# Patient Record
Sex: Male | Born: 1940 | Race: White | Hispanic: No | Marital: Married | Smoking: Never smoker
Health system: Southern US, Community
[De-identification: ages and names within clinical notes are randomized; demographics above are authoritative.]

## PROBLEM LIST (undated history)

## (undated) DIAGNOSIS — G8929 Other chronic pain: Secondary | ICD-10-CM

## (undated) DIAGNOSIS — C801 Malignant (primary) neoplasm, unspecified: Secondary | ICD-10-CM

## (undated) DIAGNOSIS — Z9989 Dependence on other enabling machines and devices: Secondary | ICD-10-CM

## (undated) DIAGNOSIS — K219 Gastro-esophageal reflux disease without esophagitis: Secondary | ICD-10-CM

## (undated) DIAGNOSIS — M549 Dorsalgia, unspecified: Secondary | ICD-10-CM

## (undated) DIAGNOSIS — F329 Major depressive disorder, single episode, unspecified: Secondary | ICD-10-CM

## (undated) DIAGNOSIS — F32A Depression, unspecified: Secondary | ICD-10-CM

## (undated) DIAGNOSIS — I10 Essential (primary) hypertension: Secondary | ICD-10-CM

## (undated) DIAGNOSIS — E119 Type 2 diabetes mellitus without complications: Secondary | ICD-10-CM

## (undated) DIAGNOSIS — Z8669 Personal history of other diseases of the nervous system and sense organs: Secondary | ICD-10-CM

## (undated) DIAGNOSIS — E785 Hyperlipidemia, unspecified: Secondary | ICD-10-CM

## (undated) DIAGNOSIS — R011 Cardiac murmur, unspecified: Secondary | ICD-10-CM

## (undated) DIAGNOSIS — R600 Localized edema: Secondary | ICD-10-CM

## (undated) DIAGNOSIS — N400 Enlarged prostate without lower urinary tract symptoms: Secondary | ICD-10-CM

## (undated) DIAGNOSIS — D649 Anemia, unspecified: Secondary | ICD-10-CM

## (undated) DIAGNOSIS — G4733 Obstructive sleep apnea (adult) (pediatric): Secondary | ICD-10-CM

## (undated) HISTORY — PX: CARPAL TUNNEL RELEASE: SHX101

## (undated) HISTORY — PX: TONSILLECTOMY: SUR1361

## (undated) HISTORY — PX: ROTATOR CUFF REPAIR: SHX139

## (undated) HISTORY — PX: BACK SURGERY: SHX140

## (undated) HISTORY — PX: POLYPECTOMY: SHX149

---

## 2007-07-08 ENCOUNTER — Encounter: Admission: RE | Admit: 2007-07-08 | Discharge: 2007-07-08 | Payer: Self-pay | Admitting: Gastroenterology

## 2008-11-10 ENCOUNTER — Encounter: Admission: RE | Admit: 2008-11-10 | Discharge: 2008-11-10 | Payer: Self-pay | Admitting: Internal Medicine

## 2008-12-28 ENCOUNTER — Emergency Department (HOSPITAL_COMMUNITY): Admission: EM | Admit: 2008-12-28 | Discharge: 2008-12-28 | Payer: Self-pay | Admitting: Emergency Medicine

## 2009-04-21 ENCOUNTER — Inpatient Hospital Stay (HOSPITAL_COMMUNITY): Admission: RE | Admit: 2009-04-21 | Discharge: 2009-04-22 | Payer: Self-pay | Admitting: Neurosurgery

## 2009-11-24 ENCOUNTER — Ambulatory Visit: Payer: Self-pay | Admitting: Vascular Surgery

## 2009-12-07 ENCOUNTER — Ambulatory Visit: Payer: Self-pay | Admitting: Vascular Surgery

## 2010-02-01 ENCOUNTER — Encounter
Admission: RE | Admit: 2010-02-01 | Discharge: 2010-02-01 | Payer: Self-pay | Source: Home / Self Care | Attending: Neurosurgery | Admitting: Neurosurgery

## 2010-04-18 LAB — CBC
HCT: 40.4 % (ref 39.0–52.0)
Hemoglobin: 13.8 g/dL (ref 13.0–17.0)
MCHC: 34 g/dL (ref 30.0–36.0)
MCV: 91.6 fL (ref 78.0–100.0)
Platelets: 275 10*3/uL (ref 150–400)
RBC: 4.42 MIL/uL (ref 4.22–5.81)
RDW: 13 % (ref 11.5–15.5)
WBC: 8.9 10*3/uL (ref 4.0–10.5)

## 2010-04-18 LAB — BASIC METABOLIC PANEL
BUN: 19 mg/dL (ref 6–23)
CO2: 26 mEq/L (ref 19–32)
Calcium: 8.8 mg/dL (ref 8.4–10.5)
Chloride: 104 mEq/L (ref 96–112)
Creatinine, Ser: 1.1 mg/dL (ref 0.4–1.5)
GFR calc Af Amer: >60 mL/min (ref >60)
GFR calc non Af Amer: >60 mL/min (ref >60)
Glucose, Bld: 113 mg/dL — ABNORMAL HIGH (ref 70–99)
Potassium: 4.1 mEq/L (ref 3.5–5.1)
Sodium: 139 mEq/L (ref 135–145)

## 2010-04-18 LAB — SURGICAL PCR SCREEN
MRSA, PCR: POSITIVE — AB
Staphylococcus aureus: POSITIVE — AB

## 2010-06-07 NOTE — Procedures (Signed)
LOWER EXTREMITY VENOUS REFLUX EXAM   INDICATION:  Varicose veins.   EXAM:  Using color-flow imaging and pulse Doppler spectral analysis, the  right common femoral, superficial femoral, popliteal, posterior tibial,  greater and lesser saphenous veins are evaluated.  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   The right saphenofemoral junction is competent.  The right GSV is  competent with the caliber as described below.   The right proximal short saphenous vein demonstrates competency.   GSV Diameter (used if found to be incompetent only)                                            Right    Left  Proximal Greater Saphenous Vein           0.39 cm  cm  Proximal-to-mid-thigh                     cm       cm  Mid thigh                                 0.19 cm  cm  Mid-distal thigh                          cm       cm  Distal thigh                              16 cm    cm  Knee                                      cm       cm   IMPRESSION:  1. Right greater saphenous vein is competent.  2. The right greater saphenous vein is not tortuous.  3. The deep venous system is not competent with reflux of      >552milliseconds within the superficial femoral vein proximally.  4. The right short saphenous vein is competent.   ___________________________________________  Larina Earthly, M.D.   OD/MEDQ  D:  11/24/2009  T:  11/24/2009  Job:  [id number]

## 2010-06-07 NOTE — Consult Note (Signed)
NEW PATIENT CONSULTATION   Antonio [name], Antonio [name]  DOB:  04-Oct-1940                                       12/07/2009  [id number]#:[id number]   The patient presents today for evaluation of right leg swelling.  He is  an otherwise healthy 70 year old gentleman who reports a several-month  history of progressive swelling in his right leg.  This is from his knee  down into his foot and reports this was much worse in the summertime and  actually had difficulty in tying his shoe due to the swelling.  He has  had some improvement since this time.  He is not on any diuretic  therapy.  He does not have any history of DVT.  He has some aching  sensation related to this but no specific pain.   PAST MEDICAL HISTORY:  Negative for cardiac disease.  He does have  elevated cholesterol.  He does have a history of lower extremity disk  disease and has had injections in his back for some relief of this.  He  does report that when he walks he describes a foot drop with walking.   SOCIAL HISTORY:  He is retired.  He is married.  He does not smoke or  drink alcohol.   REVIEW OF SYSTEMS:  No weight loss or gain, weighs 165 pounds.  He is 5  feet 1-1/2 inch tall.  He does have history of depression and otherwise  negative except for HPI.   PHYSICAL EXAM:  Well-nourished white male appearing stated age in no  acute distress.  Blood pressure is 161/84, heart rate 78, respirations  18.  HEENT:  Normal.  Chest:  Clear bilaterally.  His dorsalis pedis  pulses and posterior tibial pulses are 2+ bilaterally.  He does have  some mild pitting edema in the right leg, mild in the left leg.  He has  some small reticular varicosities in his right posterior calf.  Otherwise no varices.   He underwent noninvasive vascular __________  in our office on 11/02 and  I reviewed this with Mr. Breeding.  This shows no evidence of  superficial incompetence.  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  I discussed this at length with Mr. 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  I explained that with the deep system involvement that  there is no specific treatment.  I did explain the importance of  elevation and potential use for compression.  He reports that the  swelling is not significant enough that he wishes to proceed with this  at this time.  He understands that this is an option and we would  recommend knee-high, 20-30 mm graduated compression stockings should he  seek treatment for this.  Otherwise, he will see [place] on an as-needed  basis.     Larina Earthly, M.D.  Electronically Signed   TFE/MEDQ  D:  12/07/2009  T:  12/08/2009  Job:  [id number]   cc:   Theressa Millard, M.D.

## 2011-08-17 ENCOUNTER — Other Ambulatory Visit: Payer: Self-pay | Admitting: Neurosurgery

## 2011-08-17 DIAGNOSIS — M545 Low back pain, unspecified: Secondary | ICD-10-CM

## 2011-08-22 ENCOUNTER — Ambulatory Visit
Admission: RE | Admit: 2011-08-22 | Discharge: 2011-08-22 | Disposition: A | Discharge status: Home / Self Care | Payer: Medicare Other | Source: Ambulatory Visit | Attending: Neurosurgery | Admitting: Neurosurgery

## 2011-08-22 DIAGNOSIS — M545 Low back pain, unspecified: Secondary | ICD-10-CM

## 2011-08-22 MED ORDER — GADOBENATE DIMEGLUMINE 529 MG/ML IV SOLN
15.0000 mL | Freq: Once | INTRAVENOUS | Status: AC | PRN
  Administered 2011-08-22: 15 mL via INTRAVENOUS

## 2011-09-05 ENCOUNTER — Encounter (HOSPITAL_COMMUNITY): Payer: Self-pay

## 2011-09-05 ENCOUNTER — Encounter (HOSPITAL_COMMUNITY): Payer: Self-pay | Admitting: Respiratory Therapy

## 2011-09-05 ENCOUNTER — Other Ambulatory Visit: Payer: Self-pay | Admitting: Neurosurgery

## 2011-09-05 NOTE — Progress Notes (Signed)
Contacted Dr. Earl Gala office, left message for Darl Pikes preop orders needed.

## 2011-09-06 ENCOUNTER — Inpatient Hospital Stay (HOSPITAL_COMMUNITY): Payer: Medicare Other

## 2011-09-06 ENCOUNTER — Encounter (HOSPITAL_COMMUNITY): Payer: Self-pay | Admitting: Anesthesiology

## 2011-09-06 ENCOUNTER — Inpatient Hospital Stay (HOSPITAL_COMMUNITY)
Admission: RE | Admit: 2011-09-06 | Discharge: 2011-09-08 | DRG: 491 | Disposition: A | Discharge status: Home / Self Care | Payer: Medicare Other | Source: Ambulatory Visit | Attending: Neurosurgery | Admitting: Neurosurgery

## 2011-09-06 ENCOUNTER — Inpatient Hospital Stay (HOSPITAL_COMMUNITY): Payer: Medicare Other | Admitting: Anesthesiology

## 2011-09-06 ENCOUNTER — Encounter (HOSPITAL_COMMUNITY)
Admission: RE | Disposition: A | Discharge status: Home / Self Care | Payer: Self-pay | Source: Ambulatory Visit | Attending: Neurosurgery

## 2011-09-06 DIAGNOSIS — Z85828 Personal history of other malignant neoplasm of skin: Secondary | ICD-10-CM

## 2011-09-06 DIAGNOSIS — F3289 Other specified depressive episodes: Secondary | ICD-10-CM | POA: Diagnosis present

## 2011-09-06 DIAGNOSIS — M5137 Other intervertebral disc degeneration, lumbosacral region: Secondary | ICD-10-CM | POA: Diagnosis present

## 2011-09-06 DIAGNOSIS — M51379 Other intervertebral disc degeneration, lumbosacral region without mention of lumbar back pain or lower extremity pain: Secondary | ICD-10-CM | POA: Diagnosis present

## 2011-09-06 DIAGNOSIS — K219 Gastro-esophageal reflux disease without esophagitis: Secondary | ICD-10-CM | POA: Diagnosis present

## 2011-09-06 DIAGNOSIS — M47817 Spondylosis without myelopathy or radiculopathy, lumbosacral region: Secondary | ICD-10-CM | POA: Diagnosis present

## 2011-09-06 DIAGNOSIS — Z85820 Personal history of malignant melanoma of skin: Secondary | ICD-10-CM

## 2011-09-06 DIAGNOSIS — M5126 Other intervertebral disc displacement, lumbar region: Principal | ICD-10-CM | POA: Diagnosis present

## 2011-09-06 DIAGNOSIS — F329 Major depressive disorder, single episode, unspecified: Secondary | ICD-10-CM | POA: Diagnosis present

## 2011-09-06 HISTORY — DX: Major depressive disorder, single episode, unspecified: F32.9

## 2011-09-06 HISTORY — DX: Malignant (primary) neoplasm, unspecified: C80.1

## 2011-09-06 HISTORY — DX: Other chronic pain: G89.29

## 2011-09-06 HISTORY — DX: Depression, unspecified: F32.A

## 2011-09-06 HISTORY — DX: Dorsalgia, unspecified: M54.9

## 2011-09-06 HISTORY — PX: LUMBAR LAMINECTOMY/DECOMPRESSION MICRODISCECTOMY: SHX5026

## 2011-09-06 HISTORY — DX: Gastro-esophageal reflux disease without esophagitis: K21.9

## 2011-09-06 HISTORY — DX: Hyperlipidemia, unspecified: E78.5

## 2011-09-06 LAB — CBC
HCT: 24.1 % — ABNORMAL LOW (ref 39.0–52.0)
Hemoglobin: 7.4 g/dL — ABNORMAL LOW (ref 13.0–17.0)
MCH: 24.3 pg — ABNORMAL LOW (ref 26.0–34.0)
MCHC: 30.7 g/dL (ref 30.0–36.0)
MCV: 79.3 fL (ref 78.0–100.0)
Platelets: 286 10*3/uL (ref 150–400)
RBC: 3.04 MIL/uL — ABNORMAL LOW (ref 4.22–5.81)
RDW: 15.7 % — ABNORMAL HIGH (ref 11.5–15.5)
WBC: 8.5 10*3/uL (ref 4.0–10.5)

## 2011-09-06 LAB — SURGICAL PCR SCREEN
MRSA, PCR: NEGATIVE
Staphylococcus aureus: POSITIVE — AB

## 2011-09-06 SURGERY — LUMBAR LAMINECTOMY/DECOMPRESSION MICRODISCECTOMY 1 LEVEL
Anesthesia: General | Site: Back | Laterality: Left | Wound class: Clean

## 2011-09-06 MED ORDER — GLYCOPYRROLATE 0.2 MG/ML IJ SOLN
INTRAMUSCULAR | Status: DC | PRN
  Administered 2011-09-06: 0.2 mg via INTRAVENOUS
  Administered 2011-09-06: .3 mg via INTRAVENOUS

## 2011-09-06 MED ORDER — EPHEDRINE SULFATE 50 MG/ML IJ SOLN: INTRAMUSCULAR | Status: DC | PRN

## 2011-09-06 MED ORDER — ALUM & MAG HYDROXIDE-SIMETH 200-200-20 MG/5ML PO SUSP: 30.0000 mL | Freq: Four times a day (QID) | ORAL | Status: DC | PRN

## 2011-09-06 MED ORDER — PHENOL 1.4 % MT LIQD: 1.0000 | OROMUCOSAL | Status: DC | PRN

## 2011-09-06 MED ORDER — 0.9 % SODIUM CHLORIDE (POUR BTL) OPTIME
TOPICAL | Status: DC | PRN
  Administered 2011-09-06: 1000 mL

## 2011-09-06 MED ORDER — HYDROXYZINE HCL 50 MG/ML IM SOLN
50.0000 mg | INTRAMUSCULAR | Status: DC | PRN
  Administered 2011-09-06: 50 mg via INTRAMUSCULAR
  Filled 2011-09-06: qty 1

## 2011-09-06 MED ORDER — PROPOFOL 10 MG/ML IV EMUL
INTRAVENOUS | Status: DC | PRN
  Administered 2011-09-06: 200 mg via INTRAVENOUS

## 2011-09-06 MED ORDER — MORPHINE SULFATE 4 MG/ML IJ SOLN: 4.0000 mg | INTRAMUSCULAR | Status: DC | PRN

## 2011-09-06 MED ORDER — BUPIVACAINE HCL (PF) 0.5 % IJ SOLN
INTRAMUSCULAR | Status: DC | PRN
  Administered 2011-09-06: 10 mL

## 2011-09-06 MED ORDER — ONDANSETRON HCL 4 MG/2ML IJ SOLN: 4.0000 mg | Freq: Once | INTRAMUSCULAR | Status: DC | PRN

## 2011-09-06 MED ORDER — CYCLOBENZAPRINE HCL 10 MG PO TABS: 10.0000 mg | ORAL_TABLET | Freq: Three times a day (TID) | ORAL | Status: DC | PRN

## 2011-09-06 MED ORDER — ROCURONIUM BROMIDE 100 MG/10ML IV SOLN
INTRAVENOUS | Status: DC | PRN
  Administered 2011-09-06: 50 mg via INTRAVENOUS

## 2011-09-06 MED ORDER — OXYCODONE-ACETAMINOPHEN 5-325 MG PO TABS: 1.0000 | ORAL_TABLET | ORAL | Status: DC | PRN

## 2011-09-06 MED ORDER — TAMSULOSIN HCL 0.4 MG PO CAPS
0.4000 mg | ORAL_CAPSULE | ORAL | Status: AC
  Administered 2011-09-06: 0.4 mg via ORAL
  Filled 2011-09-06: qty 1

## 2011-09-06 MED ORDER — ACETAMINOPHEN 10 MG/ML IV SOLN
INTRAVENOUS | Status: AC
  Filled 2011-09-06: qty 100

## 2011-09-06 MED ORDER — KETOROLAC TROMETHAMINE 30 MG/ML IJ SOLN
INTRAMUSCULAR | Status: AC
  Filled 2011-09-06: qty 1

## 2011-09-06 MED ORDER — HEMOSTATIC AGENTS (NO CHARGE) OPTIME
TOPICAL | Status: DC | PRN
  Administered 2011-09-06: 1 via TOPICAL

## 2011-09-06 MED ORDER — ACETAMINOPHEN 325 MG PO TABS: 650.0000 mg | ORAL_TABLET | ORAL | Status: DC | PRN

## 2011-09-06 MED ORDER — SODIUM CHLORIDE 0.9 % IJ SOLN: 3.0000 mL | INTRAMUSCULAR | Status: DC | PRN

## 2011-09-06 MED ORDER — PANTOPRAZOLE SODIUM 40 MG PO TBEC
40.0000 mg | DELAYED_RELEASE_TABLET | Freq: Every day | ORAL | Status: DC
  Administered 2011-09-06 – 2011-09-08 (×3): 40 mg via ORAL
  Filled 2011-09-06 (×4): qty 1

## 2011-09-06 MED ORDER — OXYCODONE HCL 5 MG PO TABS: 5.0000 mg | ORAL_TABLET | ORAL | Status: DC | PRN

## 2011-09-06 MED ORDER — TAMSULOSIN HCL 0.4 MG PO CAPS
0.4000 mg | ORAL_CAPSULE | Freq: Every day | ORAL | Status: DC
  Administered 2011-09-07 – 2011-09-08 (×2): 0.4 mg via ORAL
  Filled 2011-09-06 (×3): qty 1

## 2011-09-06 MED ORDER — DEXTROSE 5 % IV SOLN
INTRAVENOUS | Status: DC | PRN
  Administered 2011-09-06: 13:00:00 via INTRAVENOUS

## 2011-09-06 MED ORDER — LACTATED RINGERS IV SOLN
INTRAVENOUS | Status: DC | PRN
  Administered 2011-09-06 (×2): via INTRAVENOUS

## 2011-09-06 MED ORDER — FENTANYL CITRATE 0.05 MG/ML IJ SOLN
INTRAMUSCULAR | Status: AC
  Filled 2011-09-06: qty 2

## 2011-09-06 MED ORDER — ZOLPIDEM TARTRATE 5 MG PO TABS: 5.0000 mg | ORAL_TABLET | Freq: Every evening | ORAL | Status: DC | PRN

## 2011-09-06 MED ORDER — LIDOCAINE-EPINEPHRINE 1 %-1:100000 IJ SOLN
INTRAMUSCULAR | Status: DC | PRN
  Administered 2011-09-06: 10 mL

## 2011-09-06 MED ORDER — EPHEDRINE SULFATE 50 MG/ML IJ SOLN
INTRAMUSCULAR | Status: DC | PRN
  Administered 2011-09-06 (×4): 5 mg via INTRAVENOUS

## 2011-09-06 MED ORDER — BACITRACIN 50000 UNITS IM SOLR
INTRAMUSCULAR | Status: AC
  Filled 2011-09-06: qty 1

## 2011-09-06 MED ORDER — CEFAZOLIN SODIUM-DEXTROSE 2-3 GM-% IV SOLR
INTRAVENOUS | Status: AC
  Filled 2011-09-06: qty 50

## 2011-09-06 MED ORDER — SODIUM CHLORIDE 0.9 % IR SOLN
Status: DC | PRN
  Administered 2011-09-06: 12:00:00

## 2011-09-06 MED ORDER — MENTHOL 3 MG MT LOZG: 1.0000 | LOZENGE | OROMUCOSAL | Status: DC | PRN

## 2011-09-06 MED ORDER — FENTANYL CITRATE 0.05 MG/ML IJ SOLN
INTRAMUSCULAR | Status: DC | PRN
  Administered 2011-09-06: 100 ug via INTRAVENOUS

## 2011-09-06 MED ORDER — ONDANSETRON HCL 4 MG/2ML IJ SOLN
INTRAMUSCULAR | Status: DC | PRN
  Administered 2011-09-06: 4 mg via INTRAVENOUS

## 2011-09-06 MED ORDER — KETOROLAC TROMETHAMINE 30 MG/ML IJ SOLN
15.0000 mg | Freq: Once | INTRAMUSCULAR | Status: AC
  Administered 2011-09-06: 15 mg via INTRAVENOUS

## 2011-09-06 MED ORDER — MUPIROCIN 2 % EX OINT
TOPICAL_OINTMENT | Freq: Two times a day (BID) | CUTANEOUS | Status: DC
  Administered 2011-09-06 – 2011-09-08 (×4): via NASAL
  Filled 2011-09-06: qty 22

## 2011-09-06 MED ORDER — ACETAMINOPHEN 10 MG/ML IV SOLN
1000.0000 mg | Freq: Four times a day (QID) | INTRAVENOUS | Status: AC
  Administered 2011-09-06 – 2011-09-07 (×4): 1000 mg via INTRAVENOUS
  Filled 2011-09-06 (×4): qty 100

## 2011-09-06 MED ORDER — HYDROCODONE-ACETAMINOPHEN 5-325 MG PO TABS: 1.0000 | ORAL_TABLET | ORAL | Status: DC | PRN

## 2011-09-06 MED ORDER — HYDROMORPHONE HCL PF 1 MG/ML IJ SOLN: 0.2500 mg | INTRAMUSCULAR | Status: DC | PRN

## 2011-09-06 MED ORDER — SODIUM CHLORIDE 0.9 % IV SOLN
INTRAVENOUS | Status: AC
  Filled 2011-09-06: qty 500

## 2011-09-06 MED ORDER — MUPIROCIN 2 % EX OINT
TOPICAL_OINTMENT | CUTANEOUS | Status: AC
  Administered 2011-09-06: 1 via NASAL
  Filled 2011-09-06: qty 22

## 2011-09-06 MED ORDER — MAGNESIUM HYDROXIDE 400 MG/5ML PO SUSP: 30.0000 mL | Freq: Every day | ORAL | Status: DC | PRN

## 2011-09-06 MED ORDER — VANCOMYCIN HCL 1000 MG IV SOLR
1000.0000 mg | INTRAVENOUS | Status: DC | PRN
  Administered 2011-09-06: 1000 mg via INTRAVENOUS

## 2011-09-06 MED ORDER — MUPIROCIN 2 % EX OINT
TOPICAL_OINTMENT | CUTANEOUS | Status: AC
  Filled 2011-09-06: qty 22

## 2011-09-06 MED ORDER — CEFAZOLIN SODIUM-DEXTROSE 2-3 GM-% IV SOLR
2.0000 g | INTRAVENOUS | Status: AC
  Administered 2011-09-06: 2 g via INTRAVENOUS

## 2011-09-06 MED ORDER — KETOROLAC TROMETHAMINE 30 MG/ML IJ SOLN
15.0000 mg | Freq: Four times a day (QID) | INTRAMUSCULAR | Status: DC
  Administered 2011-09-06 – 2011-09-08 (×7): 15 mg via INTRAVENOUS
  Filled 2011-09-06 (×10): qty 1

## 2011-09-06 MED ORDER — KCL IN DEXTROSE-NACL 20-5-0.45 MEQ/L-%-% IV SOLN
INTRAVENOUS | Status: DC
  Administered 2011-09-06: 16:00:00 via INTRAVENOUS
  Filled 2011-09-06 (×6): qty 1000

## 2011-09-06 MED ORDER — FENTANYL CITRATE 0.05 MG/ML IJ SOLN
INTRAMUSCULAR | Status: AC
  Administered 2011-09-06: 50 ug
  Filled 2011-09-06: qty 2

## 2011-09-06 MED ORDER — SODIUM CHLORIDE 0.9 % IJ SOLN
3.0000 mL | Freq: Two times a day (BID) | INTRAMUSCULAR | Status: DC
  Administered 2011-09-06 – 2011-09-08 (×5): 3 mL via INTRAVENOUS

## 2011-09-06 MED ORDER — LIDOCAINE HCL (CARDIAC) 20 MG/ML IV SOLN
INTRAVENOUS | Status: DC | PRN
  Administered 2011-09-06: 100 mg via INTRAVENOUS

## 2011-09-06 MED ORDER — BUPROPION HCL ER (XL) 300 MG PO TB24
450.0000 mg | ORAL_TABLET | Freq: Every day | ORAL | Status: DC
  Administered 2011-09-07 – 2011-09-08 (×2): 450 mg via ORAL
  Filled 2011-09-06 (×3): qty 1

## 2011-09-06 MED ORDER — HYDROXYZINE HCL 25 MG PO TABS: 50.0000 mg | ORAL_TABLET | ORAL | Status: DC | PRN

## 2011-09-06 MED ORDER — ACETAMINOPHEN 650 MG RE SUPP: 650.0000 mg | RECTAL | Status: DC | PRN

## 2011-09-06 MED ORDER — NEOSTIGMINE METHYLSULFATE 1 MG/ML IJ SOLN
INTRAMUSCULAR | Status: DC | PRN
  Administered 2011-09-06: 1 mg via INTRAVENOUS
  Administered 2011-09-06: 2 mg via INTRAVENOUS

## 2011-09-06 MED ORDER — VANCOMYCIN HCL IN DEXTROSE 1-5 GM/200ML-% IV SOLN
INTRAVENOUS | Status: AC
  Filled 2011-09-06: qty 200

## 2011-09-06 MED ORDER — BISACODYL 10 MG RE SUPP: 10.0000 mg | Freq: Every day | RECTAL | Status: DC | PRN

## 2011-09-06 MED ORDER — THROMBIN 5000 UNITS EX SOLR
CUTANEOUS | Status: DC | PRN
  Administered 2011-09-06 (×2): 5000 [IU] via TOPICAL

## 2011-09-06 MED ORDER — METHYLPREDNISOLONE ACETATE 80 MG/ML IJ SUSP
INTRAMUSCULAR | Status: DC | PRN
  Administered 2011-09-06: 80 mg

## 2011-09-06 SURGICAL SUPPLY — 61 items
ADH SKN CLS APL DERMABOND .7 (GAUZE/BANDAGES/DRESSINGS) ×1
APL SKNCLS STERI-STRIP NONHPOA (GAUZE/BANDAGES/DRESSINGS)
BAG DECANTER FOR FLEXI CONT (MISCELLANEOUS) ×2 IMPLANT
BENZOIN TINCTURE PRP APPL 2/3 (GAUZE/BANDAGES/DRESSINGS) IMPLANT
BLADE SURG ROTATE 9660 (MISCELLANEOUS) IMPLANT
BRUSH SCRUB EZ PLAIN DRY (MISCELLANEOUS) ×2 IMPLANT
BUR ACORN 6.0 ACORN (BURR) IMPLANT
BUR ACRON 5.0MM COATED (BURR) IMPLANT
BUR MATCHSTICK NEURO 3.0 LAGG (BURR) ×2 IMPLANT
CANISTER SUCTION 2500CC (MISCELLANEOUS) ×2 IMPLANT
CLOTH BEACON ORANGE TIMEOUT ST (SAFETY) ×2 IMPLANT
CONT SPEC 4OZ CLIKSEAL STRL BL (MISCELLANEOUS) ×1 IMPLANT
DERMABOND ADVANCED (GAUZE/BANDAGES/DRESSINGS) ×1
DERMABOND ADVANCED .7 DNX12 (GAUZE/BANDAGES/DRESSINGS) IMPLANT
DRAPE LAPAROTOMY 100X72X124 (DRAPES) ×2 IMPLANT
DRAPE MICROSCOPE LEICA (MISCELLANEOUS) ×2 IMPLANT
DRAPE POUCH INSTRU U-SHP 10X18 (DRAPES) ×2 IMPLANT
DRAPE PROXIMA HALF (DRAPES) ×1 IMPLANT
DRSG EMULSION OIL 3X3 NADH (GAUZE/BANDAGES/DRESSINGS) IMPLANT
ELECT REM PT RETURN 9FT ADLT (ELECTROSURGICAL) ×2
ELECTRODE REM PT RTRN 9FT ADLT (ELECTROSURGICAL) ×1 IMPLANT
GAUZE SPONGE 4X4 16PLY XRAY LF (GAUZE/BANDAGES/DRESSINGS) IMPLANT
GLOVE BIOGEL PI IND STRL 7.0 (GLOVE) IMPLANT
GLOVE BIOGEL PI IND STRL 8 (GLOVE) ×1 IMPLANT
GLOVE BIOGEL PI INDICATOR 7.0 (GLOVE) ×1
GLOVE BIOGEL PI INDICATOR 8 (GLOVE) ×1
GLOVE ECLIPSE 7.5 STRL STRAW (GLOVE) ×2 IMPLANT
GLOVE EXAM NITRILE LRG STRL (GLOVE) IMPLANT
GLOVE EXAM NITRILE MD LF STRL (GLOVE) ×1 IMPLANT
GLOVE EXAM NITRILE XL STR (GLOVE) IMPLANT
GLOVE EXAM NITRILE XS STR PU (GLOVE) IMPLANT
GOWN BRE IMP SLV AUR LG STRL (GOWN DISPOSABLE) ×2 IMPLANT
GOWN BRE IMP SLV AUR XL STRL (GOWN DISPOSABLE) ×1 IMPLANT
GOWN STRL REIN 2XL LVL4 (GOWN DISPOSABLE) ×1 IMPLANT
KIT BASIN OR (CUSTOM PROCEDURE TRAY) ×2 IMPLANT
KIT ROOM TURNOVER OR (KITS) ×2 IMPLANT
NDL HYPO 18GX1.5 BLUNT FILL (NEEDLE) IMPLANT
NDL SPNL 18GX3.5 QUINCKE PK (NEEDLE) ×1 IMPLANT
NDL SPNL 22GX3.5 QUINCKE BK (NEEDLE) IMPLANT
NEEDLE HYPO 18GX1.5 BLUNT FILL (NEEDLE) ×4 IMPLANT
NEEDLE SPNL 18GX3.5 QUINCKE PK (NEEDLE) ×4 IMPLANT
NEEDLE SPNL 22GX3.5 QUINCKE BK (NEEDLE) ×2 IMPLANT
NS IRRIG 1000ML POUR BTL (IV SOLUTION) ×2 IMPLANT
PACK LAMINECTOMY NEURO (CUSTOM PROCEDURE TRAY) ×2 IMPLANT
PAD ARMBOARD 7.5X6 YLW CONV (MISCELLANEOUS) ×6 IMPLANT
PATTIES SURGICAL .5 X1 (DISPOSABLE) ×2 IMPLANT
RUBBERBAND STERILE (MISCELLANEOUS) ×4 IMPLANT
SPONGE GAUZE 4X4 12PLY (GAUZE/BANDAGES/DRESSINGS) IMPLANT
SPONGE LAP 4X18 X RAY DECT (DISPOSABLE) IMPLANT
SPONGE SURGIFOAM ABS GEL SZ50 (HEMOSTASIS) ×2 IMPLANT
STRIP CLOSURE SKIN 1/2X4 (GAUZE/BANDAGES/DRESSINGS) IMPLANT
SUT PROLENE 6 0 BV (SUTURE) IMPLANT
SUT VIC AB 1 CT1 18XBRD ANBCTR (SUTURE) ×1 IMPLANT
SUT VIC AB 1 CT1 8-18 (SUTURE) ×2
SUT VIC AB 2-0 CP2 18 (SUTURE) ×2 IMPLANT
SUT VIC AB 3-0 SH 8-18 (SUTURE) IMPLANT
SYR 20CC LL (SYRINGE) ×2 IMPLANT
SYR 5ML LL (SYRINGE) ×1 IMPLANT
TOWEL OR 17X24 6PK STRL BLUE (TOWEL DISPOSABLE) ×2 IMPLANT
TOWEL OR 17X26 10 PK STRL BLUE (TOWEL DISPOSABLE) ×2 IMPLANT
WATER STERILE IRR 1000ML POUR (IV SOLUTION) ×2 IMPLANT

## 2011-09-06 NOTE — Progress Notes (Signed)
Filed Vitals:   09/06/11 1430 09/06/11 1553 09/06/11 1841 09/06/11 2002  BP: 196/98 187/76 151/77 157/72  Pulse: 106 75 82 88  Temp: 97.4 F (36.3 C) 97.4 F (36.3 C)  97.6 F (36.4 C)  TempSrc:      Resp: 16 16  18   Height:      Weight:      SpO2: 97% 96%  96%    CBC  Basename 09/06/11 0955  WBC 8.5  HGB 7.4*  HCT 24.1*  PLT 286    Patient resting comfortably in bed. Excellent relief of left lumbar radicular pain. Has ambulated in the halls. Was voiding small volumes this afternoon, bladder scan showed large-volume, and in and out catheterization for 1050 cc done in about 1630. Patient voided about 150 cc at 2200, bladder scan now shows 615 cc. Wound clean and dry. Good strength in lower extremities.   Plan: Patient with excellent relief of radicular pain following lumbar discectomy. However having difficulties with urinary retention, he may well have a tendency towards this prior to surgery. I've asked the nursing staff to place a Foley catheter to straight drainage to rest his bladder. We will give him a dose of Flomax tonight, and then begin every morning dosing. We'll reassess in a.m.   Hewitt Shorts, MD 09/06/2011, 10:37 PM

## 2011-09-06 NOTE — H&P (Signed)
Subjective: Patient is a 71 y.o. male who is admitted for treatment of an acute left lumbar radiculopathy secondary to an acute left L3-4 lumbar disc herniation that has extended caudally behind the body of L4 on the left side. We had been managed in the patient for low back pain, he had previously demonstrated stenosis at the L3-4 level, multifactorial in nature, as well as right L4-5 and right L5-S1 neural foraminal stenosis, secondary to spondylosis.  However he fell about a month ago landing on his left knee and since then has been having left lumbar radicular pain. He's restudied with MRI scan, 4 to the most notable finding was the new left L3-4 disc herniation, with a fragment migrated caudally behind the body of L4 on the left side.  He is admitted now for a left L3-4 lumbar laminotomy and microdiscectomy.   Past Medical History  Diagnosis Date  . Hyperlipidemia     "borderline"  . Depression   . GERD (gastroesophageal reflux disease)   . 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 Chronic back pain     Past Surgical History  Procedure Date  . Tonsillectomy   . Back surgery     No prescriptions prior to admission   No Known Allergies  History  Substance Use Topics  . Smoking status: Never Smoker   . Smokeless tobacco: Not on file  . Alcohol Use: No    History reviewed. No pertinent family history.   Review of Systems A comprehensive review of systems was negative.  Objective: Vital signs in last 24 hours: Weight:  [72.576 kg (160 lb)] 72.576 kg (160 lb) (08/13 1557)  EXAM: Patient is a well-developed well-nourished white male in no acute distress. Lungs are clear to auscultation , the patient has symmetrical respiratory excursion. Heart has a regular rate and rhythm normal S1 and S2 no murmur.   Abdomen is soft nontender nondistended bowel sounds are present. Extremity examination shows no clubbing cyanosis or edema. Musculoskeletal  examination shows no tenderness to palpation over the lumbar spinous processes or paralumbar musculature. He is able to flex to about 80 able to extend beyond 10. Straight leg raising is negative bilaterally. Motor examination shows 5 over 5 strength in the lower extremities including the iliopsoas quadriceps dorsiflexor extensor hallicus  longus and plantar flexor bilaterally. Sensation is intact to pinprick in the distal lower extremities. Reflexes are symmetrical bilaterally. No pathologic reflexes are present. Patient has a normal gait and stance.   Data Review:CBC    Component Value Date/Time   WBC 8.9 04/15/2009 1159   RBC 4.42 04/15/2009 1159   HGB 13.8 04/15/2009 1159   HCT 40.4 04/15/2009 1159   PLT 275 04/15/2009 1159   MCV 91.6 04/15/2009 1159   MCHC 34.0 04/15/2009 1159   RDW 13.0 04/15/2009 1159                          BMET    Component Value Date/Time   NA 139 04/15/2009 1159   K 4.1 04/15/2009 1159   CL 104 04/15/2009 1159   CO2 26 04/15/2009 1159   GLUCOSE 113* 04/15/2009 1159   BUN 19 04/15/2009 1159   CREATININE 1.10 04/15/2009 1159   CALCIUM 8.8 04/15/2009 1159   GFRNONAA >60 04/15/2009 1159   GFRAA  Value: >60        The eGFR has been calculated using the MDRD equation. This  calculation has not been validated in all clinical situations. eGFR's persistently <60 mL/min signify possible Chronic Kidney Disease. 04/15/2009 1159     Assessment/Plan: Patient with acute left lumbar radiculopathy secondary to an acute left L3-4 lumbar disc herniation with a fragment has migrated caudally behind the left side of the body of L4. Patient is admitted now for left L3-4 lumbar laminotomy and microdiscectomy. I've discussed with the patient the nature of his condition, the nature the surgical procedure, the typical length of surgery, hospital stay, and overall recuperation. We discussed limitations postoperatively. I discussed risks of surgery including risks of infection, bleeding, possibly  need for transfusion, the risk of nerve root dysfunction with pain, weakness, numbness, or paresthesias, or risk of dural tear and CSF leakage and possible need for further surgery, the risk of recurrent disc herniation and the possible need for further surgery, and the risk of anesthetic complications including myocardial infarction, stroke, pneumonia, and death. Understanding all this the patient does wish to proceed with surgery and is admitted for such.    Hewitt Shorts, MD 09/06/2011 8:33 AM

## 2011-09-06 NOTE — Anesthesia Postprocedure Evaluation (Signed)
  Anesthesia Post-op Note  Patient: Antonio [name]  Procedure(s) Performed: Procedure(s) (LRB): LUMBAR LAMINECTOMY/DECOMPRESSION MICRODISCECTOMY 1 LEVEL (Left)  Patient Location: PACU  Anesthesia Type: General  Level of Consciousness: awake, alert , oriented and patient cooperative  Airway and Oxygen Therapy: Patient Spontanous Breathing and Patient connected to nasal cannula oxygen  Post-op Pain: mild  Post-op Assessment: Post-op Vital signs reviewed, Patient's Cardiovascular Status Stable, Respiratory Function Stable, Patent Airway, No signs of Nausea or vomiting and Pain level controlled  Post-op Vital Signs: stable  Complications: No apparent anesthesia complications

## 2011-09-06 NOTE — Transfer of Care (Signed)
Immediate Anesthesia Transfer of Care Note  Patient: Antonio [name]  Procedure(s) Performed: Procedure(s) (LRB): LUMBAR LAMINECTOMY/DECOMPRESSION MICRODISCECTOMY 1 LEVEL (Left)  Patient Location: PACU  Anesthesia Type: General  Level of Consciousness: patient cooperative and responds to stimulation  Airway & Oxygen Therapy: Patient Spontanous Breathing and Patient connected to nasal cannula oxygen  Post-op Assessment: Report given to PACU RN and Post -op Vital signs reviewed and stable  Post vital signs: Reviewed and stable  Complications: No apparent anesthesia complications

## 2011-09-06 NOTE — Plan of Care (Signed)
Problem: Consults Goal: Diagnosis - Spinal Surgery Outcome: Completed/Met Date Met:  09/06/11 Lumbar Laminectomy (Complex)

## 2011-09-06 NOTE — Preoperative (Signed)
Beta Blockers   Reason not to administer Beta Blockers:Not Applicable 

## 2011-09-06 NOTE — Op Note (Signed)
09/06/2011  1:15 PM  PATIENT:  Antonio [name]  71 y.o. male  PRE-OPERATIVE DIAGNOSIS:  Left L3-4 Lumbar herniated nucleus pulposus without myelopathy, Lumbar stenosis, Lumbar spondylosis, Lumbar degenerative disc disease  POST-OPERATIVE DIAGNOSIS:  Left L34 Lumbar herniated nucleus pulposus without myelopathy, Lumbar stenosis, Lumbar spondylosis, Lumbar degenerative disc disease  PROCEDURE:  Procedure(s): LUMBAR LAMINECTOMY/DECOMPRESSION MICRODISCECTOMY 1 LEVEL: Left L3-4 lumbar laminotomy and microdiscectomy, with microdissection, microsurgical technique, and the operating microscope  SURGEON:  Surgeon(s): Hewitt Shorts, MD  ANESTHESIA:   general  EBL:  Total I/O In: 1300 [I.V.:1300] Out: -   COUNT: Correct per nursing staff  DICTATION: Patient was brought to the operating room and placed under general endotracheal anesthesia. Patient was turned to prone position the lumbar region was prepped with Betadine soap and solution and draped in a sterile fashion. The midline was infiltrated with local anesthetic with epinephrine. A localizing x-ray was taken and the L3-4 level was identified. Midline incision was made over the L3-4 level and was carried down through the subcutaneous tissue to the lumbar fascia. The lumbar fascia was incised on the left side and the paraspinal muscles were dissected from the spinous processes and lamina in a subperiosteal fashion. Another x-ray was taken and the left L3-4 intralaminar space was identified. The operating microscope was draped and brought into the field provided additional magnification, illumination, and visualization and the remainder the decompression was performed using microdissection and microsurgical technique. Laminotomy was performed using the high-speed drill and Kerrison punches. The ligamentum flavum was carefully resected. Spondylitic overgrowth and thickened ligamentum flavum was removed, decompressing the dorsal aspect of the  patient's pre-existing spinal stenosis on the left side at the L3-4 level. The underlying thecal sac and nerve root were identified. The disc herniation was identified and the thecal sac and nerve root gently retracted medially. The disc herniation was found to be a free fragment located inferior to the L3-4 disc space, behind the body of L4, directly compressing the thecal sac and the left L4 nerve root. The fragment was removed in a piecemeal fashion. All loose fragments of disc material were removed from the epidural space, and good decompression of the thecal sac and nerve root was achieved. We examined the annulus of the L3-4 disc, no obvious rent in the annulus was found, it was felt that there would be no further benefit from opening the annulus and entering the disc space to continue a more extensive intradiscal discectomy. Once the decompression was completed and good decompression of the thecal sac and nerve had been achieved hemostasis was established with the use of bipolar cautery and Gelfoam with thrombin. The Gelfoam was removed and hemostasis confirmed. We then instilled 2 cc of fentanyl and 80 mg of Depo-Medrol into the epidural space. Deep fascia was closed with interrupted undyed 1 Vicryl sutures. Scarpa's fascia was closed with interrupted undyed 1 Vicryl sutures in the subcutaneous and subcuticular layer were closed with interrupted inverted 2-0 undyed Vicryl sutures. The skin edges were approximated with Dermabond. Following surgery the patient was turned back to a supine position to be reversed from the anesthetic extubated and transferred to the recovery room for further care.   PLAN OF CARE: Admit to inpatient   PATIENT DISPOSITION:  PACU - hemodynamically stable.   Delay start of Pharmacological VTE agent (>24hrs) due to surgical blood loss or risk of bleeding:  yes

## 2011-09-06 NOTE — Progress Notes (Signed)
UR COMPLETED  

## 2011-09-06 NOTE — Anesthesia Preprocedure Evaluation (Addendum)
Anesthesia Evaluation  Patient identified by MRN, date of birth, ID band Patient awake    Reviewed: Allergy & Precautions, H&P , NPO status , Patient's Chart, lab work & pertinent test results  Airway Mallampati: II TM Distance: >3 FB Neck ROM: full    Dental  (+) Dental Advisory Given and Chipped,    Pulmonary          Cardiovascular Rhythm:regular Rate:Normal     Neuro/Psych    GI/Hepatic GERD-  ,  Endo/Other    Renal/GU      Musculoskeletal   Abdominal   Peds  Hematology   Anesthesia Other Findings   Reproductive/Obstetrics                          Anesthesia Physical Anesthesia Plan  ASA: I  Anesthesia Plan: General   Post-op Pain Management:    Induction: Intravenous  Airway Management Planned: Oral ETT  Additional Equipment:   Intra-op Plan:   Post-operative Plan: Extubation in OR  Informed Consent: I have reviewed the patients History and Physical, chart, labs and discussed the procedure including the risks, benefits and alternatives for the proposed anesthesia with the patient or authorized representative who has indicated his/her understanding and acceptance.     Plan Discussed with: CRNA, Anesthesiologist and Surgeon  Anesthesia Plan Comments:         Anesthesia Quick Evaluation

## 2011-09-07 ENCOUNTER — Encounter (HOSPITAL_COMMUNITY): Payer: Self-pay | Admitting: Neurosurgery

## 2011-09-07 NOTE — Progress Notes (Signed)
Filed Vitals:   09/06/11 2002 09/06/11 2345 09/07/11 0400 09/07/11 0815  BP: 157/72 179/70 127/69 156/71  Pulse: 88 82 83 79  Temp: 97.6 F (36.4 C) 98.9 F (37.2 C) 97.8 F (36.6 C) 98 F (36.7 C)  TempSrc:      Resp: 18 18 16 18   Height:      Weight:      SpO2: 96% 97% 94% 97%    Patient resting comfortably in bed. Wound clean and dry. Up and ambulating. Foley to straight drainage. Continues on Flomax.  Plan: Discussed alternatives of voiding trial tomorrow morning versus discharged to home leaving Foley in with a leg bag. Have decided to try voiding trial tomorrow, will continue on Flomax. Encouraged to ambulate.  Hewitt Shorts, MD 09/07/2011, 11:44 AM

## 2011-09-08 MED ORDER — TAMSULOSIN HCL 0.4 MG PO CAPS: 0.4000 mg | ORAL_CAPSULE | Freq: Every day | ORAL | Status: DC

## 2011-09-08 NOTE — Progress Notes (Signed)
Subjective: Patient reports He states is doing well no leg pain no back pain it is to the catheter osseous chance to void in  Objective: Vital signs in last 24 hours: Temp:  [97.4 F (36.3 C)-99 F (37.2 C)] 98.7 F (37.1 C) (08/16 0810) Pulse Rate:  [81-108] 86  (08/16 0810) Resp:  [14-18] 18  (08/16 0810) BP: (154-167)/(68-87) 167/76 mmHg (08/16 0810) SpO2:  [95 %-97 %] 96 % (08/16 0810)  Intake/Output from previous day: 08/15 0701 - 08/16 0700 In: 960 [P.O.:960] Out: 1500 [Urine:1500] Intake/Output this shift:    Strength out of 5 wound clean and dry  Lab Results:  [facility] 09/06/11 0955  WBC 8.5  HGB 7.4*  HCT 24.1*  PLT 286   BMET No results found for this basename: NA:2,K:2,CL:2,CO2:2,GLUCOSE:2,BUN:2,CREATININE:2,CALCIUM:2 in the last 72 hours  Studies/Results: Dg Lumbar Spine 2-3 Views  09/06/2011  *RADIOLOGY REPORT*  Clinical Data: L3-4 laminectomy and discectomy.  OPERATIVE LUMBAR SPINE - 2-3 VIEW  Comparison: Lumbar spine MRI 08/22/2011 [facility].  Findings: The same numbering scheme will be utilized as on the MRI, with L5 representing the last lumbar segment.  Images were submitted for interpretation post-operatively.  Initial image obtained at 1200 hours demonstrates the localizer needle adjacent to the L3 spinous process.  Second image obtained at 1215 hours demonstrates the localizer device posterior to the L4 vertebral body, directed toward L3-4.  IMPRESSION: L3-4 localized intraoperatively.  Original Report Authenticated By: Arnell Sieving, M.D.    Assessment/Plan: Observation as morning if he starts to void spontaneously he'll be a be discharged home if not consideration to cardiac catheter in place and discharge and with the catheter. He apparently has pain medication at home.  LOS: 2 days     Braulio Kiedrowski P 09/08/2011, 8:26 AM

## 2011-09-08 NOTE — Progress Notes (Signed)
Pt doing well. Pt able to void 325cc prior to D/C. Dr. Wynetta Emery called with the results. Pt and wife given D/C instructions with rx. Pt D/C'd home via wheelchair per MD order. Rema Fendt, RN

## 2011-09-08 NOTE — Discharge Summary (Signed)
  Physician Discharge Summary  Patient ID: Antonio [name] MRN: [id number] DOB/AGE: 1941-01-01 71 y.o.  Admit date: 09/06/2011 Discharge date: 09/08/2011  Admission Diagnoses: Lumbar spondylosis with radiculopathy  Discharge Diagnoses: Same Active Problems:  * No active hospital problems. *    Discharged Condition: good  Hospital Course: This was admitted hospital underwent lumbar lengthening discectomy doubly patient did very well significant room in his back and leg pain however had some difficulty voiding head and a catheter replaced and the fascia Flomax cath was taken out patient Lahey be discharged home if he avoids potassium or possibly with the catheter in place at her followup with Dr. Ezzard Standing proximal one 2 weeks  Consults: Significant Diagnostic Studies: Treatments: Lumbar leg discectomy Discharge Exam: Blood pressure 167/76, pulse 86, temperature 98.7 F (37.1 C), temperature source Oral, resp. rate 18, height 5\' 6"  (1.676 m), weight 72.576 kg (160 lb), SpO2 96.00%. Strength out of 5 wound clean and  Disposition: Home   Medication List  As of 09/08/2011  8:28 AM   TAKE these medications         buPROPion 150 MG 24 hr tablet   Commonly known as: WELLBUTRIN XL   Take 450 mg by mouth daily.      multivitamin with minerals Tabs   Take 1 tablet by mouth daily.      omeprazole 20 MG capsule   Commonly known as: PRILOSEC   Take 20 mg by mouth daily.      Tamsulosin HCl 0.4 MG Caps   Commonly known as: FLOMAX   Take 1 capsule (0.4 mg total) by mouth daily after breakfast.             Signed: [name] 09/08/2011, 8:28 AM

## 2011-10-16 ENCOUNTER — Other Ambulatory Visit: Payer: Self-pay | Admitting: Gastroenterology

## 2011-10-16 DIAGNOSIS — D509 Iron deficiency anemia, unspecified: Secondary | ICD-10-CM

## 2011-10-18 ENCOUNTER — Other Ambulatory Visit: Payer: Self-pay | Admitting: Internal Medicine

## 2011-10-19 ENCOUNTER — Ambulatory Visit
Admission: RE | Admit: 2011-10-19 | Discharge: 2011-10-19 | Disposition: A | Discharge status: Home / Self Care | Payer: Medicare Other | Source: Ambulatory Visit | Attending: Gastroenterology | Admitting: Gastroenterology

## 2011-10-19 DIAGNOSIS — D509 Iron deficiency anemia, unspecified: Secondary | ICD-10-CM

## 2011-10-19 MED ORDER — IOHEXOL 300 MG/ML  SOLN
80.0000 mL | Freq: Once | INTRAMUSCULAR | Status: AC | PRN
  Administered 2011-10-19: 80 mL via INTRAVENOUS

## 2011-10-23 ENCOUNTER — Ambulatory Visit (HOSPITAL_COMMUNITY)
Admission: RE | Admit: 2011-10-23 | Discharge: 2011-10-23 | Disposition: A | Discharge status: Home / Self Care | Payer: Medicare Other | Source: Ambulatory Visit | Attending: Internal Medicine | Admitting: Internal Medicine

## 2011-10-23 DIAGNOSIS — D509 Iron deficiency anemia, unspecified: Secondary | ICD-10-CM | POA: Insufficient documentation

## 2011-10-23 LAB — ABO/RH: ABO/RH(D): A POS

## 2011-10-23 LAB — PREPARE RBC (CROSSMATCH)

## 2011-10-24 ENCOUNTER — Encounter (HOSPITAL_COMMUNITY)
Admission: RE | Admit: 2011-10-24 | Discharge: 2011-10-24 | Disposition: A | Discharge status: Home / Self Care | Payer: Medicare Other | Source: Ambulatory Visit | Attending: Internal Medicine | Admitting: Internal Medicine

## 2011-10-24 DIAGNOSIS — D509 Iron deficiency anemia, unspecified: Secondary | ICD-10-CM | POA: Insufficient documentation

## 2011-10-25 LAB — TYPE AND SCREEN
ABO/RH(D): A POS
Antibody Screen: NEGATIVE
Unit division: 0
Unit division: 0

## 2012-10-20 IMAGING — CT CT ENTEROGRAPHY (ABD-PELV W/ CM)
2 of 6 series · 11 of 36 positions shown, 18 images · IV contrast (VOLUMEN & 80CC OMNI 300)
Comparison: Virtual colonoscopy of 07/08/2007

CLINICAL DATA: Iron deficiency anemia.  Evaluate for source of GI
bleeding.  Constipation.  Intermittent gross hematuria for 4 days.

CT ABDOMEN AND PELVIS WITH CONTRAST (CT ENTEROGRAPHY)
TECHNIQUE: Multidetector CT of the abdomen and pelvis during bolus
administration of intravenous contrast. Negative oral contrast
VoLumen was given.
Contrast: 80mL OMNIPAQUE IOHEXOL 300 MG/ML  SOLN

[Series 3: enterography (id) · axial · 0.78mm/px · z∈[-406,+29]mm · 10 of 210 slices shown, 16 images]
[im 18/210  soft-tissue]
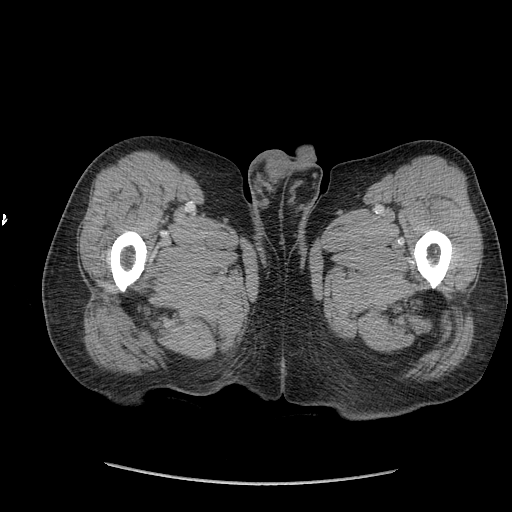
[im 18/210  bone]
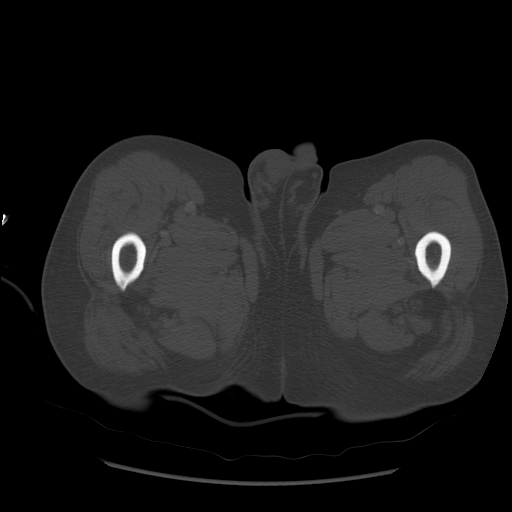
[im 35/210  soft-tissue]
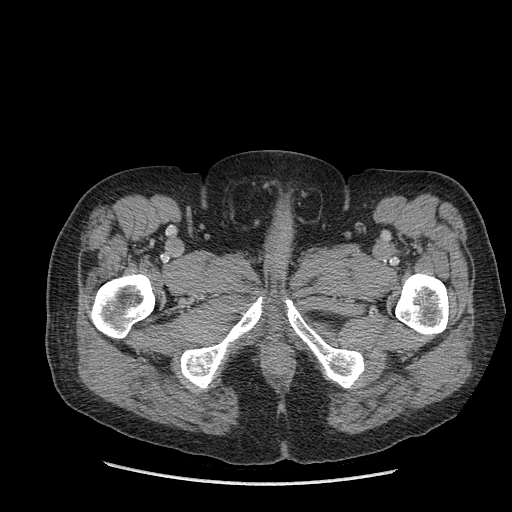
[im 53/210  soft-tissue]
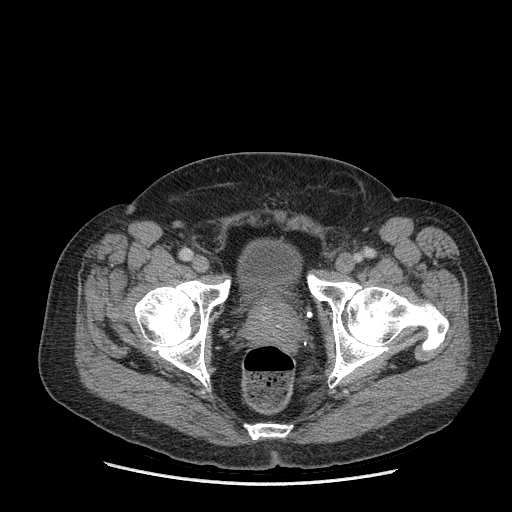
[im 70/210  soft-tissue]
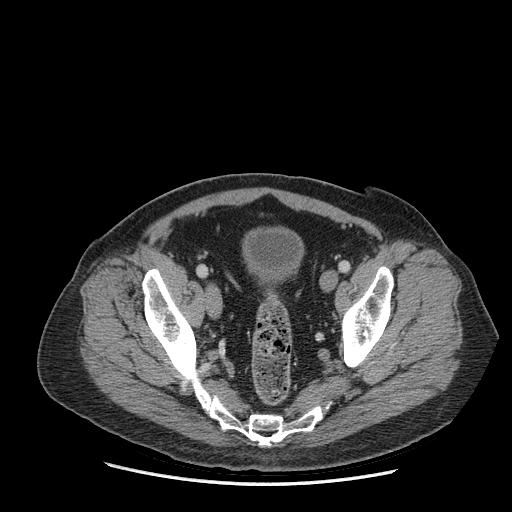
[im 88/210  soft-tissue]
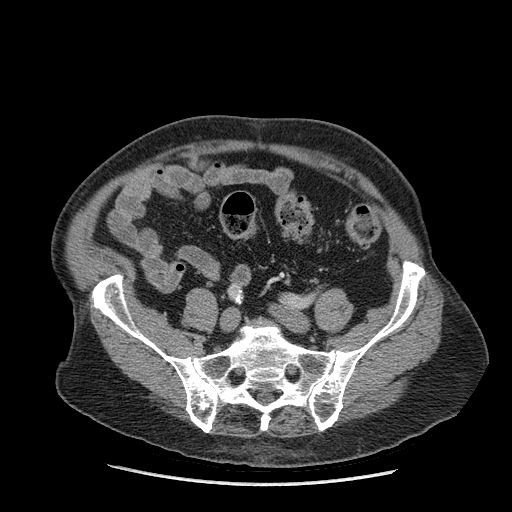
[im 122/210  soft-tissue]
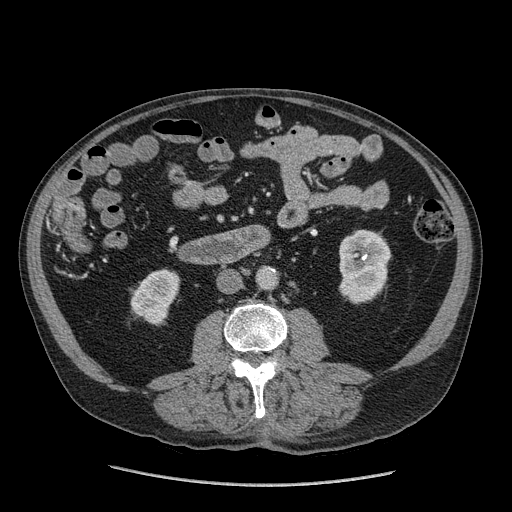
[im 140/210  soft-tissue]
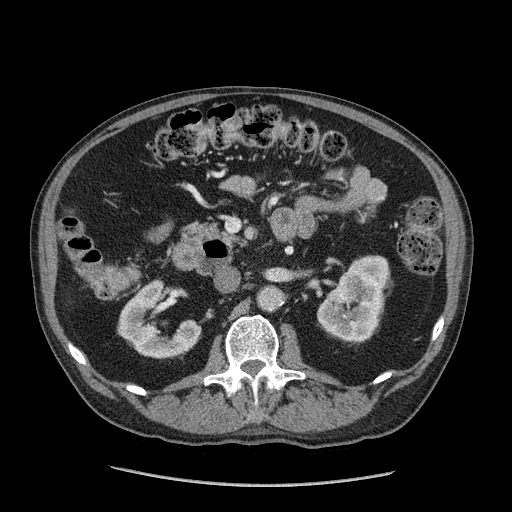
[im 140/210  lung]
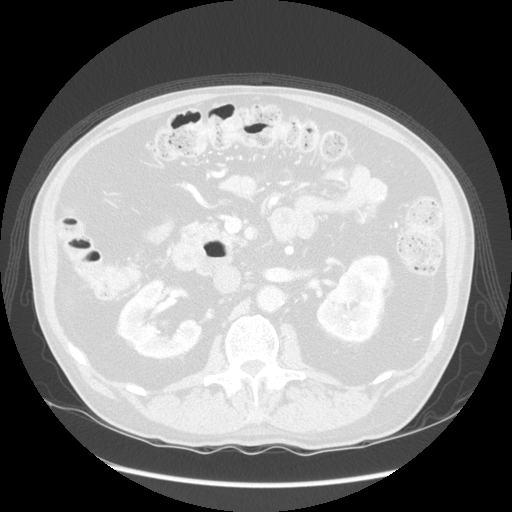
[im 157/210  soft-tissue]
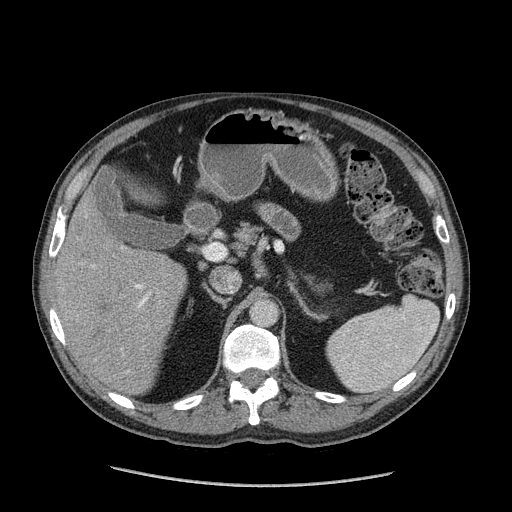
[im 157/210  lung]
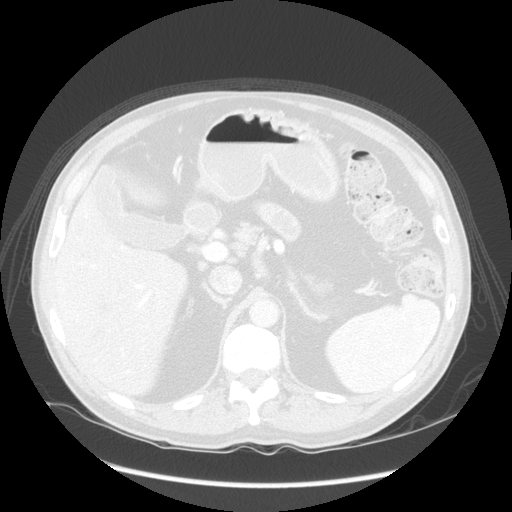
[im 175/210  soft-tissue]
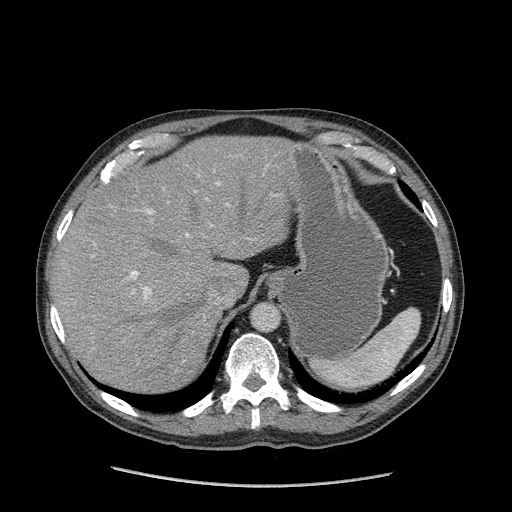
[im 175/210  lung]
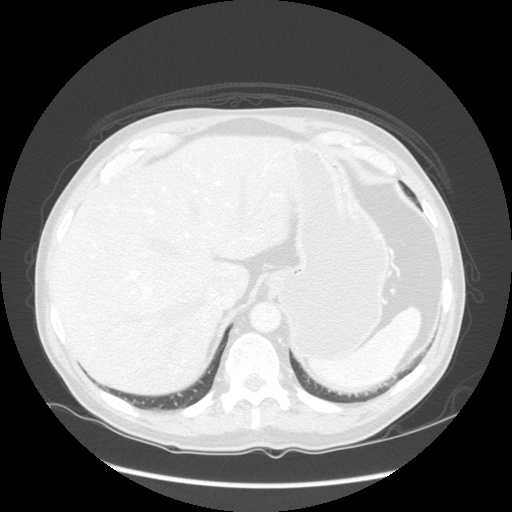
[im 175/210  bone]
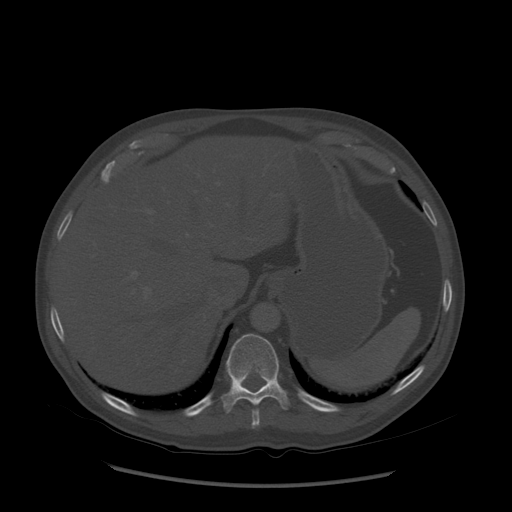
[im 192/210  soft-tissue]
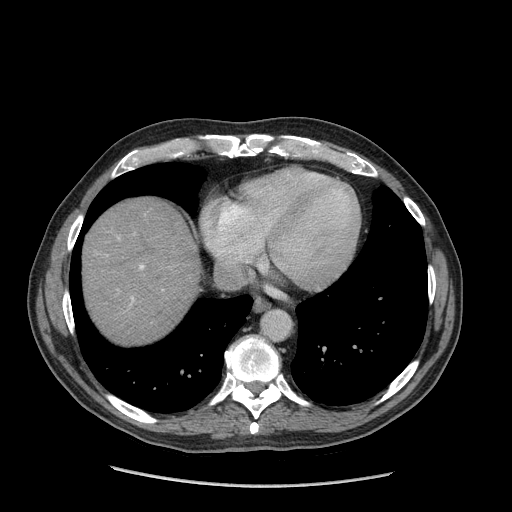
[im 192/210  lung]
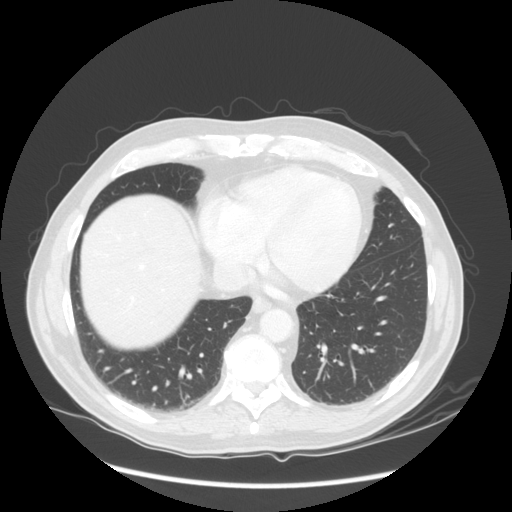

[Series 601: coronal body · coronal · 1.02mm/px · 1 of 123 slices shown, 2 images]
[im 41/123  soft-tissue]
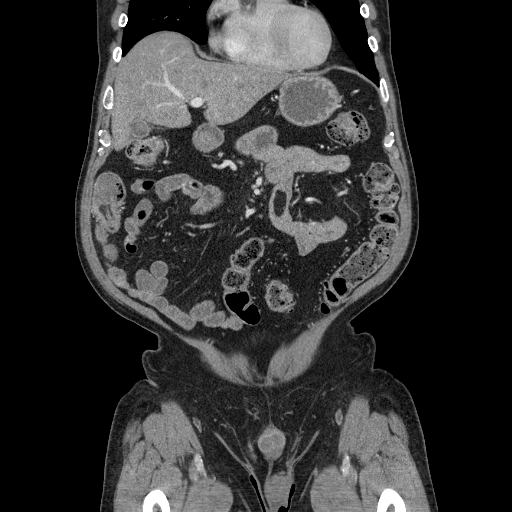
[im 41/123  bone]
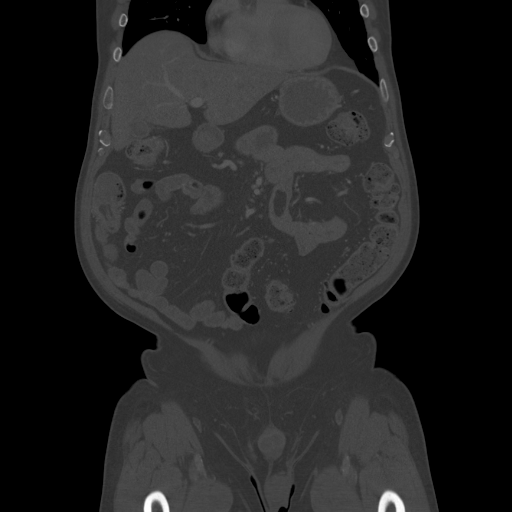

[11 of 36 positions shown; findings below may reference images not displayed]

FINDINGS: Clear lung bases.  Mild cardiomegaly, without pericardial
or pleural effusion

Normal liver, spleen, gallbladder.  The gastric antrum is
underdistended.  Stomach otherwise unremarkable.

There is a moderate sized duodenal diverticulum off the inferior
genu.  Normal pancreas, biliary tract, adrenal glands.

Bilateral renal collecting system calculi, without hydroureter or
hydronephrosis.

Small retroperitoneal nodes, without retroperitoneal or retrocrural
adenopathy.

Colonic diverticulosis. Normal terminal ileum and appendix.

The extent of small bowel distention with neutral contrast is
moderate.  A 1.2 by 1.9 x 3.0 cm cm small bowel lipoma is
identified within the jejunum on image 82 transverse and image 42
coronal..  No obstruction.  Otherwise, no evidence of small bowel
mass.  No areas of hyperenhancement, wall thickening, or mesenteric
abnormality. No ascites.

Large right and moderate left-sided fat containing inguinal
hernias.  The left internal iliac artery is ectatic at 1.1 cm on
image 133 of series 3. No pelvic adenopathy.  Normal urinary
bladder with moderate prostatomegaly. No significant free fluid.
Prior lumbar laminectomies. No acute osseous abnormality.  Disc
bulges at L4-L5 and L5-S1.
IMPRESSION: 1.  3.0 cm jejunal lipoma, without obstruction.
2.  No other explanation for GI bleeding.  The extent of small
bowel distention with enteric contrast is only moderate.
3.  Bilateral renal calculi.
4.  Left internal iliac artery ectasia.

## 2012-11-28 ENCOUNTER — Other Ambulatory Visit: Payer: Self-pay | Admitting: Neurosurgery

## 2012-11-28 DIAGNOSIS — M5416 Radiculopathy, lumbar region: Secondary | ICD-10-CM

## 2012-12-06 ENCOUNTER — Ambulatory Visit
Admission: RE | Admit: 2012-12-06 | Discharge: 2012-12-06 | Disposition: A | Discharge status: Home / Self Care | Payer: Medicare Other | Source: Ambulatory Visit | Attending: Neurosurgery | Admitting: Neurosurgery

## 2012-12-06 DIAGNOSIS — M5416 Radiculopathy, lumbar region: Secondary | ICD-10-CM

## 2012-12-06 MED ORDER — GADOBENATE DIMEGLUMINE 529 MG/ML IV SOLN
15.0000 mL | Freq: Once | INTRAVENOUS | Status: AC | PRN
  Administered 2012-12-06: 15 mL via INTRAVENOUS

## 2012-12-16 ENCOUNTER — Other Ambulatory Visit: Payer: Self-pay | Admitting: Neurosurgery

## 2012-12-16 ENCOUNTER — Encounter (HOSPITAL_COMMUNITY): Payer: Self-pay | Admitting: Pharmacy Technician

## 2012-12-24 ENCOUNTER — Encounter (HOSPITAL_COMMUNITY): Payer: Self-pay

## 2012-12-24 ENCOUNTER — Encounter (HOSPITAL_COMMUNITY)
Admission: RE | Admit: 2012-12-24 | Discharge: 2012-12-24 | Disposition: A | Discharge status: Home / Self Care | Payer: Medicare Other | Source: Ambulatory Visit | Attending: Neurosurgery | Admitting: Neurosurgery

## 2012-12-24 HISTORY — DX: Anemia, unspecified: D64.9

## 2012-12-24 LAB — CBC
HCT: 39.7 % (ref 39.0–52.0)
Hemoglobin: 13.3 g/dL (ref 13.0–17.0)
MCH: 31.1 pg (ref 26.0–34.0)
MCHC: 33.5 g/dL (ref 30.0–36.0)
MCV: 92.8 fL (ref 78.0–100.0)
Platelets: 233 10*3/uL (ref 150–400)
RBC: 4.28 MIL/uL (ref 4.22–5.81)
RDW: 12.8 % (ref 11.5–15.5)
WBC: 8.4 10*3/uL (ref 4.0–10.5)

## 2012-12-24 LAB — BASIC METABOLIC PANEL
BUN: 29 mg/dL — ABNORMAL HIGH (ref 6–23)
CO2: 28 mEq/L (ref 19–32)
Calcium: 8.8 mg/dL (ref 8.4–10.5)
Chloride: 102 mEq/L (ref 96–112)
Creatinine, Ser: 1.45 mg/dL — ABNORMAL HIGH (ref 0.50–1.35)
GFR calc Af Amer: 54 mL/min — ABNORMAL LOW (ref >90)
GFR calc non Af Amer: 46 mL/min — ABNORMAL LOW (ref >90)
Glucose, Bld: 150 mg/dL — ABNORMAL HIGH (ref 70–99)
Potassium: 3.4 mEq/L — ABNORMAL LOW (ref 3.5–5.1)
Sodium: 139 mEq/L (ref 135–145)

## 2012-12-24 LAB — SURGICAL PCR SCREEN
MRSA, PCR: NEGATIVE
Staphylococcus aureus: POSITIVE — AB

## 2012-12-24 NOTE — Pre-Procedure Instructions (Signed)
KOI ZANGARA  12/24/2012   Your procedure is scheduled on:  12/26/12  Report to [facility]  2 * 3 at 1030 AM.  Call this number if you have problems the morning of surgery: [phone]   Remember:   Do not eat food or drink liquids after midnight.   Take these medicines the morning of surgery with A SIP OF WATER: wellbutrin,flomax   Do not wear jewelry, make-up or nail polish.  Do not wear lotions, powders, or perfumes. You may wear deodorant.  Do not shave 48 hours prior to surgery. Men may shave face and neck.  Do not bring valuables to the hospital.  [facility] is not responsible                  for any belongings or valuables.               Contacts, dentures or bridgework may not be worn into surgery.  Leave suitcase in the car. After surgery it may be brought to your room.  For patients admitted to the hospital, discharge time is determined by your                treatment team.               Patients discharged the day of surgery will not be allowed to drive  home.  Name and phone number of your driver: family  Special Instructions: Shower using CHG 2 nights before surgery and the night before surgery.  If you shower the day of surgery use CHG.  Use special wash - you have one bottle of CHG for all showers.  You should use approximately 1/3 of the bottle for each shower.   Please read over the following fact sheets that you were given: Pain Booklet, Coughing and Deep Breathing and Surgical Site Infection Prevention

## 2012-12-25 MED ORDER — CEFAZOLIN SODIUM-DEXTROSE 2-3 GM-% IV SOLR
2.0000 g | INTRAVENOUS | Status: AC
  Administered 2012-12-26: 2 g via INTRAVENOUS
  Filled 2012-12-25: qty 50

## 2012-12-26 ENCOUNTER — Observation Stay (HOSPITAL_COMMUNITY)
Admission: RE | Admit: 2012-12-26 | Discharge: 2012-12-27 | Disposition: A | Discharge status: Home / Self Care | Payer: Medicare Other | Source: Ambulatory Visit | Attending: Neurosurgery | Admitting: Neurosurgery

## 2012-12-26 ENCOUNTER — Encounter (HOSPITAL_COMMUNITY): Payer: Medicare Other | Admitting: Certified Registered Nurse Anesthetist

## 2012-12-26 ENCOUNTER — Encounter (HOSPITAL_COMMUNITY)
Admission: RE | Disposition: A | Discharge status: Home / Self Care | Payer: Self-pay | Source: Ambulatory Visit | Attending: Neurosurgery

## 2012-12-26 ENCOUNTER — Inpatient Hospital Stay (HOSPITAL_COMMUNITY): Payer: Medicare Other

## 2012-12-26 ENCOUNTER — Encounter (HOSPITAL_COMMUNITY): Payer: Self-pay | Admitting: *Deleted

## 2012-12-26 ENCOUNTER — Inpatient Hospital Stay (HOSPITAL_COMMUNITY): Payer: Medicare Other | Admitting: Certified Registered Nurse Anesthetist

## 2012-12-26 DIAGNOSIS — Z79899 Other long term (current) drug therapy: Secondary | ICD-10-CM | POA: Insufficient documentation

## 2012-12-26 DIAGNOSIS — M47817 Spondylosis without myelopathy or radiculopathy, lumbosacral region: Secondary | ICD-10-CM | POA: Insufficient documentation

## 2012-12-26 DIAGNOSIS — M51379 Other intervertebral disc degeneration, lumbosacral region without mention of lumbar back pain or lower extremity pain: Secondary | ICD-10-CM | POA: Insufficient documentation

## 2012-12-26 DIAGNOSIS — Z01812 Encounter for preprocedural laboratory examination: Secondary | ICD-10-CM | POA: Insufficient documentation

## 2012-12-26 DIAGNOSIS — M5137 Other intervertebral disc degeneration, lumbosacral region: Secondary | ICD-10-CM | POA: Insufficient documentation

## 2012-12-26 DIAGNOSIS — M5126 Other intervertebral disc displacement, lumbar region: Principal | ICD-10-CM | POA: Diagnosis present

## 2012-12-26 HISTORY — PX: LUMBAR LAMINECTOMY/DECOMPRESSION MICRODISCECTOMY: SHX5026

## 2012-12-26 SURGERY — LUMBAR LAMINECTOMY/DECOMPRESSION MICRODISCECTOMY 1 LEVEL
Anesthesia: General | Laterality: Left

## 2012-12-26 MED ORDER — ONDANSETRON HCL 4 MG/2ML IJ SOLN: 4.0000 mg | Freq: Once | INTRAMUSCULAR | Status: DC | PRN

## 2012-12-26 MED ORDER — LIDOCAINE-EPINEPHRINE 1 %-1:100000 IJ SOLN
INTRAMUSCULAR | Status: DC | PRN
  Administered 2012-12-26: 27 mL

## 2012-12-26 MED ORDER — ONDANSETRON HCL 4 MG/2ML IJ SOLN
INTRAMUSCULAR | Status: DC | PRN
  Administered 2012-12-26: 4 mg via INTRAVENOUS

## 2012-12-26 MED ORDER — 0.9 % SODIUM CHLORIDE (POUR BTL) OPTIME
TOPICAL | Status: DC | PRN
  Administered 2012-12-26: 1000 mL

## 2012-12-26 MED ORDER — ZOLPIDEM TARTRATE 5 MG PO TABS: 5.0000 mg | ORAL_TABLET | Freq: Every evening | ORAL | Status: DC | PRN

## 2012-12-26 MED ORDER — LACTATED RINGERS IV SOLN
INTRAVENOUS | Status: DC | PRN
  Administered 2012-12-26 (×2): via INTRAVENOUS

## 2012-12-26 MED ORDER — KETOROLAC TROMETHAMINE 30 MG/ML IJ SOLN: 30.0000 mg | Freq: Once | INTRAMUSCULAR | Status: DC

## 2012-12-26 MED ORDER — MIDAZOLAM HCL 5 MG/5ML IJ SOLN
INTRAMUSCULAR | Status: DC | PRN
  Administered 2012-12-26: 2 mg via INTRAVENOUS

## 2012-12-26 MED ORDER — GLYCOPYRROLATE 0.2 MG/ML IJ SOLN
INTRAMUSCULAR | Status: DC | PRN
  Administered 2012-12-26: .6 mg via INTRAVENOUS

## 2012-12-26 MED ORDER — HEMOSTATIC AGENTS (NO CHARGE) OPTIME
TOPICAL | Status: DC | PRN
  Administered 2012-12-26: 1 via TOPICAL

## 2012-12-26 MED ORDER — KETOROLAC TROMETHAMINE 30 MG/ML IJ SOLN
30.0000 mg | Freq: Four times a day (QID) | INTRAMUSCULAR | Status: DC
  Administered 2012-12-26 – 2012-12-27 (×2): 30 mg via INTRAVENOUS
  Filled 2012-12-26 (×6): qty 1

## 2012-12-26 MED ORDER — ACETAMINOPHEN 325 MG PO TABS: 650.0000 mg | ORAL_TABLET | ORAL | Status: DC | PRN

## 2012-12-26 MED ORDER — HYDROCODONE-ACETAMINOPHEN 5-325 MG PO TABS: 1.0000 | ORAL_TABLET | ORAL | Status: DC | PRN

## 2012-12-26 MED ORDER — ALUM & MAG HYDROXIDE-SIMETH 200-200-20 MG/5ML PO SUSP: 30.0000 mL | Freq: Four times a day (QID) | ORAL | Status: DC | PRN

## 2012-12-26 MED ORDER — CYCLOBENZAPRINE HCL 10 MG PO TABS: 10.0000 mg | ORAL_TABLET | Freq: Three times a day (TID) | ORAL | Status: DC | PRN

## 2012-12-26 MED ORDER — MENTHOL 3 MG MT LOZG: 1.0000 | LOZENGE | OROMUCOSAL | Status: DC | PRN

## 2012-12-26 MED ORDER — SODIUM CHLORIDE 0.9 % IR SOLN
Status: DC | PRN
  Administered 2012-12-26: 15:00:00

## 2012-12-26 MED ORDER — MORPHINE SULFATE 4 MG/ML IJ SOLN: 4.0000 mg | INTRAMUSCULAR | Status: DC | PRN

## 2012-12-26 MED ORDER — MUPIROCIN 2 % EX OINT
TOPICAL_OINTMENT | Freq: Two times a day (BID) | CUTANEOUS | Status: DC
  Administered 2012-12-26: 23:00:00 via NASAL

## 2012-12-26 MED ORDER — BISACODYL 10 MG RE SUPP: 10.0000 mg | Freq: Every day | RECTAL | Status: DC | PRN

## 2012-12-26 MED ORDER — ARTIFICIAL TEARS OP OINT
TOPICAL_OINTMENT | OPHTHALMIC | Status: DC | PRN
  Administered 2012-12-26: 1 via OPHTHALMIC

## 2012-12-26 MED ORDER — TAMSULOSIN HCL 0.4 MG PO CAPS
0.4000 mg | ORAL_CAPSULE | Freq: Once | ORAL | Status: AC
  Administered 2012-12-26: 0.4 mg via ORAL
  Filled 2012-12-26: qty 1

## 2012-12-26 MED ORDER — FUROSEMIDE 40 MG PO TABS
40.0000 mg | ORAL_TABLET | Freq: Every day | ORAL | Status: DC
  Filled 2012-12-26 (×2): qty 1

## 2012-12-26 MED ORDER — FENTANYL CITRATE 0.05 MG/ML IJ SOLN
INTRAMUSCULAR | Status: DC | PRN
  Administered 2012-12-26: 100 ug via INTRAVENOUS

## 2012-12-26 MED ORDER — FENTANYL CITRATE 0.05 MG/ML IJ SOLN
INTRAMUSCULAR | Status: AC
  Filled 2012-12-26: qty 2

## 2012-12-26 MED ORDER — BUPROPION HCL ER (XL) 300 MG PO TB24
450.0000 mg | ORAL_TABLET | Freq: Every day | ORAL | Status: DC
  Filled 2012-12-26 (×2): qty 1

## 2012-12-26 MED ORDER — POTASSIUM CHLORIDE CRYS ER 20 MEQ PO TBCR
20.0000 meq | EXTENDED_RELEASE_TABLET | Freq: Every day | ORAL | Status: DC
  Filled 2012-12-26 (×2): qty 1

## 2012-12-26 MED ORDER — EPHEDRINE SULFATE 50 MG/ML IJ SOLN
INTRAMUSCULAR | Status: DC | PRN
  Administered 2012-12-26 (×3): 5 mg via INTRAVENOUS

## 2012-12-26 MED ORDER — THROMBIN 5000 UNITS EX SOLR
CUTANEOUS | Status: DC | PRN
  Administered 2012-12-26 (×2): 5000 [IU] via TOPICAL

## 2012-12-26 MED ORDER — ACETAMINOPHEN 650 MG RE SUPP: 650.0000 mg | RECTAL | Status: DC | PRN

## 2012-12-26 MED ORDER — LIDOCAINE HCL (CARDIAC) 20 MG/ML IV SOLN
INTRAVENOUS | Status: DC | PRN
  Administered 2012-12-26: 60 mg via INTRAVENOUS

## 2012-12-26 MED ORDER — TAMSULOSIN HCL 0.4 MG PO CAPS
0.4000 mg | ORAL_CAPSULE | Freq: Every day | ORAL | Status: DC
  Filled 2012-12-26 (×2): qty 1

## 2012-12-26 MED ORDER — MAGNESIUM HYDROXIDE 400 MG/5ML PO SUSP: 30.0000 mL | Freq: Every day | ORAL | Status: DC | PRN

## 2012-12-26 MED ORDER — KCL IN DEXTROSE-NACL 40-5-0.45 MEQ/L-%-% IV SOLN
INTRAVENOUS | Status: DC
  Filled 2012-12-26 (×3): qty 1000

## 2012-12-26 MED ORDER — FENTANYL CITRATE 0.05 MG/ML IJ SOLN
INTRAMUSCULAR | Status: DC | PRN
  Administered 2012-12-26: 50 ug via INTRAVENOUS
  Administered 2012-12-26: 100 ug via INTRAVENOUS

## 2012-12-26 MED ORDER — HYDROMORPHONE HCL PF 1 MG/ML IJ SOLN: 0.2500 mg | INTRAMUSCULAR | Status: DC | PRN

## 2012-12-26 MED ORDER — PROPOFOL 10 MG/ML IV BOLUS
INTRAVENOUS | Status: DC | PRN
  Administered 2012-12-26: 150 mg via INTRAVENOUS

## 2012-12-26 MED ORDER — LACTATED RINGERS IV SOLN
INTRAVENOUS | Status: DC
  Administered 2012-12-26: 11:00:00 via INTRAVENOUS

## 2012-12-26 MED ORDER — HYDROXYZINE HCL 25 MG PO TABS: 50.0000 mg | ORAL_TABLET | ORAL | Status: DC | PRN

## 2012-12-26 MED ORDER — PHENOL 1.4 % MT LIQD: 1.0000 | OROMUCOSAL | Status: DC | PRN

## 2012-12-26 MED ORDER — SODIUM CHLORIDE 0.9 % IJ SOLN
3.0000 mL | Freq: Two times a day (BID) | INTRAMUSCULAR | Status: DC
  Administered 2012-12-26: 3 mL via INTRAVENOUS

## 2012-12-26 MED ORDER — SODIUM CHLORIDE 0.9 % IJ SOLN: 3.0000 mL | INTRAMUSCULAR | Status: DC | PRN

## 2012-12-26 MED ORDER — ONDANSETRON HCL 4 MG/2ML IJ SOLN: 4.0000 mg | Freq: Four times a day (QID) | INTRAMUSCULAR | Status: DC | PRN

## 2012-12-26 MED ORDER — OXYCODONE-ACETAMINOPHEN 5-325 MG PO TABS: 1.0000 | ORAL_TABLET | ORAL | Status: DC | PRN

## 2012-12-26 MED ORDER — BUPIVACAINE HCL (PF) 0.5 % IJ SOLN
INTRAMUSCULAR | Status: DC | PRN
  Administered 2012-12-26: 27 mL

## 2012-12-26 MED ORDER — METHYLPREDNISOLONE ACETATE 80 MG/ML IJ SUSP
INTRAMUSCULAR | Status: DC | PRN
  Administered 2012-12-26: 80 mg

## 2012-12-26 MED ORDER — ROCURONIUM BROMIDE 100 MG/10ML IV SOLN
INTRAVENOUS | Status: DC | PRN
  Administered 2012-12-26: 50 mg via INTRAVENOUS

## 2012-12-26 MED ORDER — NEOSTIGMINE METHYLSULFATE 1 MG/ML IJ SOLN
INTRAMUSCULAR | Status: DC | PRN
  Administered 2012-12-26: 3.5 mg via INTRAVENOUS

## 2012-12-26 SURGICAL SUPPLY — 60 items
ADH SKN CLS APL DERMABOND .7 (GAUZE/BANDAGES/DRESSINGS)
ADH SKN CLS LQ APL DERMABOND (GAUZE/BANDAGES/DRESSINGS) ×1
APL SKNCLS STERI-STRIP NONHPOA (GAUZE/BANDAGES/DRESSINGS)
BAG DECANTER FOR FLEXI CONT (MISCELLANEOUS) ×2 IMPLANT
BENZOIN TINCTURE PRP APPL 2/3 (GAUZE/BANDAGES/DRESSINGS) IMPLANT
BLADE SURG ROTATE 9660 (MISCELLANEOUS) IMPLANT
BRUSH SCRUB EZ PLAIN DRY (MISCELLANEOUS) ×2 IMPLANT
BUR ACORN 6.0 ACORN (BURR) IMPLANT
BUR ACRON 5.0MM COATED (BURR) IMPLANT
BUR MATCHSTICK NEURO 3.0 LAGG (BURR) ×2 IMPLANT
CANISTER SUCT 3000ML (MISCELLANEOUS) ×2 IMPLANT
CONT SPEC 4OZ CLIKSEAL STRL BL (MISCELLANEOUS) IMPLANT
DERMABOND ADHESIVE PROPEN (GAUZE/BANDAGES/DRESSINGS) ×1
DERMABOND ADVANCED (GAUZE/BANDAGES/DRESSINGS)
DERMABOND ADVANCED .7 DNX12 (GAUZE/BANDAGES/DRESSINGS) IMPLANT
DERMABOND ADVANCED .7 DNX6 (GAUZE/BANDAGES/DRESSINGS) IMPLANT
DRAPE LAPAROTOMY 100X72X124 (DRAPES) ×2 IMPLANT
DRAPE MICROSCOPE LEICA (MISCELLANEOUS) ×2 IMPLANT
DRAPE POUCH INSTRU U-SHP 10X18 (DRAPES) ×2 IMPLANT
DRSG EMULSION OIL 3X3 NADH (GAUZE/BANDAGES/DRESSINGS) IMPLANT
ELECT REM PT RETURN 9FT ADLT (ELECTROSURGICAL) ×2
ELECTRODE REM PT RTRN 9FT ADLT (ELECTROSURGICAL) ×1 IMPLANT
GAUZE SPONGE 4X4 16PLY XRAY LF (GAUZE/BANDAGES/DRESSINGS) IMPLANT
GLOVE BIOGEL PI IND STRL 8 (GLOVE) ×1 IMPLANT
GLOVE BIOGEL PI INDICATOR 8 (GLOVE) ×1
GLOVE ECLIPSE 7.5 STRL STRAW (GLOVE) ×3 IMPLANT
GLOVE EXAM NITRILE LRG STRL (GLOVE) IMPLANT
GLOVE EXAM NITRILE MD LF STRL (GLOVE) IMPLANT
GLOVE EXAM NITRILE XL STR (GLOVE) IMPLANT
GLOVE EXAM NITRILE XS STR PU (GLOVE) IMPLANT
GOWN BRE IMP SLV AUR LG STRL (GOWN DISPOSABLE) ×2 IMPLANT
GOWN BRE IMP SLV AUR XL STRL (GOWN DISPOSABLE) IMPLANT
GOWN STRL REIN 2XL LVL4 (GOWN DISPOSABLE) IMPLANT
KIT BASIN OR (CUSTOM PROCEDURE TRAY) ×2 IMPLANT
KIT ROOM TURNOVER OR (KITS) ×2 IMPLANT
NDL HYPO 18GX1.5 BLUNT FILL (NEEDLE) IMPLANT
NDL SPNL 18GX3.5 QUINCKE PK (NEEDLE) ×1 IMPLANT
NDL SPNL 22GX3.5 QUINCKE BK (NEEDLE) ×1 IMPLANT
NEEDLE HYPO 18GX1.5 BLUNT FILL (NEEDLE) ×2 IMPLANT
NEEDLE SPNL 18GX3.5 QUINCKE PK (NEEDLE) ×2 IMPLANT
NEEDLE SPNL 22GX3.5 QUINCKE BK (NEEDLE) ×2 IMPLANT
NS IRRIG 1000ML POUR BTL (IV SOLUTION) ×2 IMPLANT
PACK LAMINECTOMY NEURO (CUSTOM PROCEDURE TRAY) ×2 IMPLANT
PAD ARMBOARD 7.5X6 YLW CONV (MISCELLANEOUS) ×6 IMPLANT
PATTIES SURGICAL .5 X1 (DISPOSABLE) ×2 IMPLANT
RUBBERBAND STERILE (MISCELLANEOUS) ×4 IMPLANT
SPONGE GAUZE 4X4 12PLY (GAUZE/BANDAGES/DRESSINGS) IMPLANT
SPONGE LAP 4X18 X RAY DECT (DISPOSABLE) IMPLANT
SPONGE SURGIFOAM ABS GEL SZ50 (HEMOSTASIS) ×2 IMPLANT
STRIP CLOSURE SKIN 1/2X4 (GAUZE/BANDAGES/DRESSINGS) IMPLANT
SUT PROLENE 6 0 BV (SUTURE) IMPLANT
SUT VIC AB 1 CT1 18XBRD ANBCTR (SUTURE) ×1 IMPLANT
SUT VIC AB 1 CT1 8-18 (SUTURE) ×4
SUT VIC AB 2-0 CP2 18 (SUTURE) ×3 IMPLANT
SUT VIC AB 3-0 SH 8-18 (SUTURE) IMPLANT
SYR 20ML ECCENTRIC (SYRINGE) ×2 IMPLANT
SYR 5ML LL (SYRINGE) ×1 IMPLANT
TOWEL OR 17X24 6PK STRL BLUE (TOWEL DISPOSABLE) ×2 IMPLANT
TOWEL OR 17X26 10 PK STRL BLUE (TOWEL DISPOSABLE) ×2 IMPLANT
WATER STERILE IRR 1000ML POUR (IV SOLUTION) ×2 IMPLANT

## 2012-12-26 NOTE — Progress Notes (Signed)
Filed Vitals:   12/26/12 1630 12/26/12 1645 12/26/12 1700 12/26/12 1715  BP: 175/80 152/76 159/79   Pulse: 85 80 75   Temp:      TempSrc:      Resp: 23 11 11    SpO2: 100% 100%  100%    CBC  Recent Labs  12/24/12 1030  WBC 8.4  HGB 13.3  HCT 39.7  PLT 233   BMET  Recent Labs  12/24/12 1030  NA 139  K 3.4*  CL 102  CO2 28  GLUCOSE 150*  BUN 29*  CREATININE 1.45*  CALCIUM 8.8    Patient still in recovery room, comfortable. Complete relief of radicular pain. Small amount of bloody drainage from wound, dry dressing applied to wound. Patient has voided. Moving all 4 extremities well.  Plan: To begin ambulation in the halls this evening. Continue to progress through postoperative recovery.  Hewitt Shorts, MD 12/26/2012, 6:24 PM

## 2012-12-26 NOTE — Anesthesia Procedure Notes (Signed)
Procedure Name: Intubation Date/Time: 12/26/2012 2:24 PM Performed by: Margaree Mackintosh Pre-anesthesia Checklist: Patient identified, Timeout performed, Emergency Drugs available, Suction available and Patient being monitored Patient Re-evaluated:Patient Re-evaluated prior to inductionOxygen Delivery Method: Circle system utilized Preoxygenation: Pre-oxygenation with 100% oxygen Intubation Type: IV induction Ventilation: Mask ventilation without difficulty Laryngoscope size: Glidescope. Grade View: Grade IV Tube type: Oral Tube size: 7.5 mm Number of attempts: 1 Airway Equipment and Method: Video-laryngoscopy Placement Confirmation: ETT inserted through vocal cords under direct vision,  positive ETCO2 and breath sounds checked- equal and bilateral Secured at: 23 cm Tube secured with: Tape Dental Injury: Teeth and Oropharynx as per pre-operative assessment

## 2012-12-26 NOTE — H&P (Signed)
Subjective: Patient is a 72 y.o. 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 Notably he is status post a right L4-5 lumbar laminotomy and foraminotomy in March 2011, and is status post a left L3-4 lumbar laminotomy and discectomy in August 2013 for a disc herniation with a fragment that had migrated caudally behind the body of L4.  Patient was doing well until 2 months ago when without any particular inciting cause he developed pain in the lateral left side of his low back, exiting at the lateral left posterior iliac crest and down to the left posterior thigh around the left knee. Pain is relieved by sitting or laying, but was worse with turning in bed.  MRI was obtained which revealed a recurrent left L3-4 lumbar disc his with a fragment migrated rostral behind the body of L3. He is admitted now for a left L3-4 lumbar laminotomy and microdiscectomy.   Past Medical History  Diagnosis Date  . Hyperlipidemia     "borderline"  . Depression   . GERD (gastroesophageal reflux disease)   . 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 Chronic back pain   . Anemia     hx    Past Surgical History  Procedure Laterality Date  . Tonsillectomy    . Back surgery    . Lumbar laminectomy/decompression microdiscectomy  09/06/2011    Procedure: LUMBAR LAMINECTOMY/DECOMPRESSION MICRODISCECTOMY 1 LEVEL;  Surgeon: Hewitt Shorts, MD;  Location: [facility];  Service: Neurosurgery;  Laterality: Left;  Left lumbar three-four Lumbar Laminotomy/microdiskectomy  . Polup      removed small intestine    Prescriptions prior to admission  Medication Sig Dispense Refill  . buPROPion (WELLBUTRIN XL) 150 MG 24 hr tablet Take 450 mg by mouth daily.      . furosemide (LASIX) 40 MG tablet Take 40 mg by mouth daily.      . potassium chloride SA (K-DUR,KLOR-CON) 20 MEQ tablet Take 20 mEq by  mouth daily.      . Tamsulosin HCl (FLOMAX) 0.4 MG CAPS Take 1 capsule (0.4 mg total) by mouth daily after breakfast.  30 capsule  0   No Known Allergies  History  Substance Use Topics  . Smoking status: Never Smoker   . Smokeless tobacco: Not on file  . Alcohol Use: No    History reviewed. No pertinent family history.   Review of Systems A comprehensive review of systems was negative.  Objective: Vital signs in last 24 hours: Temp:  [97.3 F (36.3 C)] 97.3 F (36.3 C) (12/04 1044) Pulse Rate:  [93] 93 (12/04 1044) Resp:  [18] 18 (12/04 1044) BP: (193)/(83) 193/83 mmHg (12/04 1044) SpO2:  [100 %] 100 % (12/04 1044)  EXAM: Patient well-developed well-nourished white male in no acute distress. Lungs are clear to auscultation , the patient has symmetrical respiratory excursion. Heart has a regular rate and rhythm normal S1 and S2 no murmur.   Abdomen is soft nontender nondistended bowel sounds are present. Extremity examination shows no clubbing cyanosis or edema. Musculoskeletal examination shows no tenderness to palpation over the lumbar spinous processes or paralumbar musculature. However is significantly limited mobility, and flexion at about 60, and extension at about 0. Motor examination shows 5 over 5 strength in the lower extremities including the iliopsoas quadriceps dorsiflexor extensor hallicus  longus and plantar flexor bilaterally. Sensation is  intact to pinprick in the distal lower extremities. Reflexes left quadriceps is 2, the right quadriceps is trace to 1, the left gastrocnemius is 1-2, the right gastrocnemius is 2. No pathologic reflexes are present. Patient has a gait and stance with a flexed forward posture, he uses a single-point cane to give him support.  Data Review:CBC    Component Value Date/Time   WBC 8.4 12/24/2012 1030   RBC 4.28 12/24/2012 1030   HGB 13.3 12/24/2012 1030   HCT 39.7 12/24/2012 1030   PLT 233 12/24/2012 1030   MCV 92.8 12/24/2012 1030   MCH  31.1 12/24/2012 1030   MCHC 33.5 12/24/2012 1030   RDW 12.8 12/24/2012 1030                          BMET    Component Value Date/Time   NA 139 12/24/2012 1030   K 3.4* 12/24/2012 1030   CL 102 12/24/2012 1030   CO2 28 12/24/2012 1030   GLUCOSE 150* 12/24/2012 1030   BUN 29* 12/24/2012 1030   CREATININE 1.45* 12/24/2012 1030   CALCIUM 8.8 12/24/2012 1030   GFRNONAA 46* 12/24/2012 1030   GFRAA 54* 12/24/2012 1030     Assessment/Plan: Patient with a left lumbar radiculopathy secondary to a 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  I've discussed with the patient the nature of his condition, the nature the surgical procedure, the typical length of surgery, hospital stay, and overall recuperation. We discussed limitations postoperatively. I discussed risks of surgery including risks of infection, bleeding, possibly need for transfusion, the risk of nerve root dysfunction with pain, weakness, numbness, or paresthesias, or risk of dural tear and CSF leakage and possible need for further surgery, the risk of recurrent disc herniation and the possible need for further surgery, and the risk of anesthetic complications including myocardial infarction, stroke, pneumonia, and death. Understanding all this the patient does wish to proceed with surgery and is admitted for such.    Hewitt Shorts, MD 12/26/2012 1:45 PM

## 2012-12-26 NOTE — Transfer of Care (Signed)
Immediate Anesthesia Transfer of Care Note  Patient: Antonio [name]  Procedure(s) Performed: Procedure(s): LUMBAR LAMINECTOMY/DECOMPRESSION MICRODISCECTOMY LEFT LUMBAR THREE-FOUR  (Left)  Patient Location: PACU  Anesthesia Type:General  Level of Consciousness: awake, alert  and oriented  Airway & Oxygen Therapy: Patient Spontanous Breathing  Post-op Assessment: Report given to PACU RN  Post vital signs: Reviewed and stable  Complications: No apparent anesthesia complications

## 2012-12-26 NOTE — Preoperative (Addendum)
Beta Blockers   Reason not to administer Beta Blockers:Not Applicable 

## 2012-12-26 NOTE — Op Note (Signed)
12/26/2012  4:14 PM  PATIENT:  Antonio [name]  72 y.o. 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  ANESTHESIA:   general  EBL:   50 cc  BLOOD ADMINISTERED:none  COUNT: Correct per nursing staff  DICTATION: Patient was brought to the operating room and placed under general endotracheal anesthesia. Patient was turned to prone position the lumbar region was prepped with Betadine soap and solution and draped in a sterile fashion. The midline was infiltrated with local anesthetic with epinephrine. Midline incision was made in the line of the previous midline incision, over the L3-4 level and was carried down through the subcutaneous tissue to the lumbar fascia. The lumbar fascia was incised on the left side and the paraspinal muscles were dissected from the spinous processes and lamina in a subperiosteal fashion. An x-ray was taken and the L3-4 intralaminar space was identified. The operating microscope was draped and brought into the field provided additional magnification, illumination, and visualization. We carefully dissected the scar from the edges of the previous laminotomy, and extended the laminotomy rostrally using the high-speed drill and Kerrison punches. The ligamentum flavum was carefully resected. The underlying thecal sac and left L4 nerve root were identified. We subsequently identified the exiting left L3 nerve root. The  disc herniation was identified rostral to the annulus of the disc space, and beneath the exiting left L3 nerve root and thecal sac. The disc herniation was removed in a piecemeal fashion with a variety of pituitary rongeurs. We used I nerve root gently mobilized the extruded fragments, and achieved good decompression of the thecal sac and exiting nerve roots. Once the discectomy was completed and good decompression of the thecal sac and nerve had been achieved hemostasis was established with the use of bipolar cautery and Gelfoam with thrombin. The Gelfoam was removed and hemostasis confirmed. We then instilled 2 cc of fentanyl and 80 mg of Depo-Medrol into the epidural space. Deep fascia was closed with interrupted undyed 1 Vicryl sutures. Scarpa's fascia was closed with interrupted undyed 1 Vicryl sutures in the subcutaneous and subcuticular layer were closed with interrupted inverted 2-0 undyed Vicryl sutures. The skin edges were approximated with Dermabond. Following surgery the patient was turned back to a supine position to be reversed from the anesthetic extubated and transferred to the recovery room for further care.   PLAN OF CARE: Admit for overnight observation  PATIENT DISPOSITION:  PACU - hemodynamically stable.   Delay start of Pharmacological VTE agent (>24hrs) due to surgical blood loss or risk of bleeding:  yes

## 2012-12-26 NOTE — Anesthesia Postprocedure Evaluation (Signed)
  Anesthesia Post-op Note  Patient: Antonio [name]  Procedure(s) Performed: Procedure(s): LUMBAR LAMINECTOMY/DECOMPRESSION MICRODISCECTOMY LEFT LUMBAR THREE-FOUR  (Left)  Patient Location: PACU  Anesthesia Type:General  Level of Consciousness: awake, alert  and oriented  Airway and Oxygen Therapy: Patient Spontanous Breathing  Post-op Pain: mild  Post-op Assessment: Post-op Vital signs reviewed, Patient's Cardiovascular Status Stable, Respiratory Function Stable, Patent Airway, No signs of Nausea or vomiting and Pain level controlled  Post-op Vital Signs: stable  Complications: No apparent anesthesia complications

## 2012-12-26 NOTE — Anesthesia Preprocedure Evaluation (Signed)
Anesthesia Evaluation  Patient identified by MRN, date of birth, ID band Patient awake    Reviewed: Allergy & Precautions, H&P , NPO status , Patient's Chart, lab work & pertinent test results  Airway Mallampati: II TM Distance: >3 FB     Dental  (+) Teeth Intact and Dental Advisory Given   Pulmonary  breath sounds clear to auscultation        Cardiovascular Rhythm:Regular Rate:Normal     Neuro/Psych    GI/Hepatic   Endo/Other    Renal/GU      Musculoskeletal   Abdominal   Peds  Hematology   Anesthesia Other Findings   Reproductive/Obstetrics                           Anesthesia Physical Anesthesia Plan  ASA: II  Anesthesia Plan: General   Post-op Pain Management:    Induction: Intravenous  Airway Management Planned: Oral ETT  Additional Equipment:   Intra-op Plan:   Post-operative Plan:   Informed Consent: I have reviewed the patients History and Physical, chart, labs and discussed the procedure including the risks, benefits and alternatives for the proposed anesthesia with the patient or authorized representative who has indicated his/her understanding and acceptance.   Dental advisory given  Plan Discussed with: CRNA and Anesthesiologist  Anesthesia Plan Comments: (HNP L3-4 H/O possible difficult intubation, Plan GA with glide scope intubation  Kipp Brood, MD)        Anesthesia Quick Evaluation

## 2012-12-27 ENCOUNTER — Encounter (HOSPITAL_COMMUNITY): Payer: Self-pay | Admitting: Neurosurgery

## 2012-12-27 NOTE — Progress Notes (Signed)
  Pt. Alert and oriented,follows simple instructions, denies pain. Incision area without swelling, redness or S/S of infection. Voiding adequate clear yellow urine. Moving all extremities well and vitals stable and documented. Patient discharged home with spouse. Lumbar surgery notes instructions given to patient and family member for home safety and precautions. Pt. and family stated understanding of instructions given.  

## 2012-12-27 NOTE — Discharge Summary (Signed)
Physician Discharge Summary  Patient ID: Antonio [name] MRN: [id number] DOB/AGE: 1940-05-03 73 y.o.  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 Postoperatively he has had completely for this radicular pain. He has used no narcotic analgesics since surgery. He is up and ambulate actively in the halls. His wound is healing well. He has been given instructions regarding wound care and activities following discharge. He is to return for followup with me in 3 weeks.  Discharge Exam: Blood pressure 162/79, pulse 74, temperature 98 F (36.7 C), temperature source Oral, resp. rate 20, height 5' 4.96" (1.65 m), weight 70.5 kg (155 lb 6.8 oz), SpO2 95.00%.  Disposition: Home     Medication List         buPROPion 150 MG 24 hr tablet  Commonly known as:  WELLBUTRIN XL  Take 450 mg by mouth daily.     furosemide 40 MG tablet  Commonly known as:  LASIX  Take 40 mg by mouth daily.     potassium chloride SA 20 MEQ tablet  Commonly known as:  K-DUR,KLOR-CON  Take 20 mEq by mouth daily.     tamsulosin 0.4 MG Caps capsule  Commonly known as:  FLOMAX  Take 1 capsule (0.4 mg total) by mouth daily after breakfast.         Signed: Hewitt Shorts, MD 12/27/2012, 7:41 AM

## 2013-05-18 ENCOUNTER — Encounter: Payer: Self-pay | Admitting: *Deleted

## 2013-05-18 DIAGNOSIS — E785 Hyperlipidemia, unspecified: Secondary | ICD-10-CM

## 2013-05-21 ENCOUNTER — Ambulatory Visit: Payer: Medicare Other | Attending: Neurosurgery | Admitting: Physical Therapy

## 2013-05-21 DIAGNOSIS — R5381 Other malaise: Secondary | ICD-10-CM | POA: Insufficient documentation

## 2013-05-21 DIAGNOSIS — IMO0001 Reserved for inherently not codable concepts without codable children: Secondary | ICD-10-CM | POA: Diagnosis present

## 2013-05-21 DIAGNOSIS — M545 Low back pain, unspecified: Secondary | ICD-10-CM | POA: Insufficient documentation

## 2013-05-22 ENCOUNTER — Ambulatory Visit: Payer: Medicare Other | Admitting: Physical Therapy

## 2013-05-22 DIAGNOSIS — IMO0001 Reserved for inherently not codable concepts without codable children: Secondary | ICD-10-CM | POA: Diagnosis not present

## 2013-05-26 ENCOUNTER — Ambulatory Visit: Payer: Medicare Other | Attending: Neurosurgery | Admitting: Physical Therapy

## 2013-05-26 DIAGNOSIS — R5381 Other malaise: Secondary | ICD-10-CM | POA: Insufficient documentation

## 2013-05-26 DIAGNOSIS — M545 Low back pain, unspecified: Secondary | ICD-10-CM | POA: Insufficient documentation

## 2013-05-26 DIAGNOSIS — IMO0001 Reserved for inherently not codable concepts without codable children: Secondary | ICD-10-CM | POA: Insufficient documentation

## 2013-05-28 ENCOUNTER — Ambulatory Visit: Payer: Medicare Other | Admitting: Physical Therapy

## 2014-10-26 ENCOUNTER — Other Ambulatory Visit: Payer: Self-pay | Admitting: Internal Medicine

## 2014-10-26 ENCOUNTER — Other Ambulatory Visit: Payer: Self-pay

## 2014-10-26 DIAGNOSIS — R0989 Other specified symptoms and signs involving the circulatory and respiratory systems: Secondary | ICD-10-CM

## 2014-10-29 ENCOUNTER — Ambulatory Visit
Admission: RE | Admit: 2014-10-29 | Discharge: 2014-10-29 | Disposition: A | Discharge status: Home / Self Care | Payer: Medicare Other | Source: Ambulatory Visit | Attending: Internal Medicine | Admitting: Internal Medicine

## 2014-10-29 DIAGNOSIS — R0989 Other specified symptoms and signs involving the circulatory and respiratory systems: Secondary | ICD-10-CM

## 2015-02-25 DIAGNOSIS — Z1211 Encounter for screening for malignant neoplasm of colon: Secondary | ICD-10-CM | POA: Diagnosis not present

## 2015-03-03 DIAGNOSIS — R195 Other fecal abnormalities: Secondary | ICD-10-CM | POA: Diagnosis not present

## 2015-03-04 DIAGNOSIS — R5383 Other fatigue: Secondary | ICD-10-CM | POA: Diagnosis not present

## 2015-03-04 DIAGNOSIS — G4719 Other hypersomnia: Secondary | ICD-10-CM | POA: Diagnosis not present

## 2015-03-08 DIAGNOSIS — Z0100 Encounter for examination of eyes and vision without abnormal findings: Secondary | ICD-10-CM | POA: Diagnosis not present

## 2015-03-08 DIAGNOSIS — H5203 Hypermetropia, bilateral: Secondary | ICD-10-CM | POA: Diagnosis not present

## 2015-03-17 DIAGNOSIS — Z800 Family history of malignant neoplasm of digestive organs: Secondary | ICD-10-CM | POA: Diagnosis not present

## 2015-03-17 DIAGNOSIS — K648 Other hemorrhoids: Secondary | ICD-10-CM | POA: Diagnosis not present

## 2015-03-17 DIAGNOSIS — K5730 Diverticulosis of large intestine without perforation or abscess without bleeding: Secondary | ICD-10-CM | POA: Diagnosis not present

## 2015-03-17 DIAGNOSIS — Z9889 Other specified postprocedural states: Secondary | ICD-10-CM | POA: Diagnosis not present

## 2015-03-17 DIAGNOSIS — R195 Other fecal abnormalities: Secondary | ICD-10-CM | POA: Diagnosis not present

## 2015-03-17 DIAGNOSIS — Z79899 Other long term (current) drug therapy: Secondary | ICD-10-CM | POA: Diagnosis not present

## 2015-03-17 DIAGNOSIS — Z9049 Acquired absence of other specified parts of digestive tract: Secondary | ICD-10-CM | POA: Diagnosis not present

## 2015-03-17 DIAGNOSIS — R69 Illness, unspecified: Secondary | ICD-10-CM | POA: Diagnosis not present

## 2015-03-17 DIAGNOSIS — N400 Enlarged prostate without lower urinary tract symptoms: Secondary | ICD-10-CM | POA: Diagnosis not present

## 2015-03-19 DIAGNOSIS — G4709 Other insomnia: Secondary | ICD-10-CM | POA: Diagnosis not present

## 2015-04-12 DIAGNOSIS — G4733 Obstructive sleep apnea (adult) (pediatric): Secondary | ICD-10-CM | POA: Diagnosis not present

## 2015-04-26 DIAGNOSIS — N183 Chronic kidney disease, stage 3 (moderate): Secondary | ICD-10-CM | POA: Diagnosis not present

## 2015-04-26 DIAGNOSIS — D649 Anemia, unspecified: Secondary | ICD-10-CM | POA: Diagnosis not present

## 2015-04-26 DIAGNOSIS — R030 Elevated blood-pressure reading, without diagnosis of hypertension: Secondary | ICD-10-CM | POA: Diagnosis not present

## 2015-05-05 DIAGNOSIS — G4733 Obstructive sleep apnea (adult) (pediatric): Secondary | ICD-10-CM | POA: Diagnosis not present

## 2015-05-26 DIAGNOSIS — D225 Melanocytic nevi of trunk: Secondary | ICD-10-CM | POA: Diagnosis not present

## 2015-05-26 DIAGNOSIS — D045 Carcinoma in situ of skin of trunk: Secondary | ICD-10-CM | POA: Diagnosis not present

## 2015-06-04 DIAGNOSIS — G4733 Obstructive sleep apnea (adult) (pediatric): Secondary | ICD-10-CM | POA: Diagnosis not present

## 2015-06-28 DIAGNOSIS — G4733 Obstructive sleep apnea (adult) (pediatric): Secondary | ICD-10-CM | POA: Diagnosis not present

## 2015-06-30 DIAGNOSIS — Z08 Encounter for follow-up examination after completed treatment for malignant neoplasm: Secondary | ICD-10-CM | POA: Diagnosis not present

## 2015-06-30 DIAGNOSIS — Z85828 Personal history of other malignant neoplasm of skin: Secondary | ICD-10-CM | POA: Diagnosis not present

## 2015-07-05 DIAGNOSIS — G4733 Obstructive sleep apnea (adult) (pediatric): Secondary | ICD-10-CM | POA: Diagnosis not present

## 2015-08-04 DIAGNOSIS — G4733 Obstructive sleep apnea (adult) (pediatric): Secondary | ICD-10-CM | POA: Diagnosis not present

## 2015-08-09 DIAGNOSIS — G4733 Obstructive sleep apnea (adult) (pediatric): Secondary | ICD-10-CM | POA: Diagnosis not present

## 2015-08-26 DIAGNOSIS — Z0000 Encounter for general adult medical examination without abnormal findings: Secondary | ICD-10-CM | POA: Diagnosis not present

## 2015-08-26 DIAGNOSIS — R69 Illness, unspecified: Secondary | ICD-10-CM | POA: Diagnosis not present

## 2015-08-26 DIAGNOSIS — R030 Elevated blood-pressure reading, without diagnosis of hypertension: Secondary | ICD-10-CM | POA: Diagnosis not present

## 2015-09-04 DIAGNOSIS — G4733 Obstructive sleep apnea (adult) (pediatric): Secondary | ICD-10-CM | POA: Diagnosis not present

## 2015-10-05 DIAGNOSIS — G4733 Obstructive sleep apnea (adult) (pediatric): Secondary | ICD-10-CM | POA: Diagnosis not present

## 2015-11-02 DIAGNOSIS — Z1211 Encounter for screening for malignant neoplasm of colon: Secondary | ICD-10-CM | POA: Diagnosis not present

## 2015-11-02 DIAGNOSIS — Z125 Encounter for screening for malignant neoplasm of prostate: Secondary | ICD-10-CM | POA: Diagnosis not present

## 2015-11-02 DIAGNOSIS — N400 Enlarged prostate without lower urinary tract symptoms: Secondary | ICD-10-CM | POA: Diagnosis not present

## 2015-11-02 DIAGNOSIS — B351 Tinea unguium: Secondary | ICD-10-CM | POA: Diagnosis not present

## 2015-11-02 DIAGNOSIS — R600 Localized edema: Secondary | ICD-10-CM | POA: Diagnosis not present

## 2015-11-02 DIAGNOSIS — N183 Chronic kidney disease, stage 3 (moderate): Secondary | ICD-10-CM | POA: Diagnosis not present

## 2015-11-02 DIAGNOSIS — Z0000 Encounter for general adult medical examination without abnormal findings: Secondary | ICD-10-CM | POA: Diagnosis not present

## 2015-11-02 DIAGNOSIS — R7309 Other abnormal glucose: Secondary | ICD-10-CM | POA: Diagnosis not present

## 2015-11-02 DIAGNOSIS — D509 Iron deficiency anemia, unspecified: Secondary | ICD-10-CM | POA: Diagnosis not present

## 2015-11-02 DIAGNOSIS — M5136 Other intervertebral disc degeneration, lumbar region: Secondary | ICD-10-CM | POA: Diagnosis not present

## 2015-11-02 DIAGNOSIS — E785 Hyperlipidemia, unspecified: Secondary | ICD-10-CM | POA: Diagnosis not present

## 2015-11-04 DIAGNOSIS — G4733 Obstructive sleep apnea (adult) (pediatric): Secondary | ICD-10-CM | POA: Diagnosis not present

## 2015-11-22 DIAGNOSIS — G4733 Obstructive sleep apnea (adult) (pediatric): Secondary | ICD-10-CM | POA: Diagnosis not present

## 2015-11-27 DIAGNOSIS — M25612 Stiffness of left shoulder, not elsewhere classified: Secondary | ICD-10-CM | POA: Diagnosis not present

## 2015-11-27 DIAGNOSIS — Z4789 Encounter for other orthopedic aftercare: Secondary | ICD-10-CM | POA: Diagnosis not present

## 2015-12-05 DIAGNOSIS — G4733 Obstructive sleep apnea (adult) (pediatric): Secondary | ICD-10-CM | POA: Diagnosis not present

## 2015-12-22 DIAGNOSIS — L570 Actinic keratosis: Secondary | ICD-10-CM | POA: Diagnosis not present

## 2015-12-22 DIAGNOSIS — C44311 Basal cell carcinoma of skin of nose: Secondary | ICD-10-CM | POA: Diagnosis not present

## 2015-12-22 DIAGNOSIS — X32XXXD Exposure to sunlight, subsequent encounter: Secondary | ICD-10-CM | POA: Diagnosis not present

## 2015-12-28 DIAGNOSIS — M542 Cervicalgia: Secondary | ICD-10-CM | POA: Diagnosis not present

## 2015-12-28 DIAGNOSIS — B351 Tinea unguium: Secondary | ICD-10-CM | POA: Diagnosis not present

## 2015-12-28 DIAGNOSIS — Z6828 Body mass index (BMI) 28.0-28.9, adult: Secondary | ICD-10-CM | POA: Diagnosis not present

## 2015-12-28 DIAGNOSIS — N183 Chronic kidney disease, stage 3 (moderate): Secondary | ICD-10-CM | POA: Diagnosis not present

## 2015-12-28 DIAGNOSIS — M5030 Other cervical disc degeneration, unspecified cervical region: Secondary | ICD-10-CM | POA: Diagnosis not present

## 2015-12-28 DIAGNOSIS — L03115 Cellulitis of right lower limb: Secondary | ICD-10-CM | POA: Diagnosis not present

## 2015-12-28 DIAGNOSIS — M5412 Radiculopathy, cervical region: Secondary | ICD-10-CM | POA: Diagnosis not present

## 2015-12-28 DIAGNOSIS — R7309 Other abnormal glucose: Secondary | ICD-10-CM | POA: Diagnosis not present

## 2015-12-28 DIAGNOSIS — M4722 Other spondylosis with radiculopathy, cervical region: Secondary | ICD-10-CM | POA: Diagnosis not present

## 2015-12-29 DIAGNOSIS — R69 Illness, unspecified: Secondary | ICD-10-CM | POA: Diagnosis not present

## 2016-01-04 DIAGNOSIS — G4733 Obstructive sleep apnea (adult) (pediatric): Secondary | ICD-10-CM | POA: Diagnosis not present

## 2016-01-04 DIAGNOSIS — N183 Chronic kidney disease, stage 3 (moderate): Secondary | ICD-10-CM | POA: Diagnosis not present

## 2016-01-04 DIAGNOSIS — R7303 Prediabetes: Secondary | ICD-10-CM | POA: Diagnosis not present

## 2016-01-04 DIAGNOSIS — L03115 Cellulitis of right lower limb: Secondary | ICD-10-CM | POA: Diagnosis not present

## 2016-01-04 DIAGNOSIS — I10 Essential (primary) hypertension: Secondary | ICD-10-CM | POA: Diagnosis not present

## 2016-01-11 ENCOUNTER — Emergency Department (HOSPITAL_COMMUNITY)
Admission: EM | Admit: 2016-01-11 | Discharge: 2016-01-12 | Disposition: A | Discharge status: Home / Self Care | Payer: Medicare HMO | Attending: Emergency Medicine | Admitting: Emergency Medicine

## 2016-01-11 ENCOUNTER — Emergency Department (HOSPITAL_COMMUNITY): Payer: Medicare HMO

## 2016-01-11 ENCOUNTER — Encounter (HOSPITAL_COMMUNITY): Payer: Self-pay | Admitting: Emergency Medicine

## 2016-01-11 DIAGNOSIS — R531 Weakness: Secondary | ICD-10-CM

## 2016-01-11 DIAGNOSIS — Z79899 Other long term (current) drug therapy: Secondary | ICD-10-CM | POA: Diagnosis not present

## 2016-01-11 DIAGNOSIS — R2681 Unsteadiness on feet: Secondary | ICD-10-CM | POA: Diagnosis not present

## 2016-01-11 DIAGNOSIS — M7989 Other specified soft tissue disorders: Secondary | ICD-10-CM | POA: Diagnosis not present

## 2016-01-11 DIAGNOSIS — R2689 Other abnormalities of gait and mobility: Secondary | ICD-10-CM | POA: Diagnosis not present

## 2016-01-11 DIAGNOSIS — Z85828 Personal history of other malignant neoplasm of skin: Secondary | ICD-10-CM | POA: Insufficient documentation

## 2016-01-11 DIAGNOSIS — R930 Abnormal findings on diagnostic imaging of skull and head, not elsewhere classified: Secondary | ICD-10-CM | POA: Diagnosis not present

## 2016-01-11 DIAGNOSIS — E876 Hypokalemia: Secondary | ICD-10-CM

## 2016-01-11 DIAGNOSIS — Z5181 Encounter for therapeutic drug level monitoring: Secondary | ICD-10-CM | POA: Diagnosis not present

## 2016-01-11 DIAGNOSIS — I4510 Unspecified right bundle-branch block: Secondary | ICD-10-CM

## 2016-01-11 DIAGNOSIS — R9082 White matter disease, unspecified: Secondary | ICD-10-CM | POA: Diagnosis not present

## 2016-01-11 DIAGNOSIS — N183 Chronic kidney disease, stage 3 (moderate): Secondary | ICD-10-CM | POA: Diagnosis not present

## 2016-01-11 DIAGNOSIS — I10 Essential (primary) hypertension: Secondary | ICD-10-CM | POA: Diagnosis not present

## 2016-01-11 HISTORY — DX: Unspecified right bundle-branch block: I45.10

## 2016-01-11 LAB — COMPREHENSIVE METABOLIC PANEL
ALT: 66 U/L — AB (ref 17–63)
AST: 55 U/L — ABNORMAL HIGH (ref 15–41)
Albumin: 3.7 g/dL (ref 3.5–5.0)
Alkaline Phosphatase: 146 U/L — ABNORMAL HIGH (ref 38–126)
Anion gap: 13 (ref 5–15)
BILIRUBIN TOTAL: 0.9 mg/dL (ref 0.3–1.2)
BUN: 31 mg/dL — ABNORMAL HIGH (ref 6–20)
CHLORIDE: 101 mmol/L (ref 101–111)
CO2: 26 mmol/L (ref 22–32)
CREATININE: 1.67 mg/dL — AB (ref 0.61–1.24)
Calcium: 8.9 mg/dL (ref 8.9–10.3)
GFR calc Af Amer: 44 mL/min — ABNORMAL LOW (ref >60)
GFR, EST NON AFRICAN AMERICAN: 38 mL/min — AB (ref >60)
GLUCOSE: 93 mg/dL (ref 65–99)
POTASSIUM: 2.5 mmol/L — AB (ref 3.5–5.1)
Sodium: 140 mmol/L (ref 135–145)
TOTAL PROTEIN: 6.9 g/dL (ref 6.5–8.1)

## 2016-01-11 LAB — I-STAT CHEM 8, ED
BUN: 31 mg/dL — ABNORMAL HIGH (ref 6–20)
CHLORIDE: 100 mmol/L — AB (ref 101–111)
CREATININE: 1.7 mg/dL — AB (ref 0.61–1.24)
Calcium, Ion: 1.08 mmol/L — ABNORMAL LOW (ref 1.15–1.40)
Glucose, Bld: 94 mg/dL (ref 65–99)
HEMATOCRIT: 34 % — AB (ref 39.0–52.0)
Hemoglobin: 11.6 g/dL — ABNORMAL LOW (ref 13.0–17.0)
Potassium: 2.6 mmol/L — CL (ref 3.5–5.1)
Sodium: 141 mmol/L (ref 135–145)
TCO2: 28 mmol/L (ref 0–100)

## 2016-01-11 LAB — DIFFERENTIAL
BASOS ABS: 0.1 10*3/uL (ref 0.0–0.1)
BASOS PCT: 1 %
EOS PCT: 1 %
Eosinophils Absolute: 0.1 10*3/uL (ref 0.0–0.7)
LYMPHS PCT: 13 %
Lymphs Abs: 1.1 10*3/uL (ref 0.7–4.0)
MONO ABS: 1.5 10*3/uL — AB (ref 0.1–1.0)
Monocytes Relative: 17 %
Neutro Abs: 5.9 10*3/uL (ref 1.7–7.7)
Neutrophils Relative %: 68 %

## 2016-01-11 LAB — CBC
HEMATOCRIT: 34.7 % — AB (ref 39.0–52.0)
HEMOGLOBIN: 10.8 g/dL — AB (ref 13.0–17.0)
MCH: 25.6 pg — ABNORMAL LOW (ref 26.0–34.0)
MCHC: 31.1 g/dL (ref 30.0–36.0)
MCV: 82.2 fL (ref 78.0–100.0)
Platelets: 339 10*3/uL (ref 150–400)
RBC: 4.22 MIL/uL (ref 4.22–5.81)
RDW: 14.9 % (ref 11.5–15.5)
WBC: 8.7 10*3/uL (ref 4.0–10.5)

## 2016-01-11 LAB — MAGNESIUM: Magnesium: 2.6 mg/dL — ABNORMAL HIGH (ref 1.7–2.4)

## 2016-01-11 LAB — I-STAT TROPONIN, ED: TROPONIN I, POC: 0 ng/mL (ref 0.00–0.08)

## 2016-01-11 LAB — PROTIME-INR
INR: 0.96
Prothrombin Time: 12.8 seconds (ref 11.4–15.2)

## 2016-01-11 LAB — APTT: APTT: 33 s (ref 24–36)

## 2016-01-11 MED ORDER — SODIUM CHLORIDE 0.9 % IV SOLN
Freq: Once | INTRAVENOUS | Status: AC
  Administered 2016-01-11: 22:00:00 via INTRAVENOUS
  Filled 2016-01-11: qty 1000

## 2016-01-11 MED ORDER — POTASSIUM CHLORIDE ER 20 MEQ PO TBCR: 40.0000 meq | EXTENDED_RELEASE_TABLET | Freq: Two times a day (BID) | ORAL | 0 refills | Status: DC

## 2016-01-11 MED ORDER — POTASSIUM CHLORIDE CRYS ER 20 MEQ PO TBCR
40.0000 meq | EXTENDED_RELEASE_TABLET | Freq: Once | ORAL | Status: AC
  Administered 2016-01-11: 40 meq via ORAL
  Filled 2016-01-11: qty 2

## 2016-01-11 NOTE — ED Triage Notes (Signed)
Pt here with shuffling gait x 1 month and leaning to the right x 3 days; no obvious neuro deficits noted

## 2016-01-11 NOTE — ED Provider Notes (Signed)
[facility] DEPT Provider Note   CSN: [id number] Arrival date & time: 01/11/16  1653     History   Chief Complaint Chief Complaint  Patient presents with  . Weakness    HPI Antonio [name] is a 75 y.o. male.  The history is provided by the patient.  Weakness  Primary symptoms include no focal weakness, no loss of sensation. Primary symptoms comment: Leaning to the right of the church pew, shuffling gait. This is a new problem. The current episode started 2 days ago. The problem has not changed since onset.Affected Side: Worse on the right side with chronic right leg swelling. There has been no fever. Pertinent negatives include no shortness of breath, no chest pain, no vomiting, no altered mental status and no confusion. Associated medical issues do not include CVA.    Past Medical History:  Diagnosis Date  . Anemia    hx  . 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 Chronic back pain   . Depression   . GERD (gastroesophageal reflux disease)   . Hyperlipidemia    "borderline"    Patient Active Problem List   Diagnosis Date Noted  . Hyperlipidemia   . HNP (herniated nucleus pulposus), lumbar 12/26/2012    Past Surgical History:  Procedure Laterality Date  . BACK SURGERY    . LUMBAR LAMINECTOMY/DECOMPRESSION MICRODISCECTOMY  09/06/2011   Procedure: LUMBAR LAMINECTOMY/DECOMPRESSION MICRODISCECTOMY 1 LEVEL;  Surgeon: Hosie Spangle, MD;  Location: [facility];  Service: Neurosurgery;  Laterality: Left;  Left lumbar three-four Lumbar Laminotomy/microdiskectomy  . LUMBAR LAMINECTOMY/DECOMPRESSION MICRODISCECTOMY Left 12/26/2012   Procedure: LUMBAR LAMINECTOMY/DECOMPRESSION MICRODISCECTOMY LEFT LUMBAR THREE-FOUR ;  Surgeon: Hosie Spangle, MD;  Location: [facility];  Service: Neurosurgery;  Laterality: Left;  . polup     removed small intestine  . TONSILLECTOMY         Home Medications    Prior to Admission medications    Medication Sig Start Date End Date Taking? Authorizing Provider  buPROPion (WELLBUTRIN XL) 150 MG 24 hr tablet Take 450 mg by mouth daily.    Historical Provider, MD  furosemide (LASIX) 40 MG tablet Take 40 mg by mouth daily.    Historical Provider, MD  potassium chloride SA (K-DUR,KLOR-CON) 20 MEQ tablet Take 20 mEq by mouth daily.    Historical Provider, MD  Tamsulosin HCl (FLOMAX) 0.4 MG CAPS Take 1 capsule (0.4 mg total) by mouth daily after breakfast. 09/08/11   Kary Kos, MD    Family History Family History  Problem Relation Age of Onset  . Cancer Mother   . Cancer Father   . Diabetes Mellitus I Brother     Social History Social History  Substance Use Topics  . Smoking status: Never Smoker  . Smokeless tobacco: Not on file  . Alcohol use No     Allergies   Patient has no known allergies.   Review of Systems Review of Systems  Respiratory: Negative for shortness of breath.   Cardiovascular: Negative for chest pain.  Gastrointestinal: Negative for vomiting.  Neurological: Positive for weakness. Negative for focal weakness.  Psychiatric/Behavioral: Negative for confusion.     Physical Exam Updated Vital Signs BP (!) 136/52 (BP Location: Right Arm)   Pulse 65   Temp 98.1 F (36.7 C) (Oral)   Resp 15   Ht 5\' 4"  (1.626 m)   Wt 155 lb (70.3 kg)   SpO2 99%   BMI 26.61 kg/m  Physical Exam  Constitutional: He is oriented to person, place, and time. He appears well-developed and well-nourished. No distress.  HENT:  Head: Normocephalic and atraumatic.  Nose: Nose normal.  Eyes: Conjunctivae are normal.  Neck: Neck supple. No tracheal deviation present.  Cardiovascular: Normal rate, regular rhythm and normal heart sounds.   Pulmonary/Chest: Effort normal and breath sounds normal. No respiratory distress.  Abdominal: Soft. He exhibits no distension.  Musculoskeletal:  2+ right leg pitting edema with overlying stasis dermatitis and a  centimeter scabbing  lesion over the anterior shin. No erythema, fluctuance, or warmth is noted  Neurological: He is alert and oriented to person, place, and time. No cranial nerve deficit. Coordination normal.  Normal finger to nose testing and rapid alternating movement. Walks with a steady gait and normal balance  Skin: Skin is warm and dry.  Psychiatric: He has a normal mood and affect.  Vitals reviewed.    ED Treatments / Results  Labs (all labs ordered are listed, but only abnormal results are displayed) Labs Reviewed  CBC - Abnormal; Notable for the following:       Result Value   Hemoglobin 10.8 (*)    HCT 34.7 (*)    MCH 25.6 (*)    All other components within normal limits  DIFFERENTIAL - Abnormal; Notable for the following:    Monocytes Absolute 1.5 (*)    All other components within normal limits  COMPREHENSIVE METABOLIC PANEL - Abnormal; Notable for the following:    Potassium 2.5 (*)    BUN 31 (*)    Creatinine, Ser 1.67 (*)    AST 55 (*)    ALT 66 (*)    Alkaline Phosphatase 146 (*)    GFR calc non Af Amer 38 (*)    GFR calc Af Amer 44 (*)    All other components within normal limits  MAGNESIUM - Abnormal; Notable for the following:    Magnesium 2.6 (*)    All other components within normal limits  I-STAT CHEM 8, ED - Abnormal; Notable for the following:    Potassium 2.6 (*)    Chloride 100 (*)    BUN 31 (*)    Creatinine, Ser 1.70 (*)    Calcium, Ion 1.08 (*)    Hemoglobin 11.6 (*)    HCT 34.0 (*)    All other components within normal limits  PROTIME-INR  APTT  I-STAT TROPOININ, ED  CBG MONITORING, ED    EKG  EKG Interpretation  Date/Time:  Tuesday January 11 2016 17:03:57 EST Ventricular Rate:  66 PR Interval:  154 QRS Duration: 168 QT Interval:  480 QTC Calculation: 503 R Axis:   -164 Text Interpretation:  Normal sinus rhythm Right bundle branch block No significant change since last tracing Confirmed by Taylormarie Register MD, Darya Bigler [phone]) on 01/11/2016 8:38:45 PM         Radiology Ct Head Wo Contrast  Result Date: 01/11/2016 CLINICAL DATA:  Pt reports weakness in right leg x 1 week. Sts it's been going on for months , went away and now back strongly. Sts he shuffles his right leg when walking, gets tired and cannot walk anymore. Pt says he's on HBP medication. EXAM: CT HEAD WITHOUT CONTRAST TECHNIQUE: Contiguous axial images were obtained from the base of the skull through the vertex without intravenous contrast. COMPARISON:  None. FINDINGS: Brain: No evidence of acute infarction, hemorrhage, hydrocephalus, extra-axial collection or mass lesion/mass effect. There are periventricular and subcortical white matter hypodensities. Generalized cortical atrophy.  Vascular: No hyperdense vessel or unexpected calcification. Skull: Normal. Negative for fracture or focal lesion. Sinuses/Orbits: No acute finding. Other: None. IMPRESSION: 1. No acute intracranial findings. 2. Atrophy and white matter microvascular disease Electronically Signed   By: Suzy Bouchard M.D.   On: 01/11/2016 17:44    Procedures Procedures (including critical care time)  Medications Ordered in ED Medications  potassium chloride SA (K-DUR,KLOR-CON) CR tablet 40 mEq (not administered)  sodium chloride 0.9 % 1,000 mL with potassium chloride 80 mEq infusion (not administered)     Initial Impression / Assessment and Plan / ED Course  I have reviewed the triage vital signs and the nursing notes.  Pertinent labs & imaging results that were available during my care of the patient were reviewed by me and considered in my medical decision making (see chart for details).  Clinical Course    75 year old male presents with feeling that he was leaning to the right in church 2 days ago. He states that he has had ongoing issues with his gait which has been shuffling in nature and he leans forward when he walks which concerns his wife. He has felt weak over the last few days. He has multiple medical  comorbidities, a stroke workup was initiated from triage and despite over 48 hours of symptoms there is no evidence of ischemia on his head CT.  Patient has notable hypokalemia to 2.5, he is on a thiazide diuretic at home which may be the cause and could certainly result in some weakness. No chest pain, shortness of breath, no other high risk features for atypical ACS and troponin is negative. Patient denies any urinary symptoms.  Patient has been taking 20 mEq potassium supplementation daily and has known CKD stage III which appears to be at his stated baseline. Potassium will be repleted 30 mEq by IV over 3 hours here and the patient will be placed on 40 mEq twice a day at home with plan to check potassium a later in the week at his primary care office.  He has secondary complaint of chronic right lower extremity swelling that demonstrates signs of stasis dermatitis from chronic swelling. There is a small skin defect and he has been on antibiotics for 7 days for a presumed cellulitis. He was sent from primary office to rule out DVT but he states that the swelling has been there for several years and is essentially unchanged. We do not have vascular ultrasound capability at night and I do not feel that the patient will require emergent rule out given his prolonged course of symptoms and response to antibiotic therapy. Return precautions discussed for worsening or new concerning symptoms.   Final Clinical Impressions(s) / ED Diagnoses   Final diagnoses:  Weakness  Hypokalemia    New Prescriptions New Prescriptions   POTASSIUM CHLORIDE 20 MEQ TBCR    Take 40 mEq by mouth 2 (two) times daily.     Leo Grosser, MD 01/11/16 [phone]

## 2016-01-11 NOTE — ED Notes (Signed)
Called lab advised to add Mag.

## 2016-01-11 NOTE — ED Notes (Signed)
EDP CI alerted of abnormal lab

## 2016-02-02 DIAGNOSIS — L570 Actinic keratosis: Secondary | ICD-10-CM | POA: Diagnosis not present

## 2016-02-02 DIAGNOSIS — D485 Neoplasm of uncertain behavior of skin: Secondary | ICD-10-CM | POA: Diagnosis not present

## 2016-02-02 DIAGNOSIS — X32XXXD Exposure to sunlight, subsequent encounter: Secondary | ICD-10-CM | POA: Diagnosis not present

## 2016-02-02 DIAGNOSIS — Z08 Encounter for follow-up examination after completed treatment for malignant neoplasm: Secondary | ICD-10-CM | POA: Diagnosis not present

## 2016-02-02 DIAGNOSIS — Z85828 Personal history of other malignant neoplasm of skin: Secondary | ICD-10-CM | POA: Diagnosis not present

## 2016-02-02 DIAGNOSIS — D225 Melanocytic nevi of trunk: Secondary | ICD-10-CM | POA: Diagnosis not present

## 2016-02-03 DIAGNOSIS — L03115 Cellulitis of right lower limb: Secondary | ICD-10-CM | POA: Diagnosis not present

## 2016-02-03 DIAGNOSIS — E876 Hypokalemia: Secondary | ICD-10-CM | POA: Diagnosis not present

## 2016-02-03 DIAGNOSIS — N183 Chronic kidney disease, stage 3 (moderate): Secondary | ICD-10-CM | POA: Diagnosis not present

## 2016-02-03 DIAGNOSIS — R2681 Unsteadiness on feet: Secondary | ICD-10-CM | POA: Diagnosis not present

## 2016-02-03 DIAGNOSIS — D509 Iron deficiency anemia, unspecified: Secondary | ICD-10-CM | POA: Diagnosis not present

## 2016-02-03 DIAGNOSIS — I10 Essential (primary) hypertension: Secondary | ICD-10-CM | POA: Diagnosis not present

## 2016-02-03 DIAGNOSIS — D6489 Other specified anemias: Secondary | ICD-10-CM | POA: Diagnosis not present

## 2016-02-04 ENCOUNTER — Ambulatory Visit (INDEPENDENT_AMBULATORY_CARE_PROVIDER_SITE_OTHER): Payer: Medicare HMO

## 2016-02-04 ENCOUNTER — Ambulatory Visit (INDEPENDENT_AMBULATORY_CARE_PROVIDER_SITE_OTHER): Payer: Medicare HMO | Admitting: Podiatry

## 2016-02-04 ENCOUNTER — Encounter: Payer: Self-pay | Admitting: Podiatry

## 2016-02-04 VITALS — BP 141/70 | HR 80 | Resp 18

## 2016-02-04 DIAGNOSIS — M779 Enthesopathy, unspecified: Secondary | ICD-10-CM

## 2016-02-04 DIAGNOSIS — M79675 Pain in left toe(s): Secondary | ICD-10-CM | POA: Diagnosis not present

## 2016-02-04 DIAGNOSIS — L603 Nail dystrophy: Secondary | ICD-10-CM | POA: Diagnosis not present

## 2016-02-04 DIAGNOSIS — M205X1 Other deformities of toe(s) (acquired), right foot: Secondary | ICD-10-CM

## 2016-02-04 DIAGNOSIS — M79674 Pain in right toe(s): Secondary | ICD-10-CM | POA: Diagnosis not present

## 2016-02-04 DIAGNOSIS — L03115 Cellulitis of right lower limb: Secondary | ICD-10-CM

## 2016-02-04 DIAGNOSIS — M7741 Metatarsalgia, right foot: Secondary | ICD-10-CM

## 2016-02-04 DIAGNOSIS — G4733 Obstructive sleep apnea (adult) (pediatric): Secondary | ICD-10-CM | POA: Diagnosis not present

## 2016-02-04 DIAGNOSIS — B351 Tinea unguium: Secondary | ICD-10-CM | POA: Diagnosis not present

## 2016-02-04 NOTE — Progress Notes (Signed)
   Subjective:    Patient ID: Antonio [name], male    DOB: October 30, 1940, 76 y.o.   MRN: [id number]  HPI  76 year old male presents the office today for concerns of thick, painful, elongated toenails that he cannot trim himself. He denies any redness or drainage coming from the toenail sites. They're inquiring about possible treatment options for nail fungus. He has cellulitis of his right leg he is currently on antibiotics and being treated by his primary care physician. His wife states that the leg is doing much better since starting antibiotics. He's been having some pain in the ball of his right foot for several months. He denies any recent injury or trauma. Denies any swelling or redness. He has no systemic complaints such as fevers, chills, nausea, vomiting. Denies any calf pain, chest pain, shortness of breath. No other complaints today.    Review of Systems  Cardiovascular: Positive for leg swelling.  Genitourinary: Positive for urgency.  Musculoskeletal: Positive for gait problem.  Skin:       Change in nails  Neurological: Positive for weakness.  Hematological: Bruises/bleeds easily.  All other systems reviewed and are negative.      Objective:   Physical Exam General: AAO x3, NAD  Dermatological: Nails are hypertrophic, dystrophic, brittle, discolored, elongated 10. No surrounding redness or drainage. Tenderness nails 1-5 bilaterally. There is faint, resolving cellulitis to the right leg. There is no significant increase in warmth. There are no open lesions. No fluctuance, crepitance, drainage. No open lesions or pre-ulcerative lesions are identified today.  Vascular: Dorsalis Pedis artery and Posterior Tibial artery pedal pulses are palpable bilateral with immedate capillary fill time. Pedal hair growth present. There is no pain with calf compression.   Neruologic: Grossly intact via light touch bilateral. Vibratory intact via tuning fork bilateral.   Musculoskeletal: There  is restriction of the first MTPJ range of motion and is crepitation range of motion. There is mild tenderness submetatarsal 2. There is no pain with dorsal aspect of the metatarsals. There is no overlying edema, erythema, increase in warmth. There is no area pinpoint tenderness or pain the vibratory sensation otherwise. MMT 5/5. Range of motion intact otherwise.    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 -Treatment options discussed including all alternatives, risks, and complications -Etiology of symptoms were discussed -X-rays were obtained and reviewed with the patient. Arthritic changes are present first MTPJ. No evidence of acute fracture. -Offloading pads were dispensed to the submetatarsal. Discussed orthotics as well as shoe gear changes. -Nails were debrided 10 without complications or bleeding. This was sent to [facility] for evaluation of nail fungus. Discussed treatment options, pending culture results -Finish course of abx for cellulitis. I do not think this is coming from the foot as they were concerned about this. There are no clinical signs of infection to the foot.   Celesta Gentile, DPM

## 2016-02-25 ENCOUNTER — Ambulatory Visit (INDEPENDENT_AMBULATORY_CARE_PROVIDER_SITE_OTHER): Payer: Medicare HMO | Admitting: Podiatry

## 2016-02-25 ENCOUNTER — Encounter: Payer: Self-pay | Admitting: Podiatry

## 2016-02-25 DIAGNOSIS — Z79899 Other long term (current) drug therapy: Secondary | ICD-10-CM | POA: Diagnosis not present

## 2016-02-25 DIAGNOSIS — M205X1 Other deformities of toe(s) (acquired), right foot: Secondary | ICD-10-CM

## 2016-02-25 DIAGNOSIS — B351 Tinea unguium: Secondary | ICD-10-CM | POA: Diagnosis not present

## 2016-02-25 LAB — HEPATIC FUNCTION PANEL
ALT: 21 U/L (ref 9–46)
AST: 17 U/L (ref 10–35)
Albumin: 3.6 g/dL (ref 3.6–5.1)
Alkaline Phosphatase: 103 U/L (ref 40–115)
Bilirubin, Direct: 0.1 mg/dL (ref <=0.2)
Indirect Bilirubin: 0.2 mg/dL (ref 0.2–1.2)
TOTAL PROTEIN: 6.5 g/dL (ref 6.1–8.1)
Total Bilirubin: 0.3 mg/dL (ref 0.2–1.2)

## 2016-02-25 LAB — CBC WITH DIFFERENTIAL/PLATELET
Basophils Absolute: 97 cells/uL (ref 0–200)
Basophils Relative: 1 %
EOS ABS: 291 {cells}/uL (ref 15–500)
Eosinophils Relative: 3 %
HCT: 32.9 % — ABNORMAL LOW (ref 38.5–50.0)
Hemoglobin: 10.2 g/dL — ABNORMAL LOW (ref 13.2–17.1)
LYMPHS PCT: 12 %
Lymphs Abs: 1164 cells/uL (ref 850–3900)
MCH: 26.3 pg — ABNORMAL LOW (ref 27.0–33.0)
MCHC: 31 g/dL — ABNORMAL LOW (ref 32.0–36.0)
MCV: 84.8 fL (ref 80.0–100.0)
MONOS PCT: 13 %
MPV: 9.5 fL (ref 7.5–12.5)
Monocytes Absolute: 1261 cells/uL — ABNORMAL HIGH (ref 200–950)
Neutro Abs: 6887 cells/uL (ref 1500–7800)
Neutrophils Relative %: 71 %
Platelets: 337 10*3/uL (ref 140–400)
RBC: 3.88 MIL/uL — ABNORMAL LOW (ref 4.20–5.80)
RDW: 18 % — AB (ref 11.0–15.0)
WBC: 9.7 10*3/uL (ref 3.8–10.8)

## 2016-02-25 MED ORDER — TERBINAFINE HCL 250 MG PO TABS: 250.0000 mg | ORAL_TABLET | Freq: Every day | ORAL | 0 refills | Status: DC

## 2016-02-25 NOTE — Patient Instructions (Signed)
Terbinafine oral granules What is this medicine? TERBINAFINE (TER bin a feen) is an antifungal medicine. It is used to treat certain kinds of fungal or yeast infections. COMMON BRAND NAME(S): Lamisil What should I tell my health care provider before I take this medicine? They need to know if you have any of these conditions: -drink alcoholic beverages -kidney disease -liver disease -an unusual or allergic reaction to Terbinafine, other medicines, foods, dyes, or preservatives -pregnant or trying to get pregnant -breast-feeding How should I use this medicine? Take this medicine by mouth. Follow the directions on the prescription label. Hold packet with cut line on top. Shake packet gently to settle contents. Tear packet open along cut line, or use scissors to cut across line. Carefully pour the entire contents of packet onto a spoonful of a soft food, such as pudding or other soft, non-acidic food such as mashed potatoes (do NOT use applesauce or a fruit-based food). If two packets are required for each dose, you may either sprinkle the content of both packets on one spoonful of non-acidic food, or sprinkle the contents of both packets on two spoonfuls of non-acidic food. Make sure that no granules remain in the packet. Swallow the mxiture of the food and granules without chewing. Take your medicine at regular intervals. Do not take it more often than directed. Take all of your medicine as directed even if you think you are better. Do not skip doses or stop your medicine early. Contact your pediatrician or health care professional regarding the use of this medicine in children. While this medicine may be prescribed for children as young as 4 years for selected conditions, precautions do apply. What if I miss a dose? If you miss a dose, take it as soon as you can. If it is almost time for your next dose, take only that dose. Do not take double or extra doses. What may interact with this medicine? Do  not take this medicine with any of the following medications: -thioridazine This medicine may also interact with the following medications: -beta-blockers -caffeine -cimetidine -cyclosporine -MAOIs like Carbex, Eldepryl, Marplan, Nardil, and Parnate -medicines for fungal infections like fluconazole and ketoconazole -medicines for irregular heartbeat like amiodarone, flecainide and propafenone -rifampin -SSRIs like citalopram, escitalopram, fluoxetine, fluvoxamine, paroxetine and sertraline -tricyclic antidepressants like amitriptyline, clomipramine, desipramine, imipramine, nortriptyline, and others -warfarin What should I watch for while using this medicine? Your doctor may monitor your liver function. Tell your doctor right away if you have nausea or vomiting, loss of appetite, stomach pain on your right upper side, yellow skin, dark urine, light stools, or are over tired. You need to take this medicine for 6 weeks or longer to cure the fungal infection. Take your medicine regularly for as long as your doctor or health care professional tells you to. What side effects may I notice from receiving this medicine? Side effects that you should report to your doctor or health care professional as soon as possible: -allergic reactions like skin rash or hives, swelling of the face, lips, or tongue -change in vision -dark urine -fever or infection -general ill feeling or flu-like symptoms -light-colored stools -loss of appetite, nausea -redness, blistering, peeling or loosening of the skin, including inside the mouth -right upper belly pain -unusually weak or tired -yellowing of the eyes or skin Side effects that usually do not require medical attention (report to your doctor or health care professional if they continue or are bothersome): -changes in taste -diarrhea -hair loss -muscle  or joint pain -stomach upset Where should I keep my medicine? Keep out of the reach of  children. Store at room temperature between 15 and 30 degrees C (59 and 86 degrees F). Throw away any unused medicine after the expiration date.  2017 [employer]vier/Gold Standard (2007-03-22 17:25:48)

## 2016-02-28 ENCOUNTER — Telehealth: Payer: Self-pay | Admitting: *Deleted

## 2016-02-28 ENCOUNTER — Ambulatory Visit: Payer: Medicare Other | Admitting: Neurology

## 2016-02-28 NOTE — Telephone Encounter (Addendum)
-----   Message from Trula Slade, DPM sent at 02/28/2016  7:04 AM EST ----- OK to start lamsil. Please let him know. Please send CBC to his PCP. Left message for pt to call for results. 03/13/2016-I informed pt of Dr. Leigh Aurora orders and that I need to send CBC to his PCP. Pt states his Dr. Leeroy Cha [phone] [facility] at [facility] on [address]. 02/202/2018-Faxed Bloodwork of 02/25/2016 to Dr. Leeroy Cha.

## 2016-02-28 NOTE — Progress Notes (Signed)
Subjective: 76 year old male presents the also discussed nail culture results. He states his toenails are doing about the same as they were last appointment. He also states that given the right big toe he has difficulty finding shoes that are comfortable and fit. Denies any recent injury or trauma. Denies any swelling or redness has noticed. Denies any systemic complaints such as fevers, chills, nausea, vomiting. No acute changes since last appointment, and no other complaints at this time.   Objective: AAO x3, NAD DP/PT pulses palpable bilaterally, CRT less than 3 seconds Nails are hypertrophic, dystrophic, brittle, discolored10. No surrounding redness or drainage. There is no tenderness  To nails 1-5 bilaterally. No open lesions or pre-ulcerative lesions are identified today. Cellulitis to the leg is much improved Decreased ROM of the 1st MTPJ on the right. There are no other areas of tenderness to bilateral feet. There is no swelling.  No open lesions or pre-ulcerative lesions.  No pain with calf compression, swelling, warmth, erythema  Assessment: Onychomycosis; hallux limitus   Plan: -All treatment options discussed with the patient including all alternatives, risks, complications.  -Nail culture results were discussed with the patient. After discussion with him and his wife elected to proceed with oral Lamisil. This was prescribed today as well as blood work prior to starting the medication and he understood this. Discussed side effects the medication as well. -Discussed custom orthotics to help support his first MPJ. Also discussed with him. Shoes changes. -Follow-up 6 weeks. -Patient encouraged to call the office with any questions, concerns, change in symptoms.   Celesta Gentile, DPM
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
## 2016-03-02 ENCOUNTER — Telehealth: Payer: Self-pay | Admitting: Neurology

## 2016-03-02 ENCOUNTER — Encounter: Payer: Self-pay | Admitting: Neurology

## 2016-03-02 ENCOUNTER — Ambulatory Visit: Payer: Medicare Other | Admitting: Neurology

## 2016-03-02 ENCOUNTER — Ambulatory Visit (INDEPENDENT_AMBULATORY_CARE_PROVIDER_SITE_OTHER): Payer: Medicare HMO | Admitting: Neurology

## 2016-03-02 VITALS — BP 128/70 | HR 76 | Ht 65.0 in | Wt 156.4 lb

## 2016-03-02 DIAGNOSIS — R2681 Unsteadiness on feet: Secondary | ICD-10-CM | POA: Diagnosis not present

## 2016-03-02 DIAGNOSIS — R292 Abnormal reflex: Secondary | ICD-10-CM

## 2016-03-02 DIAGNOSIS — R531 Weakness: Secondary | ICD-10-CM

## 2016-03-02 DIAGNOSIS — R296 Repeated falls: Secondary | ICD-10-CM | POA: Diagnosis not present

## 2016-03-02 DIAGNOSIS — F0280 Dementia in other diseases classified elsewhere without behavioral disturbance: Secondary | ICD-10-CM

## 2016-03-02 DIAGNOSIS — F444 Conversion disorder with motor symptom or deficit: Secondary | ICD-10-CM | POA: Diagnosis not present

## 2016-03-02 DIAGNOSIS — R69 Illness, unspecified: Secondary | ICD-10-CM | POA: Diagnosis not present

## 2016-03-02 NOTE — Telephone Encounter (Signed)
Antonio [name] 12/26/1940. Her #[id number] Rosamond (wife) called and had a question regarding him not being able to drive until after his Neurocognitive Testing which will be in March. She wanted to know why?  Thank you

## 2016-03-02 NOTE — Patient Instructions (Addendum)
1. We have sent a referral to [facility] for your MRI and they will call you directly to schedule your appt. They are located at [place]. If you need to contact them directly please call [phone].  2. You have been referred for a neurocognitive evaluation in our office.   The evaluation consists of three appointments.   1. The first appointment is about 45 minutes and is a clinical interview with the neuropsychologist (Dr. Macarthur Critchley). Please bring someone with you to this appointment if possible, as it is helpful for Dr. Si Raider to hear from both you and another adult who knows you well.   2. The second appointment is 2-3 hours long and is with the psychometrician Milana Kidney). You will complete a variety of tasks- mostly question-and-answer, some paper-and-pencil. There is nothing you need to do to prepare for this appointment, but having a good night's sleep prior to the testing, and bringing eyeglasses and hearing aids (if you wear them), is advised.   3. The final appointment is a follow-up with Dr. Si Raider where she will go over the test results with you and provide recommendations and a plan of care. This appointment is about 30 minutes.  If you would like a family member to receive this information as well, please bring them to the appointment.   We have to reserve several hours of the neuropsychologist's time and the psychometrician's time for your appointment. As such, please note that there is a No-Show fee of $100. If you are unable to attend any of your appointments, please contact our office as soon as possible to reschedule.
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
## 2016-03-03 ENCOUNTER — Telehealth: Payer: Self-pay | Admitting: Neurology

## 2016-03-03 DIAGNOSIS — R2681 Unsteadiness on feet: Secondary | ICD-10-CM

## 2016-03-03 DIAGNOSIS — R531 Weakness: Secondary | ICD-10-CM

## 2016-03-03 NOTE — Telephone Encounter (Signed)
The patient and wife know that I reviewed labs from primary care physician.  Does not look like he has had any thyroid testing in the last year.  Needs TSH, B12, folate, RPR.  Can draw on day comes in for neuropsych testing

## 2016-03-06 DIAGNOSIS — G4733 Obstructive sleep apnea (adult) (pediatric): Secondary | ICD-10-CM | POA: Diagnosis not present

## 2016-03-06 NOTE — Telephone Encounter (Signed)
Patient made aware. Lab orders entered. They will check in to the lab when he comes for neuropsych testing.

## 2016-03-08 ENCOUNTER — Telehealth: Payer: Self-pay | Admitting: Neurology

## 2016-03-08 NOTE — Telephone Encounter (Signed)
Nicole Kindred with [employer] left a message that a denial is pending/Ref# [id number] CB# [id number] OPT# 4, OPT#2/Dawn

## 2016-03-09 ENCOUNTER — Telehealth: Payer: Self-pay | Admitting: Neurology

## 2016-03-09 DIAGNOSIS — G4733 Obstructive sleep apnea (adult) (pediatric): Secondary | ICD-10-CM | POA: Diagnosis not present

## 2016-03-09 NOTE — Telephone Encounter (Signed)
Insurance denied MRI lumbar spine.  Said to do PT.  I asked the non clinical lady on the phone exactly how that was going to help the fact he had no reflexes in his right knee and she stated she was not clinical.  Refuses to get a physician for a peer-to-peer and stated that they could not do a peer-to-peer today as they were old but.  Stated that a peer-to-peer had to be done by tomorrow, however, it would be denied.  Tried to schedule it at 9:15 AM tomorrow but I had patients scheduled.  Finally was able to schedule it at 11:45 AM tomorrow.

## 2016-03-10 NOTE — Telephone Encounter (Signed)
Spoke with physician and got approval for MRI lumbar spine.  Approval is same as the MRI brain and is [id number].  Caryl Pina, could you make sure that imaging facility has the information.

## 2016-03-10 NOTE — Telephone Encounter (Signed)
PA given to Yoder.

## 2016-03-14 ENCOUNTER — Ambulatory Visit: Payer: Medicare Other | Admitting: Neurology

## 2016-03-16 DIAGNOSIS — S0181XA Laceration without foreign body of other part of head, initial encounter: Secondary | ICD-10-CM | POA: Diagnosis not present

## 2016-03-21 DIAGNOSIS — H11052 Peripheral pterygium, progressive, left eye: Secondary | ICD-10-CM | POA: Diagnosis not present

## 2016-03-21 DIAGNOSIS — H524 Presbyopia: Secondary | ICD-10-CM | POA: Diagnosis not present

## 2016-03-21 DIAGNOSIS — H2513 Age-related nuclear cataract, bilateral: Secondary | ICD-10-CM | POA: Diagnosis not present

## 2016-03-21 DIAGNOSIS — H5203 Hypermetropia, bilateral: Secondary | ICD-10-CM | POA: Diagnosis not present

## 2016-03-21 DIAGNOSIS — H04123 Dry eye syndrome of bilateral lacrimal glands: Secondary | ICD-10-CM | POA: Diagnosis not present

## 2016-03-21 DIAGNOSIS — H43812 Vitreous degeneration, left eye: Secondary | ICD-10-CM | POA: Diagnosis not present

## 2016-03-21 DIAGNOSIS — H52223 Regular astigmatism, bilateral: Secondary | ICD-10-CM | POA: Diagnosis not present

## 2016-03-24 ENCOUNTER — Ambulatory Visit
Admission: RE | Admit: 2016-03-24 | Discharge: 2016-03-24 | Disposition: A | Discharge status: Home / Self Care | Payer: Medicare HMO | Source: Ambulatory Visit | Attending: Neurology | Admitting: Neurology

## 2016-03-24 DIAGNOSIS — M48061 Spinal stenosis, lumbar region without neurogenic claudication: Secondary | ICD-10-CM | POA: Diagnosis not present

## 2016-03-24 DIAGNOSIS — R531 Weakness: Secondary | ICD-10-CM

## 2016-03-24 DIAGNOSIS — R2681 Unsteadiness on feet: Secondary | ICD-10-CM

## 2016-03-24 DIAGNOSIS — F0280 Dementia in other diseases classified elsewhere without behavioral disturbance: Secondary | ICD-10-CM

## 2016-03-24 DIAGNOSIS — R296 Repeated falls: Secondary | ICD-10-CM

## 2016-03-24 DIAGNOSIS — R292 Abnormal reflex: Secondary | ICD-10-CM

## 2016-03-24 DIAGNOSIS — F444 Conversion disorder with motor symptom or deficit: Secondary | ICD-10-CM

## 2016-03-27 ENCOUNTER — Telehealth: Payer: Self-pay | Admitting: Neurology

## 2016-03-27 NOTE — Telephone Encounter (Signed)
Mychart message sent to patient.

## 2016-03-27 NOTE — Telephone Encounter (Signed)
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  Lanell Dubie, just let pt/wife know that nothing new but lots of hardening of arteries in brain

## 2016-03-27 NOTE — Telephone Encounter (Signed)
-----   Message from [place], DO sent at 03/27/2016  7:48 AM EST ----- Luvenia Starch, let pt know that there is def reason for no reflex in that right leg and potential back pain.  He has pinched nerves in back coming down to legs at several levels.  Would he like neurosx opinion?  He has seen neurosx in past

## 2016-03-28 NOTE — Telephone Encounter (Signed)
Patient's wife called and wanted to have records faxed to Neurosurgeon Dr. Rita Ohara.  Records faxed to [phone] with confirmation received.

## 2016-04-05 DIAGNOSIS — I872 Venous insufficiency (chronic) (peripheral): Secondary | ICD-10-CM | POA: Diagnosis not present

## 2016-04-05 DIAGNOSIS — Z79899 Other long term (current) drug therapy: Secondary | ICD-10-CM | POA: Diagnosis not present

## 2016-04-05 DIAGNOSIS — N183 Chronic kidney disease, stage 3 (moderate): Secondary | ICD-10-CM | POA: Diagnosis not present

## 2016-04-05 DIAGNOSIS — I10 Essential (primary) hypertension: Secondary | ICD-10-CM | POA: Diagnosis not present

## 2016-04-05 DIAGNOSIS — R69 Illness, unspecified: Secondary | ICD-10-CM | POA: Diagnosis not present

## 2016-04-05 DIAGNOSIS — G4733 Obstructive sleep apnea (adult) (pediatric): Secondary | ICD-10-CM | POA: Diagnosis not present

## 2016-04-12 DIAGNOSIS — G8929 Other chronic pain: Secondary | ICD-10-CM | POA: Diagnosis not present

## 2016-04-12 DIAGNOSIS — M5136 Other intervertebral disc degeneration, lumbar region: Secondary | ICD-10-CM | POA: Diagnosis not present

## 2016-04-12 DIAGNOSIS — R296 Repeated falls: Secondary | ICD-10-CM | POA: Diagnosis not present

## 2016-04-12 DIAGNOSIS — M545 Low back pain: Secondary | ICD-10-CM | POA: Diagnosis not present

## 2016-04-12 DIAGNOSIS — M47816 Spondylosis without myelopathy or radiculopathy, lumbar region: Secondary | ICD-10-CM | POA: Diagnosis not present

## 2016-04-12 DIAGNOSIS — R2689 Other abnormalities of gait and mobility: Secondary | ICD-10-CM | POA: Diagnosis not present

## 2016-04-18 ENCOUNTER — Telehealth: Payer: Self-pay | Admitting: Neurology

## 2016-04-18 ENCOUNTER — Ambulatory Visit (INDEPENDENT_AMBULATORY_CARE_PROVIDER_SITE_OTHER): Payer: Medicare HMO | Admitting: Psychology

## 2016-04-18 DIAGNOSIS — R296 Repeated falls: Secondary | ICD-10-CM

## 2016-04-18 DIAGNOSIS — R413 Other amnesia: Secondary | ICD-10-CM

## 2016-04-18 DIAGNOSIS — R4189 Other symptoms and signs involving cognitive functions and awareness: Secondary | ICD-10-CM

## 2016-04-18 DIAGNOSIS — R4689 Other symptoms and signs involving appearance and behavior: Secondary | ICD-10-CM

## 2016-04-18 NOTE — Telephone Encounter (Signed)
Patient's wife wanted to let Antonio [name] know that he has been falling a lot and sometimes 2 to 3 x a week. The Neurosurgeon said not related to back but neurological.

## 2016-04-18 NOTE — Telephone Encounter (Signed)
I understand and agree that back issues don't explain all of the falling.  He isn't done with all of the testing within our office and should make a follow up once that is completed.  Until then, has he done balance therapy?  Using a walker?

## 2016-04-18 NOTE — Telephone Encounter (Signed)
Spoke with patient (wife at work) and he states he is not using a walker- he is using a cane. I advised he should be using a walker- he states he does have a couple and I encouraged him to switch to this device.  He also states he has not done balance therapy but he is willing to do so. He states he can do this at an outpatient facility- he does not need home health.  Appt made for follow up after his appts with Dr. Si Raider and he will call with any other questions/problems prior to this.
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
## 2016-04-19 ENCOUNTER — Telehealth: Payer: Self-pay | Admitting: Neurology

## 2016-04-19 ENCOUNTER — Encounter: Payer: Self-pay | Admitting: Psychology

## 2016-04-19 NOTE — Telephone Encounter (Signed)
Just documenting that Dr. Si Raider was able to elicit that there are 19 dogs in home which increases fall risk (and may explain some of odor noted on my visit)

## 2016-04-21 ENCOUNTER — Ambulatory Visit: Payer: Medicare HMO | Admitting: Podiatry

## 2016-04-24 ENCOUNTER — Encounter: Payer: Self-pay | Admitting: Podiatry

## 2016-04-24 ENCOUNTER — Ambulatory Visit (INDEPENDENT_AMBULATORY_CARE_PROVIDER_SITE_OTHER): Payer: Medicare HMO | Admitting: Podiatry

## 2016-04-24 DIAGNOSIS — B351 Tinea unguium: Secondary | ICD-10-CM

## 2016-04-24 DIAGNOSIS — Z79899 Other long term (current) drug therapy: Secondary | ICD-10-CM

## 2016-04-24 LAB — CBC WITH DIFFERENTIAL/PLATELET
BASOS ABS: 0 {cells}/uL (ref 0–200)
BASOS PCT: 0 %
EOS PCT: 2 %
Eosinophils Absolute: 182 cells/uL (ref 15–500)
HCT: 32.7 % — ABNORMAL LOW (ref 38.5–50.0)
HEMOGLOBIN: 10.3 g/dL — AB (ref 13.2–17.1)
LYMPHS ABS: 1092 {cells}/uL (ref 850–3900)
Lymphocytes Relative: 12 %
MCH: 27.8 pg (ref 27.0–33.0)
MCHC: 31.5 g/dL — ABNORMAL LOW (ref 32.0–36.0)
MCV: 88.4 fL (ref 80.0–100.0)
MONOS PCT: 12 %
MPV: 9.3 fL (ref 7.5–12.5)
Monocytes Absolute: 1092 cells/uL — ABNORMAL HIGH (ref 200–950)
NEUTROS ABS: 6734 {cells}/uL (ref 1500–7800)
Neutrophils Relative %: 74 %
PLATELETS: 308 10*3/uL (ref 140–400)
RBC: 3.7 MIL/uL — ABNORMAL LOW (ref 4.20–5.80)
RDW: 16.2 % — ABNORMAL HIGH (ref 11.0–15.0)
WBC: 9.1 10*3/uL (ref 3.8–10.8)

## 2016-04-24 LAB — HEPATIC FUNCTION PANEL
ALBUMIN: 3.4 g/dL — AB (ref 3.6–5.1)
ALK PHOS: 137 U/L — AB (ref 40–115)
ALT: 20 U/L (ref 9–46)
AST: 18 U/L (ref 10–35)
BILIRUBIN DIRECT: 0.1 mg/dL (ref <=0.2)
BILIRUBIN INDIRECT: 0.2 mg/dL (ref 0.2–1.2)
BILIRUBIN TOTAL: 0.3 mg/dL (ref 0.2–1.2)
Total Protein: 6.3 g/dL (ref 6.1–8.1)

## 2016-04-24 NOTE — Patient Instructions (Signed)

## 2016-04-26 NOTE — Progress Notes (Signed)
Subjective: 76 year old male presents the office they for follow-up evaluation 6 weeks after starting Lamisil. He states that he is doing well and is having no side effects the medication. He has started to notice some improvement of the toenails. Denies any pain to the toenails and denies any redness or drainage. Denies any systemic complaints such as fevers, chills, nausea, vomiting. No acute changes since last appointment, and no other complaints at this time.   Objective: AAO x3, NAD DP/PT pulses palpable bilaterally, CRT less than 3 seconds Nails appear to be hypertrophic, dystrophic, discolored with yellow-brown discoloration. He does appear to be some evidence of clearing on the proximal nail borders however minimal. No signs of infection to the toenails otherwise. No open lesions or pre-ulcerative lesions.  No pain with calf compression, swelling, warmth, erythema  Assessment: Onychomycosis, currently on Lamisil  Plan: -All treatment options discussed with the patient including all alternatives, risks, complications.  -Recommended continuing Lamisil. We will repeat CBC as well as L2 today. Continue to monitor for any side effects the medication. I'll see him back once he finishes a 3 month course of Lamisil. If he is doing well we'll consider doing a fourth month if needed. -Patient encouraged to call the office with any questions, concerns, change in symptoms.   Celesta Gentile, DPM

## 2016-04-29 ENCOUNTER — Encounter: Payer: Self-pay | Admitting: Physical Therapy

## 2016-05-01 ENCOUNTER — Ambulatory Visit: Payer: Medicare HMO | Admitting: Physical Therapy

## 2016-05-01 DIAGNOSIS — M79671 Pain in right foot: Secondary | ICD-10-CM | POA: Diagnosis not present

## 2016-05-01 DIAGNOSIS — M722 Plantar fascial fibromatosis: Secondary | ICD-10-CM | POA: Diagnosis not present

## 2016-05-01 DIAGNOSIS — G8929 Other chronic pain: Secondary | ICD-10-CM | POA: Diagnosis not present

## 2016-05-02 ENCOUNTER — Ambulatory Visit (INDEPENDENT_AMBULATORY_CARE_PROVIDER_SITE_OTHER): Payer: Medicare HMO | Admitting: Podiatry

## 2016-05-02 ENCOUNTER — Encounter: Payer: Self-pay | Admitting: Podiatry

## 2016-05-02 ENCOUNTER — Telehealth: Payer: Self-pay | Admitting: *Deleted

## 2016-05-02 DIAGNOSIS — M722 Plantar fascial fibromatosis: Secondary | ICD-10-CM

## 2016-05-02 DIAGNOSIS — Z79899 Other long term (current) drug therapy: Secondary | ICD-10-CM

## 2016-05-03 LAB — HEPATIC FUNCTION PANEL
ALBUMIN: 3.9 g/dL (ref 3.5–4.8)
ALK PHOS: 151 IU/L — AB (ref 39–117)
ALT: 27 IU/L (ref 0–44)
AST: 23 IU/L (ref 0–40)
BILIRUBIN TOTAL: 0.2 mg/dL (ref 0.0–1.2)
Bilirubin, Direct: 0.1 mg/dL (ref 0.00–0.40)
Total Protein: 6.7 g/dL (ref 6.0–8.5)

## 2016-05-03 NOTE — Progress Notes (Signed)
Subjective: 76 year old male presents the office today for concerns of right heel pain. He does have history plantar fasciitis he feels that this is the same as what he had previously. He did see Dr. Doran Durand yesterday for this and was placed into a boot however he states he was unable to wear this he is asking for steroid injection today as he had this previously and this helped.Denies any recent injury or trauma no numbness or tingling.  He also continues on Lamisil.Denies any side effects the medication.  Denies any systemic complaints such as fevers, chills, nausea, vomiting. No acute changes since last appointment, and no other complaints at this time.   Objective: AAO x3, NAD DP/PT pulses palpable bilaterally, CRT less than 3 seconds There is some mild improvement of the base the toenails with clearing on the proximal nail borders. There is no pain of the toenail is no swelling redness or drainage. Tenderness to palpation along the plantar medial tubercle of the calcaneus at the insertion of plantar fascia on the right foot. There is no pain along the course of the plantar fascia within the arch of the foot. Plantar fascia appears to be intact. There is no pain with lateral compression of the calcaneus or pain with vibratory sensation. There is no pain along the course or insertion of the achilles tendon. No other areas of tenderness to bilateral lower extremities. No open lesions or pre-ulcerative lesions.  No pain with calf compression, swelling, warmth, erythema  Assessment: Right heel pain, plantar fasciitis; on Lamisil for nail fungus.  Plan: -All treatment options discussed with the patient including all alternatives, risks, complications.  -Patient elects to proceed with steroid injection into the right heel. Under sterile skin preparation, a total of 2.5cc of kenalog 10, 0.5% Marcaine plain, and 2% lidocaine plain were infiltrated into the symptomatic area without complication. A  band-aid was applied. Patient tolerated the injection well without complication. Post-injection care with discussed with the patient. Discussed with the patient to ice the area over the next couple of days to help prevent a steroid flare.  -Plantar fascial brace dispnesed -Shoe changes discussed -Stretching exercises. -If symptoms continue will get x-ray next appointment -Will recheck LFT given increase in alk phos level -RTC in 3 weeks or sooner if needed.  Celesta Gentile, DPM

## 2016-05-04 ENCOUNTER — Encounter: Payer: Self-pay | Admitting: Rehabilitation

## 2016-05-04 ENCOUNTER — Ambulatory Visit: Payer: Medicare HMO | Attending: Neurology | Admitting: Rehabilitation

## 2016-05-04 DIAGNOSIS — R2689 Other abnormalities of gait and mobility: Secondary | ICD-10-CM

## 2016-05-04 DIAGNOSIS — R2681 Unsteadiness on feet: Secondary | ICD-10-CM | POA: Insufficient documentation

## 2016-05-04 DIAGNOSIS — M6281 Muscle weakness (generalized): Secondary | ICD-10-CM | POA: Diagnosis present

## 2016-05-04 DIAGNOSIS — M21371 Foot drop, right foot: Secondary | ICD-10-CM | POA: Diagnosis present

## 2016-05-04 MED ORDER — NONFORMULARY OR COMPOUNDED ITEM: 2 refills | Status: DC

## 2016-05-04 NOTE — Patient Instructions (Signed)
Toe / Heel Raise    Gently rock back on heels and raise toes. Then rock forward on toes and raise heels. Repeat sequence __10__ times per session. Do _5-7___ sessions per week.  Copyright  VHI. All rights reserved.

## 2016-05-04 NOTE — Telephone Encounter (Addendum)
-----   Message from Trula Slade, DPM sent at 05/04/2016  8:17 AM EDT ----- Stop the Lamisil., The alkaline phophatase is elevated. Please let him know. Left message informing pt to stop the Lamisil his blood work was abnormal, I explained the use of [place] onychomycosis nail lacquer, delivery, coverage and [phone]. Faxed orders to [facility].

## 2016-05-04 NOTE — Therapy (Signed)
[facility] [address] [place], [place], [address] Phone: [phone]   Fax:  [phone]  Physical Therapy Evaluation  Patient Details  Name: Antonio [name] MRN: [id number] Date of Birth: [birth year] Referring Provider: Alonza Bogus, MD  Encounter Date: 05/04/2016      PT End of Session - 05/04/16 1506    Visit Number 1   Number of Visits 9   Date for PT Re-Evaluation 07/03/16   Authorization Type [employer] MCR-G code on every 10th visit   PT Start Time 1414  pt late to appt   PT Stop Time 1455   PT Time Calculation (min) 41 min   Activity Tolerance Patient tolerated treatment well   Behavior During Therapy [facility] for tasks assessed/performed      Past Medical History:  Diagnosis Date  . Anemia    hx  . 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 Chronic back pain   . Depression   . GERD (gastroesophageal reflux disease)   . Hyperlipidemia    "borderline"    Past Surgical History:  Procedure Laterality Date  . BACK SURGERY    . LUMBAR LAMINECTOMY/DECOMPRESSION MICRODISCECTOMY  09/06/2011   Procedure: LUMBAR LAMINECTOMY/DECOMPRESSION MICRODISCECTOMY 1 LEVEL;  Surgeon: Hosie Spangle, MD;  Location: [place] [facility];  Service: Neurosurgery;  Laterality: Left;  Left lumbar three-four Lumbar Laminotomy/microdiskectomy  . LUMBAR LAMINECTOMY/DECOMPRESSION MICRODISCECTOMY Left 12/26/2012   Procedure: LUMBAR LAMINECTOMY/DECOMPRESSION MICRODISCECTOMY LEFT LUMBAR THREE-FOUR ;  Surgeon: Hosie Spangle, MD;  Location: [place] [facility];  Service: Neurosurgery;  Laterality: Left;  . POLYPECTOMY     removed small intestine  . ROTATOR CUFF REPAIR Left   . TONSILLECTOMY      There were no vitals filed for this visit.       Subjective Assessment - 05/04/16 1416    Subjective Pt reports he presents to PT for "balance, I guess." Reports that he is falling a lot at home.    Limitations House  hold activities;Walking   Currently in Pain? Yes   Pain Score 4    Pain Location Heel   Pain Orientation Right   Pain Descriptors / Indicators Aching   Pain Type Acute pain   Pain Onset 1 to 4 weeks ago   Pain Frequency Intermittent   Aggravating Factors  walking on it   Pain Relieving Factors steriod injection and has a brace for foot that helps            [facility] PT Assessment - 05/04/16 1419      Assessment   Medical Diagnosis Balance, falls   Referring Provider Alonza Bogus, MD   Onset Date/Surgical Date --  notes decline in balance over the last year   Prior Therapy PT for rotator cuff therapy approx 1 year ago     Precautions   Precautions Fall   Required Braces or Orthoses Other Brace/Splint  wears brace for bone spur on R foot     Restrictions   Weight Bearing Restrictions No     Balance Screen   Has the patient fallen in the past 6 months Yes   How many times? 15-20  states many of them are going up stairs in home   Has the patient had a decrease in activity level because of a fear of falling?  No   Is the patient reluctant to leave their home because of a fear of falling?  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   Leisure play with dogs (19 dogs!!), work around the yard     Cognition   Overall Cognitive Status Impaired/Different from baseline   Area of Impairment Memory;Safety/judgement;Awareness    Memory Decreased short-term memory   Safety/Judgement Decreased awareness of deficits   Awareness Emergent   Awareness Comments Pt able to state that he trips over R foot, but has no ability to correct during gait     Sensation   Light Touch Impaired Detail   Light Touch Impaired Details Impaired RUE;Impaired LUE  numbness/tingling in first three fingers on both hands   Hot/Cold Appears Intact   Proprioception Appears Intact     Coordination   Gross Motor Movements are Fluid and Coordinated Yes   Fine Motor Movements are Fluid and Coordinated No     Posture/Postural Control   Posture/Postural Control Postural limitations   Postural Limitations Forward head;Flexed trunk;Posterior pelvic tilt   Posture Comments Pt with shortened trunk on the R     ROM / Strength   AROM / PROM / Strength Strength     Strength   Overall Strength Deficits   Overall Strength Comments overall WFL, however do note some weakness in R hip flex 3+/5 and R ankle DF is 3+/5, otherwise 4/5.  Definitely notice decreased R ankle strength in functional context.      Transfers   Transfers Sit to Stand;Stand to Sit   Sit to Stand 6: Modified independent (Device/Increase time);5: Supervision   Sit to Stand Details Verbal cues for technique   Sit to Stand Details (indicate cue type and reason) Cues for scooting forward prior to standing to avoid use of LEs on back of chair   Five time sit to stand comments  11.16 secs without UE support   Stand to Sit 5: Supervision   Stand to Sit Details (indicate cue type and reason) Verbal cues for technique     Ambulation/Gait   Ambulation/Gait Yes   Ambulation/Gait Assistance 5: Supervision;4: Min guard   Ambulation/Gait Assistance Details Pt ambulatory throughout eval without AD.  Note decreased ability to clear R LE, esp when fatigued or distracted and also when performing stairs.  Upon descending stairs, had mild LOB to the R, was able to self correct.    Ambulation  Distance (Feet) 345 Feet   Assistive device None   Gait Pattern Step-through pattern;Decreased arm swing - right;Decreased arm swing - left;Decreased stance time - right;Decreased hip/knee flexion - right;Decreased dorsiflexion - right;Right foot flat;Trunk flexed;Lateral hip instability;Poor foot clearance - right   Ambulation Surface Level;Indoor   Gait velocity 2.51 ft/sec without AD   Stairs Yes   Stairs Assistance 4: Min guard   Stair Management Technique One rail Right;Alternating pattern;Forwards   Number of Stairs 4   Height of Stairs 6     Standardized Balance Assessment   Standardized Balance  Assessment Dynamic Gait Index     Dynamic Gait Index   Level Surface Mild Impairment   Change in Gait Speed Mild Impairment   Gait with Horizontal Head Turns Moderate Impairment   Gait with Vertical Head Turns Mild Impairment   Gait and Pivot Turn Mild Impairment   Step Over Obstacle Mild Impairment   Step Around Obstacles Mild Impairment   Steps Mild Impairment   Total Score 15   DGI comment: Scores of 19 or less are predictive of falls in older community living adults                           PT Education - 05/04/16 1504    Education provided Yes   Education Details education on evaluation findings, POC, goals, initial ankle strengthening HEP, trialing foot up brace at next session.    Person(s) Educated Patient   Methods Explanation;Demonstration;Handout   Comprehension Verbalized understanding;Returned demonstration          PT Short Term Goals - 05/04/16 1515      PT SHORT TERM GOAL #1   Title Pt will demonstrate understanding of initial HEP in order to indicate improved functional mobility and decreased fall risk.  (Target Date: 06/01/16)   Time 4   Period Weeks   Status New     PT SHORT TERM GOAL #2   Title Pt will improve DGI score to 18/24 in order to indicate decreased fall risk.     Time 4   Period Weeks   Status New     PT SHORT TERM  GOAL #3   Title Will assess 6MWT and improve distance by 150' in order to indicate improved functional endurance.    Time 4   Period Weeks   Status New     PT SHORT TERM GOAL #4   Title Will assess need for foot up brace vs AFO in order to allow adequate R foot clearance and decreased fall risk.     Time 4   Period Weeks   Status New     PT SHORT TERM GOAL #5   Title Pt will ambulate up to 300' over varying indoor surfaces at mod I level (with or without device) in order to indicate safe home negotiation.     Time 4   Period Weeks   Status New     Additional Short Term Goals   Additional Short Term Goals Yes     PT SHORT TERM GOAL #6   Title Pt will negotiate up/down 4 steps with single rail in alternating fashion at mod I level in order to indicate safe negotiation at home.    Time 4   Period Weeks   Status New           PT Long Term Goals - 05/04/16 1518      PT LONG TERM GOAL #1   Title Pt will be independent with HEP in order to indicate improved functional mobility and decreased fall risk.  (Target Date: 06/29/16)   Time 8   Period Weeks   Status New     PT LONG TERM GOAL #2   Title Pt will improve DGI >/=21/24 in order to indicate decreased fall risk.    Time 8   Period Weeks   Status New     PT LONG TERM GOAL #3   Title Pt will improve 6MWT by 150' from baseline in order to indicate improved functional endurance.  Time 8   Period Weeks   Status New     PT LONG TERM GOAL #4   Title Pt will improve gait speed to >/=2.62 ft/sec in order to indicate improved efficiency of gait and ability to ambulate safely in community.     Time 8   Period Weeks   Status New     PT LONG TERM GOAL #5   Title Pt will ambulate up to 500' over grassy surfaces (also demonstrating ability to bend/return to standing) at mod I level in order to indicate safe return to leisure activity/yard work.    Time 8   Period Weeks   Status New               Plan - 05/04/16  1508    Clinical Impression Statement Pt presents with recent history of falls and declining balance over the last year per pt report.  Note per Dr. Doristine Devoid note, he is having memory deficits as well with PISA syndrome being a differential diagnosis.  Pts R LE is weaker than L, which was thought to be from back, however scans were done of lumbar spine and these were clear.  Upon PT evaluation, note that pt is wearing brace on R foot due to recent heel spur and plantar faciitis (and had steriod injection), decreased R foot clearance during gait and LOB, esp with balance challenges, DGI score of 15/24 indicative of increased fall risk and gait speed of 2.51 indicative of limited community ambulation.  Discussed possible need for foot up brace for safety and fall prevention and would trial in next session.  Pt is of evolving presentation and moderate complexity from PT POC standpoint.  Pt will benefit from skilled OP neuro PT in order to address deficits.     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, decreased cognition/awareness   PT Frequency 1x / week   PT Duration 8 weeks   PT Treatment/Interventions ADLs/Self Care Home Management;Electrical Stimulation;DME Instruction;Gait training;Stair training;Functional mobility training;Therapeutic activities;Therapeutic exercise;Balance training;Neuromuscular re-education;Cognitive remediation;Patient/family education;Orthotic Fit/Training;Vestibular;Energy conservation   PT Next Visit Plan add to HEP for BLE strength (ankle and hip), trial foot up brace vs AFO, AD?, balance, backwards gait   PT Home Exercise Plan see initial HEP 05/04/16   Consulted and Agree with Plan of Care Patient      Patient will benefit from skilled therapeutic intervention in order to improve the following deficits and impairments:  Abnormal gait, Decreased activity tolerance,  Decreased balance, Decreased cognition, Decreased endurance, Decreased knowledge of precautions, Decreased safety awareness, Decreased strength, Impaired perceived functional ability, Impaired flexibility, Improper body mechanics  Visit Diagnosis: Unsteadiness on feet - Plan: PT plan of care cert/re-cert  Muscle weakness (generalized) - Plan: PT plan of care cert/re-cert  Other abnormalities of gait and mobility - Plan: PT plan of care cert/re-cert  Foot drop, right - Plan: PT plan of care cert/re-cert      G-Codes - 79/02/40 1523    Functional Assessment Tool Used (Outpatient Only) DGI: 15/24, gait speed 2.51 ft/sec    Functional Limitation Mobility: Walking and moving around   Mobility: Walking and Moving Around Current Status [phone]) At least 20 percent but less than 40 percent impaired, limited or restricted   Mobility: Walking and Moving Around Goal Status ([id number]) At least 1 percent but less than 20 percent impaired, limited or restricted       Problem List  Patient Active Problem List   Diagnosis Date Noted  . Hyperlipidemia   . HNP (herniated nucleus pulposus), lumbar 12/26/2012    Cameron Sprang, PT, MPT [facility] [address] [place], [place], [address] Phone: [phone]   Fax:  [phone] 05/04/16, 3:26 PM  Name: Antonio [name] MRN: [id number] Date of Birth: [birth year]

## 2016-05-08 ENCOUNTER — Ambulatory Visit: Payer: Medicare HMO | Admitting: Rehabilitation

## 2016-05-15 ENCOUNTER — Ambulatory Visit: Payer: Medicare HMO | Admitting: Rehabilitation

## 2016-05-15 ENCOUNTER — Encounter: Payer: Self-pay | Admitting: Rehabilitation

## 2016-05-15 DIAGNOSIS — M6281 Muscle weakness (generalized): Secondary | ICD-10-CM

## 2016-05-15 DIAGNOSIS — M21371 Foot drop, right foot: Secondary | ICD-10-CM

## 2016-05-15 DIAGNOSIS — R2681 Unsteadiness on feet: Secondary | ICD-10-CM | POA: Diagnosis not present

## 2016-05-15 DIAGNOSIS — R2689 Other abnormalities of gait and mobility: Secondary | ICD-10-CM

## 2016-05-15 NOTE — Therapy (Signed)
[facility] [address] [place], [place], [address] Phone: [phone]   Fax:  [phone]  Physical Therapy Treatment  Patient Details  Name: Antonio [name] MRN: [id number] Date of Birth: September 23, 1940 Referring Provider: Alonza Bogus, MD  Encounter Date: 05/15/2016      PT End of Session - 05/15/16 1308    Visit Number 2   Number of Visits 9   Date for PT Re-Evaluation 07/03/16   Authorization Type [employer] MCR-G code on every 10th visit   PT Start Time 1150  pt late to session   PT Stop Time 1231   PT Time Calculation (min) 41 min   Activity Tolerance Patient tolerated treatment well   Behavior During Therapy [facility] for tasks assessed/performed      Past Medical History:  Diagnosis Date  . Anemia    hx  . 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 Chronic back pain   . Depression   . GERD (gastroesophageal reflux disease)   . Hyperlipidemia    "borderline"    Past Surgical History:  Procedure Laterality Date  . BACK SURGERY    . LUMBAR LAMINECTOMY/DECOMPRESSION MICRODISCECTOMY  09/06/2011   Procedure: LUMBAR LAMINECTOMY/DECOMPRESSION MICRODISCECTOMY 1 LEVEL;  Surgeon: Hosie Spangle, MD;  Location: [place] [facility];  Service: Neurosurgery;  Laterality: Left;  Left lumbar three-four Lumbar Laminotomy/microdiskectomy  . LUMBAR LAMINECTOMY/DECOMPRESSION MICRODISCECTOMY Left 12/26/2012   Procedure: LUMBAR LAMINECTOMY/DECOMPRESSION MICRODISCECTOMY LEFT LUMBAR THREE-FOUR ;  Surgeon: Hosie Spangle, MD;  Location: [place] [facility]UR[facility] ORS;  Service: Neurosurgery;  Laterality: Left;  . POLYPECTOMY     removed small intestine  . ROTATOR CUFF REPAIR Left   . TONSILLECTOMY      There were no vitals filed for this visit.      Subjective Assessment - 05/15/16 1151    Subjective Reports he had a fall going up stairs yesterday, wasn't able to pick up foot enough.     Limitations House hold  activities;Walking   Currently in Pain? No/denies            [facility] PT Assessment - 05/15/16 1205      6 Minute Walk- Baseline   6 Minute Walk- Baseline yes   HR (bpm) 73   02 Sat (%RA) 96 %   Modified Borg Scale for Dyspnea 0- Nothing at all   Perceived Rate of Exertion (Borg) 6-     6 Minute walk- Post Test   6 Minute Walk Post Test yes   HR (bpm) 93   02 Sat (%RA) 98 %   Modified Borg Scale for Dyspnea 0- Nothing at all   Perceived Rate of Exertion (Borg) 13- Somewhat hard     6 minute walk test results    Aerobic Endurance Distance Walked 1144                     [facility] Adult PT Treatment/Exercise - 05/15/16 0001      Ambulation/Gait   Ambulation/Gait Yes   Ambulation/Gait Assistance 5: Supervision;4: Min guard   Ambulation/Gait Assistance Details Performed 6MWT see details in assessment.  Also trialed use of foot up brace for RLE.  Performed 6MWT with foot up brace donned.  Note that he tends to speed up near the end of 6MWT and remain on toes causing him to consistently trip over RLE.  Then donned R PLS AFO for another 230' and note marked improvement both with  gait and stairs.  Pt agreeable to this brace and will request order from MD.     Ambulation Distance (Feet) 1144 Feet  in 6MWT and another 400 overall   Assistive device None   Gait Pattern Step-through pattern;Decreased arm swing - right;Decreased arm swing - left;Decreased stance time - right;Decreased hip/knee flexion - right;Decreased dorsiflexion - right;Right foot flat;Trunk flexed;Lateral hip instability;Poor foot clearance - right   Ambulation Surface Level;Indoor   Stairs Yes   Stairs Assistance 5: Supervision   Stairs Assistance Details (indicate cue type and reason) Note that he does well with stairs when he slows down, but he tends to do them fast at home.  Had him try and demonstrate this during session and note that he almost "hops" up the stairs.  Highly recommend he slow down and ensure  that foot placement is adequate before continuing.    Stair Management Technique Two rails;Alternating pattern;Forwards   Number of Stairs 4  x 3 reps   Height of Stairs 6     Exercises   Exercises Other Exercises   Other Exercises  seated hamstring stretch x 2  mins on R side.  Provided as part of HEP.                PT Education - 05/15/16 1308    Education provided Yes   Education Details addition to HEP, ordering brace for R foot drop   Person(s) Educated Patient   Methods Explanation   Comprehension Verbalized understanding          PT Short Term Goals - 05/04/16 1515      PT SHORT TERM GOAL #1   Title Pt will demonstrate understanding of initial HEP in order to indicate improved functional mobility and decreased fall risk.  (Target Date: 06/01/16)   Time 4   Period Weeks   Status New     PT SHORT TERM GOAL #2   Title Pt will improve DGI score to 18/24 in order to indicate decreased fall risk.     Time 4   Period Weeks   Status New     PT SHORT TERM GOAL #3   Title Will assess 6MWT and improve distance by 150' in order to indicate improved functional endurance.    Time 4   Period Weeks   Status New     PT SHORT TERM GOAL #4   Title Will assess need for foot up brace vs AFO in order to allow adequate R foot clearance and decreased fall risk.     Time 4   Period Weeks   Status New     PT SHORT TERM GOAL #5   Title Pt will ambulate up to 300' over varying indoor surfaces at mod I level (with or without device) in order to indicate safe home negotiation.     Time 4   Period Weeks   Status New     Additional Short Term Goals   Additional Short Term Goals Yes     PT SHORT TERM GOAL #6   Title Pt will negotiate up/down 4 steps with single rail in alternating fashion at mod I level in order to indicate safe negotiation at home.    Time 4   Period Weeks   Status New           PT Long Term Goals - 05/04/16 1518      PT LONG TERM GOAL #1    Title Pt will be independent with HEP  in order to indicate improved functional mobility and decreased fall risk.  (Target Date: 06/29/16)   Time 8   Period Weeks   Status New     PT LONG TERM GOAL #2   Title Pt will improve DGI >/=21/24 in order to indicate decreased fall risk.    Time 8   Period Weeks   Status New     PT LONG TERM GOAL #3   Title Pt will improve 6MWT by 150' from baseline in order to indicate improved functional endurance.    Time 8   Period Weeks   Status New     PT LONG TERM GOAL #4   Title Pt will improve gait speed to >/=2.62 ft/sec in order to indicate improved efficiency of gait and ability to ambulate safely in community.     Time 8   Period Weeks   Status New     PT LONG TERM GOAL #5   Title Pt will ambulate up to 500' over grassy surfaces (also demonstrating ability to bend/return to standing) at mod I level in order to indicate safe return to leisure activity/yard work.    Time 8   Period Weeks   Status New               Plan - 05/15/16 1310    Clinical Impression Statement Skilled session focused on assessment of functional endurance with 6MWT.  Note distance of 1144', which is below normal for his age group (approx 1700').  However discussed that we will need to address strength and balance deficits due to his R foot drag.  Also trialed foot up brace vs PLS AFO.  Note PLS AFO demonstrates marked improvement in foot clearance and safety.  Pt agreeable to this brace therefore will request order from MD.     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, decreased cognition/awareness   PT Frequency 1x / week   PT Duration 8 weeks   PT Treatment/Interventions ADLs/Self Care Home Management;Electrical Stimulation;DME Instruction;Gait training;Stair training;Functional mobility training;Therapeutic activities;Therapeutic exercise;Balance  training;Neuromuscular re-education;Cognitive remediation;Patient/family education;Orthotic Fit/Training;Vestibular;Energy conservation   PT Next Visit Plan Add hip and ankle strength to HEP, see if AFO order is in EPIC   PT Home Exercise Plan see initial HEP 05/04/16   Consulted and Agree with Plan of Care Patient      Patient will benefit from skilled therapeutic intervention in order to improve the following deficits and impairments:  Abnormal gait, Decreased activity tolerance, Decreased balance, Decreased cognition, Decreased endurance, Decreased knowledge of precautions, Decreased safety awareness, Decreased strength, Impaired perceived functional ability, Impaired flexibility, Improper body mechanics  Visit Diagnosis: Unsteadiness on feet  Muscle weakness (generalized)  Other abnormalities of gait and mobility  Foot drop, right     Problem List Patient Active Problem List   Diagnosis Date Noted  . Hyperlipidemia   . HNP (herniated nucleus pulposus), lumbar 12/26/2012    Cameron Sprang, PT, MPT [facility] [address] [place], [place], [address] Phone: [phone]   Fax:  [phone] 05/15/16, 1:15 PM  Name: Antonio [name] MRN: [id number] Date of Birth: 1940-05-03

## 2016-05-15 NOTE — Patient Instructions (Addendum)
Toe / Heel Raise    Gently rock back on heels and raise toes. Then rock forward on toes and raise heels. Repeat sequence __10__ times per session. Do _5-7___ sessions per week.  Copyright  VHI. All rights reserved.   HIP: Hamstrings - Short Sitting    Rest leg on raised surface.  You do not have to put your foot on a step, just scoot to the edge of the chair to make sure leg is straight. Keep knee straight. Lift chest. Hold _60__ seconds. __2-3_ reps per set, _2__ sets per day, _5-7__ days per week  Copyright  VHI. All rights reserved.

## 2016-05-18 ENCOUNTER — Encounter: Payer: Self-pay | Admitting: Neurology

## 2016-05-22 ENCOUNTER — Encounter: Payer: Self-pay | Admitting: Neurology

## 2016-05-22 DIAGNOSIS — R251 Tremor, unspecified: Secondary | ICD-10-CM

## 2016-05-24 ENCOUNTER — Ambulatory Visit: Payer: Medicare HMO | Attending: Neurology | Admitting: Physical Therapy

## 2016-05-24 DIAGNOSIS — R29898 Other symptoms and signs involving the musculoskeletal system: Secondary | ICD-10-CM | POA: Insufficient documentation

## 2016-05-24 DIAGNOSIS — M6281 Muscle weakness (generalized): Secondary | ICD-10-CM | POA: Insufficient documentation

## 2016-05-24 DIAGNOSIS — M21371 Foot drop, right foot: Secondary | ICD-10-CM | POA: Insufficient documentation

## 2016-05-24 DIAGNOSIS — R2681 Unsteadiness on feet: Secondary | ICD-10-CM | POA: Insufficient documentation

## 2016-05-24 DIAGNOSIS — R2689 Other abnormalities of gait and mobility: Secondary | ICD-10-CM | POA: Insufficient documentation

## 2016-05-25 ENCOUNTER — Ambulatory Visit (INDEPENDENT_AMBULATORY_CARE_PROVIDER_SITE_OTHER): Payer: Medicare HMO | Admitting: Psychology

## 2016-05-25 DIAGNOSIS — R413 Other amnesia: Secondary | ICD-10-CM

## 2016-05-25 NOTE — Progress Notes (Signed)
   Neuropsychology Note  Antonio [name] returned today for 2 hours of neuropsychological testing with technician, Milana Kidney, BS, under the supervision of Dr. Macarthur Critchley. The patient did not appear overtly distressed by the testing session, per behavioral observation or via self-report to the technician. Rest breaks were offered. Antonio [name] will return within 2 weeks for a feedback session with Dr. Si Raider at which time his test performances, clinical impressions and treatment recommendations will be reviewed in detail. The patient understands he can contact our office should he require our assistance before this time.  Full report to follow.

## 2016-05-26 ENCOUNTER — Telehealth: Payer: Self-pay | Admitting: Neurology

## 2016-05-26 DIAGNOSIS — S63591A Other specified sprain of right wrist, initial encounter: Secondary | ICD-10-CM | POA: Diagnosis not present

## 2016-05-26 DIAGNOSIS — E559 Vitamin D deficiency, unspecified: Secondary | ICD-10-CM | POA: Diagnosis not present

## 2016-05-26 DIAGNOSIS — S6991XA Unspecified injury of right wrist, hand and finger(s), initial encounter: Secondary | ICD-10-CM | POA: Diagnosis not present

## 2016-05-26 DIAGNOSIS — R202 Paresthesia of skin: Secondary | ICD-10-CM | POA: Diagnosis not present

## 2016-05-26 DIAGNOSIS — L03119 Cellulitis of unspecified part of limb: Secondary | ICD-10-CM | POA: Diagnosis not present

## 2016-05-26 DIAGNOSIS — W19XXXA Unspecified fall, initial encounter: Secondary | ICD-10-CM | POA: Diagnosis not present

## 2016-05-26 DIAGNOSIS — S63501A Unspecified sprain of right wrist, initial encounter: Secondary | ICD-10-CM | POA: Diagnosis not present

## 2016-05-26 DIAGNOSIS — R2681 Unsteadiness on feet: Secondary | ICD-10-CM | POA: Diagnosis not present

## 2016-05-26 DIAGNOSIS — M25531 Pain in right wrist: Secondary | ICD-10-CM | POA: Diagnosis not present

## 2016-05-26 DIAGNOSIS — R200 Anesthesia of skin: Secondary | ICD-10-CM | POA: Diagnosis not present

## 2016-05-26 NOTE — Telephone Encounter (Signed)
Caller: Sydell Axon with [facility]  Urgent? No  Reason for the call: She said she got one MRI but needs the MRI of the brain [phone]

## 2016-05-26 NOTE — Telephone Encounter (Signed)
MR Brain sent to Dr. Fara Olden.

## 2016-05-29 ENCOUNTER — Ambulatory Visit: Payer: Medicare HMO | Admitting: Rehabilitation

## 2016-05-29 ENCOUNTER — Telehealth: Payer: Self-pay | Admitting: Rehabilitation

## 2016-05-29 ENCOUNTER — Encounter: Payer: Self-pay | Admitting: Rehabilitation

## 2016-05-29 DIAGNOSIS — M6281 Muscle weakness (generalized): Secondary | ICD-10-CM | POA: Diagnosis not present

## 2016-05-29 DIAGNOSIS — R269 Unspecified abnormalities of gait and mobility: Secondary | ICD-10-CM

## 2016-05-29 DIAGNOSIS — M21371 Foot drop, right foot: Secondary | ICD-10-CM

## 2016-05-29 DIAGNOSIS — R2689 Other abnormalities of gait and mobility: Secondary | ICD-10-CM

## 2016-05-29 DIAGNOSIS — R2681 Unsteadiness on feet: Secondary | ICD-10-CM | POA: Diagnosis not present

## 2016-05-29 DIAGNOSIS — R29898 Other symptoms and signs involving the musculoskeletal system: Secondary | ICD-10-CM

## 2016-05-29 MED ORDER — AMBULATORY NON FORMULARY MEDICATION: 0 refills | Status: DC

## 2016-05-29 NOTE — Patient Instructions (Addendum)
Toe / Heel Raise    Gently rock back on heels and raise toes. Then rock forward on toes and raise heels. Repeat sequence __10__ times per session. Do _5-7___ sessions per week.  Copyright  VHI. All rights reserved.  HIP: Hamstrings - Short Sitting    Rest leg on raised surface.  You do not have to put your foot on a step, just scoot to the edge of the chair to make sure leg is straight. Keep knee straight. Lift chest. Hold _60__ seconds. __2-3_ reps per set, _2__ sets per day, _5-7__ days per week  Copyright  VHI. All rights reserved.   Tandem Walking    Walk with each foot directly in front of other, heel of one foot touching toes of other foot with each step. Both feet straight ahead.  Do this beside a counter top.   Walk forward heel to toe and then backwards heel to toe slowly.  Use your hand for support but as you progress decrease the amount of support you use.  Repeat x 3 laps down and back.  Do 1-2 times per day.     Copyright  VHI. All rights reserved.    "I love a Parade" Lift   March along counter top forwards and then backwards when you get to the end.  Make sure you go slow and raise knees as high as you can.  Use support as needed, but as you progress try to decrease amount of support.   Repeat ___3_ times. Do _1-2___ sessions per day.  http://gt2.exer.us/344   Copyright  VHI. All rights reserved.     Do these standing in corner with chair in front of you:   Feet Apart (Compliant Surface) Varied Arm Positions - Eyes Closed    Stand on compliant surface: ___pillow or cushion _____ with feet shoulder width apart and arms out. Close eyes and visualize upright position. Hold__30__ seconds. Repeat _3___ times per session. Do __1-2__ sessions per day.  Copyright  VHI. All rights reserved.   Feet Together (Compliant Surface) Head Motion - Eyes Open    With eyes open, standing on compliant surface: __pillow or cushion______, feet together, move head  slowly: up and down x 10 reps, side to side x 10 reps.  Repeat _1___ times per session. Do __1-2__ sessions per day.  Copyright  VHI. All rights reserved.

## 2016-05-29 NOTE — Telephone Encounter (Signed)
Dr. Carles Collet,   I have been seeing Mr. Antonio [name] at [facility] for PT.  Note we have trialed use of PLS AFO for RLE and this seems to provide a great deal of support and decreased fall risk for this patient.  He is agreeable to wear AFO.  If you agree, please write order in EPIC for RLE AFO with diagnosis of gait abnormality with R LE weakness and foot drop.    Thanks,  Cameron Sprang, PT, MPT [facility] [address] [place], [place], [address] Phone: [phone]   Fax:  [phone] 05/29/16, 1:46 PM

## 2016-05-29 NOTE — Therapy (Signed)
[facility] [address] [place], [place], [address] Phone: [phone]   Fax:  [phone]  Physical Therapy Treatment  Patient Details  Name: Antonio [name] MRN: [id number] Date of Birth: 17-Oct-1940 Referring Provider: Kerin Salen, MD  Encounter Date: 05/29/2016      PT End of Session - 05/29/16 1338    Visit Number 3   Number of Visits 9   Date for PT Re-Evaluation 07/03/16   Authorization Type [employer] MCR-G code on every 10th visit   PT Start Time 1102   PT Stop Time 1146   PT Time Calculation (min) 44 min   Activity Tolerance Patient tolerated treatment well   Behavior During Therapy [facility] for tasks assessed/performed      Past Medical History:  Diagnosis Date  . Anemia    hx  . 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 Chronic back pain   . Depression   . GERD (gastroesophageal reflux disease)   . Hyperlipidemia    "borderline"    Past Surgical History:  Procedure Laterality Date  . BACK SURGERY    . LUMBAR LAMINECTOMY/DECOMPRESSION MICRODISCECTOMY  09/06/2011   Procedure: LUMBAR LAMINECTOMY/DECOMPRESSION MICRODISCECTOMY 1 LEVEL;  Surgeon: Hewitt Shorts, MD;  Location: [facility];  Service: Neurosurgery;  Laterality: Left;  Left lumbar three-four Lumbar Laminotomy/microdiskectomy  . LUMBAR LAMINECTOMY/DECOMPRESSION MICRODISCECTOMY Left 12/26/2012   Procedure: LUMBAR LAMINECTOMY/DECOMPRESSION MICRODISCECTOMY LEFT LUMBAR THREE-FOUR ;  Surgeon: Hewitt Shorts, MD;  Location: [facility];  Service: Neurosurgery;  Laterality: Left;  . POLYPECTOMY     removed small intestine  . ROTATOR CUFF REPAIR Left   . TONSILLECTOMY      There were no vitals filed for this visit.      Subjective Assessment - 05/29/16 1106    Subjective Reports had a fall in restaurant in which he was using his cane and fell over it.     Limitations House hold activities;Walking   Currently in Pain? No/denies                         [facility] Adult PT Treatment/Exercise - 05/29/16 0001      Ambulation/Gait   Ambulation/Gait Yes   Ambulation/Gait Assistance 5: Supervision;4: Min guard   Ambulation/Gait Assistance Details Continue to perform gait with R PLS AFO during session.  Continue to note marked improvement in foot clearance, however does require cues for slower gait speed, esp around turns.  Also had pt use quad tip cane (this is what he was using at home).  Requires cues for safe use of cane and had him trial use in R and L hand.  Both looked about the same, however feel that he was able to get pattern easier in R hand vs L.   Note at end of session following balance exercises, he reports pain at met head of R big toe, therefore placed AFO under shoe insert as well as extra insert to provide more support to pts foot.  He reports this does help slightly.     Ambulation Distance (Feet) 500 Feet   Assistive device --  quad tip cane   Gait Pattern Step-through pattern;Decreased arm swing - right;Decreased arm swing - left;Decreased stance time - right;Decreased hip/knee flexion - right;Decreased dorsiflexion - right;Right foot flat;Trunk flexed;Lateral hip instability;Poor foot clearance - right   Ambulation Surface Level;Indoor     Self-Care   Self-Care  Other Self-Care Comments   Other Self-Care Comments  Discussed safety with use of cane and continuing to practice in therapy to ensure proper placement.  Also discussed slowing his gait patter, esp with fatigue as this is when he tends to speed up and body gets in front of LEs.  Pt verbalized understanding.      Neuro Re-ed    Neuro Re-ed Details  Provided pt with several exercises for balance during session both at counter top and corner.  see pt instruction for details on exercises performed and number of reps.  Tolerated well.                  PT Education - 05/29/16 1337    Education  provided Yes   Education Details see self care   Person(s) Educated Patient   Methods Explanation   Comprehension Verbalized understanding          PT Short Term Goals - 05/04/16 1515      PT SHORT TERM GOAL #1   Title Pt will demonstrate understanding of initial HEP in order to indicate improved functional mobility and decreased fall risk.  (Target Date: 06/01/16)   Time 4   Period Weeks   Status New     PT SHORT TERM GOAL #2   Title Pt will improve DGI score to 18/24 in order to indicate decreased fall risk.     Time 4   Period Weeks   Status New     PT SHORT TERM GOAL #3   Title Will assess 6MWT and improve distance by 150' in order to indicate improved functional endurance.    Time 4   Period Weeks   Status New     PT SHORT TERM GOAL #4   Title Will assess need for foot up brace vs AFO in order to allow adequate R foot clearance and decreased fall risk.     Time 4   Period Weeks   Status New     PT SHORT TERM GOAL #5   Title Pt will ambulate up to 300' over varying indoor surfaces at mod I level (with or without device) in order to indicate safe home negotiation.     Time 4   Period Weeks   Status New     Additional Short Term Goals   Additional Short Term Goals Yes     PT SHORT TERM GOAL #6   Title Pt will negotiate up/down 4 steps with single rail in alternating fashion at mod I level in order to indicate safe negotiation at home.    Time 4   Period Weeks   Status New           PT Long Term Goals - 05/04/16 1518      PT LONG TERM GOAL #1   Title Pt will be independent with HEP in order to indicate improved functional mobility and decreased fall risk.  (Target Date: 06/29/16)   Time 8   Period Weeks   Status New     PT LONG TERM GOAL #2   Title Pt will improve DGI >/=21/24 in order to indicate decreased fall risk.    Time 8   Period Weeks   Status New     PT LONG TERM GOAL #3   Title Pt will improve 6MWT by 150' from baseline in order to  indicate improved functional endurance.    Time 8   Period Weeks   Status New  PT LONG TERM GOAL #4   Title Pt will improve gait speed to >/=2.62 ft/sec in order to indicate improved efficiency of gait and ability to ambulate safely in community.     Time 8   Period Weeks   Status New     PT LONG TERM GOAL #5   Title Pt will ambulate up to 500' over grassy surfaces (also demonstrating ability to bend/return to standing) at mod I level in order to indicate safe return to leisure activity/yard work.    Time 8   Period Weeks   Status New               Plan - 05/29/16 1338    Clinical Impression Statement Skilled session focused on gait with PLS AFO to continue to assess appropriateness/improved safety.  Pt continues to demonstrate improved foot clearance with AFO and therefore will ask Dr. Carles Collet for order.  Pt in agreement.  Also provided demonstration and education on proper use of cane.  tolerated all well.     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, decreased cognition/awareness   PT Frequency 1x / week   PT Duration 8 weeks   PT Treatment/Interventions ADLs/Self Care Home Management;Electrical Stimulation;DME Instruction;Gait training;Stair training;Functional mobility training;Therapeutic activities;Therapeutic exercise;Balance training;Neuromuscular re-education;Cognitive remediation;Patient/family education;Orthotic Fit/Training;Vestibular;Energy conservation   PT Next Visit Plan check compliance with HEP, see if AFO in epic   PT Home Exercise Plan see initial HEP 05/04/16   Consulted and Agree with Plan of Care Patient      Patient will benefit from skilled therapeutic intervention in order to improve the following deficits and impairments:  Abnormal gait, Decreased activity tolerance, Decreased balance, Decreased cognition, Decreased endurance, Decreased  knowledge of precautions, Decreased safety awareness, Decreased strength, Impaired perceived functional ability, Impaired flexibility, Improper body mechanics  Visit Diagnosis: Unsteadiness on feet  Muscle weakness (generalized)  Other abnormalities of gait and mobility     Problem List Patient Active Problem List   Diagnosis Date Noted  . Hyperlipidemia   . HNP (herniated nucleus pulposus), lumbar 12/26/2012    Cameron Sprang, PT, MPT [facility] [address] [place], [place], [address] Phone: [phone]   Fax:  [phone] 05/29/16, 1:41 PM  Name: NITISH ROES MRN: [id number] Date of Birth: Jan 25, 1940

## 2016-05-29 NOTE — Telephone Encounter (Signed)
Will do. Thank you

## 2016-05-29 NOTE — Telephone Encounter (Signed)
Order entered

## 2016-05-30 DIAGNOSIS — Z1283 Encounter for screening for malignant neoplasm of skin: Secondary | ICD-10-CM | POA: Diagnosis not present

## 2016-05-30 DIAGNOSIS — L570 Actinic keratosis: Secondary | ICD-10-CM | POA: Diagnosis not present

## 2016-05-30 DIAGNOSIS — I872 Venous insufficiency (chronic) (peripheral): Secondary | ICD-10-CM | POA: Diagnosis not present

## 2016-05-30 DIAGNOSIS — D225 Melanocytic nevi of trunk: Secondary | ICD-10-CM | POA: Diagnosis not present

## 2016-05-30 DIAGNOSIS — X32XXXD Exposure to sunlight, subsequent encounter: Secondary | ICD-10-CM | POA: Diagnosis not present

## 2016-06-05 ENCOUNTER — Ambulatory Visit: Payer: Medicare HMO | Admitting: Rehabilitation

## 2016-06-05 ENCOUNTER — Ambulatory Visit (INDEPENDENT_AMBULATORY_CARE_PROVIDER_SITE_OTHER): Payer: Medicare HMO | Admitting: Podiatry

## 2016-06-05 ENCOUNTER — Encounter: Payer: Self-pay | Admitting: Podiatry

## 2016-06-05 ENCOUNTER — Encounter: Payer: Self-pay | Admitting: Rehabilitation

## 2016-06-05 ENCOUNTER — Telehealth: Payer: Self-pay | Admitting: Rehabilitation

## 2016-06-05 DIAGNOSIS — R2681 Unsteadiness on feet: Secondary | ICD-10-CM | POA: Diagnosis not present

## 2016-06-05 DIAGNOSIS — B351 Tinea unguium: Secondary | ICD-10-CM | POA: Diagnosis not present

## 2016-06-05 DIAGNOSIS — R29898 Other symptoms and signs involving the musculoskeletal system: Secondary | ICD-10-CM | POA: Diagnosis not present

## 2016-06-05 DIAGNOSIS — M6281 Muscle weakness (generalized): Secondary | ICD-10-CM

## 2016-06-05 DIAGNOSIS — M722 Plantar fascial fibromatosis: Secondary | ICD-10-CM | POA: Diagnosis not present

## 2016-06-05 DIAGNOSIS — R269 Unspecified abnormalities of gait and mobility: Secondary | ICD-10-CM

## 2016-06-05 DIAGNOSIS — M21371 Foot drop, right foot: Secondary | ICD-10-CM | POA: Diagnosis not present

## 2016-06-05 DIAGNOSIS — R2689 Other abnormalities of gait and mobility: Secondary | ICD-10-CM | POA: Diagnosis not present

## 2016-06-05 DIAGNOSIS — Z79899 Other long term (current) drug therapy: Secondary | ICD-10-CM | POA: Diagnosis not present

## 2016-06-05 NOTE — Therapy (Signed)
[facility] [address] [place], [place], [address] Phone: [phone]   Fax:  [phone]  Physical Therapy Treatment  Patient Details  Name: Antonio [name] MRN: [id number] Date of Birth: [birth year] Referring Provider: Alonza Bogus, MD  Encounter Date: 06/05/2016      PT End of Session - 06/05/16 1243    Visit Number 4   Number of Visits 9   Date for PT Re-Evaluation 07/03/16   Authorization Type [employer] MCR-G code on every 10th visit   PT Start Time 1030   PT Stop Time 1116   PT Time Calculation (min) 46 min   Activity Tolerance Patient tolerated treatment well   Behavior During Therapy [facility] for tasks assessed/performed      Past Medical History:  Diagnosis Date  . Anemia    hx  . 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 Chronic back pain   . Depression   . GERD (gastroesophageal reflux disease)   . Hyperlipidemia    "borderline"    Past Surgical History:  Procedure Laterality Date  . BACK SURGERY    . LUMBAR LAMINECTOMY/DECOMPRESSION MICRODISCECTOMY  09/06/2011   Procedure: LUMBAR LAMINECTOMY/DECOMPRESSION MICRODISCECTOMY 1 LEVEL;  Surgeon: Hosie Spangle, MD;  Location: [place] [facility];  Service: Neurosurgery;  Laterality: Left;  Left lumbar three-four Lumbar Laminotomy/microdiskectomy  . LUMBAR LAMINECTOMY/DECOMPRESSION MICRODISCECTOMY Left 12/26/2012   Procedure: LUMBAR LAMINECTOMY/DECOMPRESSION MICRODISCECTOMY LEFT LUMBAR THREE-FOUR ;  Surgeon: Hosie Spangle, MD;  Location: [place] [facility];  Service: Neurosurgery;  Laterality: Left;  . POLYPECTOMY     removed small intestine  . ROTATOR CUFF REPAIR Left   . TONSILLECTOMY      There were no vitals filed for this visit.      Subjective Assessment - 06/05/16 1032    Subjective Reports no changes, no falls since last visit.    Limitations House hold activities;Walking   Currently in Pain? No/denies             [facility] PT Assessment - 06/05/16 1049      6 Minute Walk- Baseline   6 Minute Walk- Baseline yes   HR (bpm) 78   02 Sat (%RA) 97 %   Modified Borg Scale for Dyspnea 0- Nothing at all   Perceived Rate of Exertion (Borg) 6-     6 Minute walk- Post Test   6 Minute Walk Post Test yes   HR (bpm) 90   02 Sat (%RA) 97 %   Modified Borg Scale for Dyspnea 1- Very mild shortness of breath   Perceived Rate of Exertion (Borg) 13- Somewhat hard     6 minute walk test results    Aerobic Endurance Distance Walked 1049   Endurance additional comments No LOB, performed with use of PLS AFO.                      [place] Adult PT Treatment/Exercise - 06/05/16 1049      Ambulation/Gait   Ambulation/Gait Yes   Ambulation/Gait Assistance 6: Modified independent (Device/Increase time);5: Supervision   Ambulation/Gait Assistance Details Performed 6MWT with use of R PLS AFO.  Note marked improvement in clearance during testing with no LOB or evidence of toe drag as in the previous testing.      Ambulation Distance (Feet) 1049 Feet   Assistive device None  AFO    Gait Pattern Step-through pattern;Decreased arm swing - right;Decreased arm swing -  left;Decreased stance time - right;Decreased hip/knee flexion - right;Decreased dorsiflexion - right;Right foot flat;Trunk flexed;Lateral hip instability;Poor foot clearance - right   Ambulation Surface Level;Indoor   Stairs Yes   Stairs Assistance 6: Modified independent (Device/Increase time)   Stair Management Technique One rail Right;Alternating pattern;Forwards   Number of Stairs 4   Height of Stairs 6     Standardized Balance Assessment   Standardized Balance Assessment Dynamic Gait Index     Dynamic Gait Index   Level Surface Normal   Change in Gait Speed Mild Impairment   Gait with Horizontal Head Turns Mild Impairment   Gait with Vertical Head Turns Mild Impairment   Gait and Pivot Turn Mild Impairment   Step Over Obstacle  Normal   Step Around Obstacles Mild Impairment   Steps Mild Impairment   Total Score 18     Self-Care   Self-Care Other Self-Care Comments   Other Self-Care Comments  Discussed walking program vs using reccumbant bike at home to improve endurance.  Provided handout, along with exercises again for improved carryover with exercises and walking program.       Neuro Re-ed    Neuro Re-ed Details  Went over current HEP for balance, see pt instruction for details on exercises and reps performed.                  PT Education - 06/05/16 1243    Education provided Yes   Education Details see self care   Person(s) Educated Patient   Methods Explanation   Comprehension Verbalized understanding          PT Short Term Goals - 06/05/16 1033      PT SHORT TERM GOAL #1   Title Pt will demonstrate understanding of initial HEP in order to indicate improved functional mobility and decreased fall risk.  (Target Date: 06/01/16)   Baseline Pt not consistently performing all exercises, needs many cues to complete correctly.    Time 4   Period Weeks   Status Partially Met     PT SHORT TERM GOAL #2   Title Pt will improve DGI score to 18/24 in order to indicate decreased fall risk.     Baseline 18/24 on 06/05/16   Time 4   Period Weeks   Status Achieved     PT SHORT TERM GOAL #3   Title Will assess 6MWT and improve distance by 150' in order to indicate improved functional endurance.    Baseline 1144' on eval to 1049' on 06/05/16   Time 4   Period Weeks   Status Not Met     PT SHORT TERM GOAL #4   Title Will assess need for foot up brace vs AFO in order to allow adequate R foot clearance and decreased fall risk.     Baseline met    Time 4   Period Weeks   Status Achieved     PT SHORT TERM GOAL #5   Title Pt will ambulate up to 300' over varying indoor surfaces at mod I level (with or without device) in order to indicate safe home negotiation.     Time 4   Period Weeks   Status  New     PT SHORT TERM GOAL #6   Title Pt will negotiate up/down 4 steps with single rail in alternating fashion at mod I level in order to indicate safe negotiation at home.    Baseline met 06/05/16   Time 4   Period [place]  Status Achieved           PT Long Term Goals - 05/04/16 1518      PT LONG TERM GOAL #1   Title Pt will be independent with HEP in order to indicate improved functional mobility and decreased fall risk.  (Target Date: 06/29/16)   Time 8   Period Weeks   Status New     PT LONG TERM GOAL #2   Title Pt will improve DGI >/=21/24 in order to indicate decreased fall risk.    Time 8   Period Weeks   Status New     PT LONG TERM GOAL #3   Title Pt will improve 6MWT by 150' from baseline in order to indicate improved functional endurance.    Time 8   Period Weeks   Status New     PT LONG TERM GOAL #4   Title Pt will improve gait speed to >/=2.62 ft/sec in order to indicate improved efficiency of gait and ability to ambulate safely in community.     Time 8   Period Weeks   Status New     PT LONG TERM GOAL #5   Title Pt will ambulate up to 500' over grassy surfaces (also demonstrating ability to bend/return to standing) at mod I level in order to indicate safe return to leisure activity/yard work.    Time 8   Period Weeks   Status New               Plan - 06/05/16 1243    Clinical Impression Statement Skilled session to begin to address STGs.  Note he has not met 6MWT goal and actually has showed decline in endurance/distance, but was safer overall with use of PLS AFO (working to get order and have [employer] come to session).  He also is not very consistent with HEP and therefore went over again today for improved carryover.  He has me stair goal and demonstrates improved safety with this.  Will continue to address STGs during next session.    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, decreased cognition/awareness   PT Frequency 1x / week   PT Duration 8 weeks   PT Treatment/Interventions ADLs/Self Care Home Management;Electrical Stimulation;DME Instruction;Gait training;Stair training;Functional mobility training;Therapeutic activities;Therapeutic exercise;Balance training;Neuromuscular re-education;Cognitive remediation;Patient/family education;Orthotic Fit/Training;Vestibular;Energy conservation   PT Next Visit Plan check compliance with HEP, remaining STGs, balance, stepping tasks for improved stride length, see if AFO in epic   PT Home Exercise Plan see initial HEP 05/04/16   Consulted and Agree with Plan of Care Patient      Patient will benefit from skilled therapeutic intervention in order to improve the following deficits and impairments:  Abnormal gait, Decreased activity tolerance, Decreased balance, Decreased cognition, Decreased endurance, Decreased knowledge of precautions, Decreased safety awareness, Decreased strength, Impaired perceived functional ability, Impaired flexibility, Improper body mechanics  Visit Diagnosis: Weakness of right lower extremity  Foot drop, right  Unsteadiness on feet  Muscle weakness (generalized)  Other abnormalities of gait and mobility     Problem List Patient Active Problem List   Diagnosis Date Noted  . Hyperlipidemia   . HNP (herniated nucleus pulposus), lumbar 12/26/2012    Cameron Sprang, PT, MPT [facility] [address] [place], [place], [address] Phone: [phone]   Fax:  [phone] 06/05/16, 12:49 PM  Name: Antonio [name] MRN: [id number] Date of  Birth: April 26, 1940

## 2016-06-05 NOTE — Patient Instructions (Addendum)
Toe / Heel Raise    Gently rock back on heels and raise toes. Then rock forward on toes and raise heels. Repeat sequence __10__ times per session. Do _5-7___ sessions per week.  Copyright  VHI. All rights reserved.  HIP: Hamstrings - Short Sitting    Rest leg on raised surface. You do not have to put your foot on a step, just scoot to the edge of the chair to make sure leg is straight. Keep knee straight. Lift chest. Hold _60__ seconds. __2-3_ reps per set, _2__ sets per day, _5-7__ days per week  Copyright  VHI. All rights reserved.  Tandem Walking    Walk with each foot directly in front of other, heel of one foot touching toes of other foot with each step. Both feet straight ahead.  Do this beside a counter top.   Walk forward heel to toe and then backwards heel to toe slowly.  Use your hand for support but as you progress decrease the amount of support you use.  Repeat x 3 laps down and back.  Do 1-2 times per day.     Copyright  VHI. All rights reserved.    "I love a Parade" Lift   March along counter top forwards and then backwards when you get to the end.  Make sure you go slow and raise knees as high as you can.  Use support as needed, but as you progress try to decrease amount of support.   Repeat ___3_ times. Do _1-2___ sessions per day.  http://gt2.exer.us/344   Copyright  VHI. All rights reserved.     Do these standing in corner with chair in front of you:   Feet Apart (Compliant Surface) Varied Arm Positions - Eyes Closed    Stand on compliant surface: ___pillow or cushion _____ with feet shoulder width apart and arms out. Close eyes and visualize upright position. Hold__30__ seconds. Repeat _3___ times per session. Do __1-2__ sessions per day.  Copyright  VHI. All rights reserved.   Feet Together (Compliant Surface) Head Motion - Eyes Open    With eyes open, standing on compliant surface: __pillow or cushion______, feet together,  move head slowly: up and down x 10 reps, side to side x 10 reps.  Repeat _1___ times per session. Do __1-2__ sessions per day.  Copyright  VHI. All rights reserved.    WALKING  Walking is a great form of exercise to increase your strength, endurance and overall fitness.  A walking program can help you start slowly and gradually build endurance as you go.  Everyone's ability is different, so each person's starting point will be different.  You do not have to follow them exactly.  The are just samples. You should simply find out what's right for you and stick to that program.   In the beginning, you'll start off walking 2-3 times a day for short distances.  As you get stronger, you'll be walking further at just 1-2 times per day.  A. You Can Walk For A Certain Length Of Time Each Day    Walk 6 minutes 2 times per day.  Increase 1 minutes every 7 days.  Work up to 25-30 minutes (1-2 times per day).   Example:   Day 1-2 6 minutes 2 times per day   Day 7-8 7 minutes 2 times per day   Day 13-14 8-9 minutes 2 times per day  Or you can use your exercise bike and start with 5-6 mins on there  with little to no resistance.  Same thing, add approx 1 min every week.  When you get up to about 10 mins, add a little bit of resistance.  You may need to decrease the time by 1-2 mins and that is ok.

## 2016-06-05 NOTE — Telephone Encounter (Signed)
Dr. Toney Rakes,   I am looking for the order for Mr. Antonio [name]'s AFO and am not seeing it in [facility].  Please re-enter order if you have time for R AFO due to foot drop and gait abnormality.    Thanks,  Cameron Sprang, PT, MPT [facility] [address] [place], [place], [address] Phone: [phone]   Fax:  [phone] 06/05/16, 7:55 AM

## 2016-06-05 NOTE — Telephone Encounter (Signed)
Antonio [name], can you investigate

## 2016-06-05 NOTE — Progress Notes (Signed)
Subjective: 76 year old male presents the office they for follow-up evaluation of right heel pain as well as for onychomycosis. He states that the heel pain is completely resolved he said no issues. He denies any swelling or redness to his heel. Also states the nails are slowly improving and denies any pain in the nails and denies any swelling redness or drainage. Denies any systemic complaints such as fevers, chills, nausea, vomiting. No acute changes since last appointment, and no other complaints at this time.   He is continued on Lamisil as well as in the topical antifungal.  Objective: AAO x3, NAD DP/PT pulses palpable bilaterally, CRT less than 3 seconds There is no tenderness palpation along the plantar medial tubercle of the calcaneus at insertion. There is no edema, erythema, increase in warmth. There is no other areas of tenderness bilateral lower chemise. The nails continue to be hypertrophic, dystrophic, discolored other does appear to be clearing along the proximal nail border. No open lesions or pre-ulcerative lesions.  No pain with calf compression, swelling, warmth, erythema  Assessment: Onychomycosis with resolved heel pain  Plan: -All treatment options discussed with the patient including all alternatives, risks, complications.  -Regards the heel pain is completely resolved. Continues wondering reoccurrence. Continue stretching, icing exercises as well as supportive shoe gear. -I reviewed the liver function studies with the patient. He is continued to take Lamisil although we did call him to stop this medicine. We'll recheck liver function study today. This is ordered. Continue topical antifungal. Will call with with the results of the blood work. Debrided the toenails today sharply without any complications or bleeding.- -Patient encouraged to call the office with any questions, concerns, change in symptoms.   Celesta Gentile, DPM

## 2016-06-05 NOTE — Telephone Encounter (Signed)
It is under medication list. I replaced an order and should be under referrals.

## 2016-06-07 LAB — HEPATIC FUNCTION PANEL
ALBUMIN: 3.6 g/dL (ref 3.6–5.1)
ALT: 19 U/L (ref 9–46)
AST: 16 U/L (ref 10–35)
Alkaline Phosphatase: 136 U/L — ABNORMAL HIGH (ref 40–115)
BILIRUBIN INDIRECT: 0.2 mg/dL (ref 0.2–1.2)
Bilirubin, Direct: 0.1 mg/dL (ref <=0.2)
Total Bilirubin: 0.3 mg/dL (ref 0.2–1.2)
Total Protein: 6.5 g/dL (ref 6.1–8.1)

## 2016-06-09 ENCOUNTER — Telehealth: Payer: Self-pay | Admitting: *Deleted

## 2016-06-09 NOTE — Telephone Encounter (Signed)
-----   Message from Trula Slade, DPM sent at 06/09/2016  7:13 AM EDT ----- Levels improved. Finish 3 months of lamisil total but do no do longer then 3 months.

## 2016-06-12 ENCOUNTER — Ambulatory Visit: Payer: Medicare HMO | Admitting: Rehabilitation

## 2016-06-12 ENCOUNTER — Encounter: Payer: Self-pay | Admitting: Rehabilitation

## 2016-06-12 DIAGNOSIS — R2689 Other abnormalities of gait and mobility: Secondary | ICD-10-CM

## 2016-06-12 DIAGNOSIS — M6281 Muscle weakness (generalized): Secondary | ICD-10-CM

## 2016-06-12 DIAGNOSIS — M21371 Foot drop, right foot: Secondary | ICD-10-CM | POA: Diagnosis not present

## 2016-06-12 DIAGNOSIS — R29898 Other symptoms and signs involving the musculoskeletal system: Secondary | ICD-10-CM | POA: Diagnosis not present

## 2016-06-12 DIAGNOSIS — R2681 Unsteadiness on feet: Secondary | ICD-10-CM | POA: Diagnosis not present

## 2016-06-12 DIAGNOSIS — S63591D Other specified sprain of right wrist, subsequent encounter: Secondary | ICD-10-CM | POA: Diagnosis not present

## 2016-06-12 NOTE — Therapy (Signed)
[facility] [address] [place], [place], [address] Phone: [phone]   Fax:  [phone]  Physical Therapy Treatment  Patient Details  Name: Antonio [name] MRN: [id number] Date of Birth: 06/22/1940 Referring Provider: Alonza Bogus, MD  Encounter Date: 06/12/2016      PT End of Session - 06/12/16 1152    Visit Number 5   Number of Visits 9   Date for PT Re-Evaluation 07/03/16   Authorization Type [employer] MCR-G code on every 10th visit   PT Start Time 1149   PT Stop Time 1231   PT Time Calculation (min) 42 min   Activity Tolerance Patient tolerated treatment well   Behavior During Therapy [facility] for tasks assessed/performed      Past Medical History:  Diagnosis Date  . Anemia    hx  . 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 Chronic back pain   . Depression   . GERD (gastroesophageal reflux disease)   . Hyperlipidemia    "borderline"    Past Surgical History:  Procedure Laterality Date  . BACK SURGERY    . LUMBAR LAMINECTOMY/DECOMPRESSION MICRODISCECTOMY  09/06/2011   Procedure: LUMBAR LAMINECTOMY/DECOMPRESSION MICRODISCECTOMY 1 LEVEL;  Surgeon: Hosie Spangle, MD;  Location: [place] [facility];  Service: Neurosurgery;  Laterality: Left;  Left lumbar three-four Lumbar Laminotomy/microdiskectomy  . LUMBAR LAMINECTOMY/DECOMPRESSION MICRODISCECTOMY Left 12/26/2012   Procedure: LUMBAR LAMINECTOMY/DECOMPRESSION MICRODISCECTOMY LEFT LUMBAR THREE-FOUR ;  Surgeon: Hosie Spangle, MD;  Location: [facility];  Service: Neurosurgery;  Laterality: Left;  . POLYPECTOMY     removed small intestine  . ROTATOR CUFF REPAIR Left   . TONSILLECTOMY      There were no vitals filed for this visit.      Subjective Assessment - 06/12/16 1151    Subjective No changes, no falls.     Limitations House hold activities;Walking   Currently in Pain? No/denies                          [facility] Adult PT Treatment/Exercise - 06/12/16 0001      Ambulation/Gait   Ambulation/Gait Yes   Ambulation/Gait Assistance 5: Supervision   Ambulation/Gait Assistance Details Gerald Stabs present today from [place] for AFO assessment.  Had pt ambulate x 350' x 1 without brace.  Note slight foot drag, but no overt toe catch/LOB.  Note he did use quad tip cane today and demonstrates slower gait speed with reports of focusing on heel to toe contact.  PT felt that foot up brace may be better option at this time and therefore trialed this x 350' with quad tip cane and note that this markedly improved gait with cues for improved speed and stride length.  Provided information on how to order on [employer] and pt verbalizes that he will order and wear.     Ambulation Distance (Feet) 350 Feet  x 2 reps   Assistive device Other (Comment)  quad tip cane    Gait Pattern Step-through pattern;Decreased arm swing - right;Decreased arm swing - left;Decreased stance time - right;Decreased hip/knee flexion - right;Decreased dorsiflexion - right;Right foot flat;Trunk flexed;Lateral hip instability;Poor foot clearance - right   Ambulation Surface Level;Indoor     Neuro Re-ed    Neuro Re-ed Details  In // bars worked on elicitation of ankle and hip strategy on small rocker board in vertical orientation maintaining balance x 2 sets of 30  secs progressing to head turns up/down and side to side x 10 reps each.  Pt with tendency to lose balance posteriorly, therefore moved to standing on ramp incline on blue therapy mat.  Feet apart EC x 2 reps of 15 secs, feet staggered maintaining balance x 20 secs on each side with cues for posture.  Ended balance with wall bumps for improved hip strategy activation.  Performed x 10 reps with feet approx 4-5" from wall.                  PT Education - 06/12/16 1742    Education provided Yes   Education Details ordering foot up brace on [employer]) Educated Patient   Methods Explanation;Handout   Comprehension Verbalized understanding          PT Short Term Goals - 06/12/16 1152      PT SHORT TERM GOAL #1   Title Pt will demonstrate understanding of initial HEP in order to indicate improved functional mobility and decreased fall risk.  (Target Date: 06/01/16)   Baseline Pt not consistently performing all exercises, needs many cues to complete correctly.    Time 4   Period Weeks   Status Partially Met     PT SHORT TERM GOAL #2   Title Pt will improve DGI score to 18/24 in order to indicate decreased fall risk.     Baseline 18/24 on 06/05/16   Time 4   Period Weeks   Status Achieved     PT SHORT TERM GOAL #3   Title Will assess 6MWT and improve distance by 150' in order to indicate improved functional endurance.    Baseline 1144' on eval to 1049' on 06/05/16   Time 4   Period Weeks   Status Not Met     PT SHORT TERM GOAL #4   Title Will assess need for foot up brace vs AFO in order to allow adequate R foot clearance and decreased fall risk.     Baseline met    Time 4   Period Weeks   Status Achieved     PT SHORT TERM GOAL #5   Title Pt will ambulate up to 300' over varying indoor surfaces at mod I level (with or without device) in order to indicate safe home negotiation.     Baseline met with quad tip cane on 06/12/16   Time 4   Period Weeks   Status Achieved     PT SHORT TERM GOAL #6   Title Pt will negotiate up/down 4 steps with single rail in alternating fashion at mod I level in order to indicate safe negotiation at home.    Baseline met 06/05/16   Time 4   Period Weeks   Status Achieved           PT Long Term Goals - 05/04/16 1518      PT LONG TERM GOAL #1   Title Pt will be independent with HEP in order to indicate improved functional mobility and decreased fall risk.  (Target Date: 06/29/16)   Time 8   Period Weeks   Status New     PT LONG TERM GOAL #2   Title Pt will improve DGI  >/=21/24 in order to indicate decreased fall risk.    Time 8   Period Weeks   Status New     PT LONG TERM GOAL #3   Title Pt will improve 6MWT by 150' from baseline in order to  indicate improved functional endurance.    Time 8   Period Weeks   Status New     PT LONG TERM GOAL #4   Title Pt will improve gait speed to >/=2.62 ft/sec in order to indicate improved efficiency of gait and ability to ambulate safely in community.     Time 8   Period Weeks   Status New     PT LONG TERM GOAL #5   Title Pt will ambulate up to 500' over grassy surfaces (also demonstrating ability to bend/return to standing) at mod I level in order to indicate safe return to leisure activity/yard work.    Time 8   Period Weeks   Status New               Plan - 06/12/16 1152    Clinical Impression Statement Pt met final STG today for gait with use of foot up brace and quad tip cane.  Also note that Gerald Stabs from [name] present, however PT felt that at this time foot up brace was sufficient where it was not previously to control foot drop.  Also continue to work on high level balance.  Pt fatigued following balance exercises, but tolerated well.     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, decreased cognition/awareness   PT Frequency 1x / week   PT Duration 8 weeks   PT Treatment/Interventions ADLs/Self Care Home Management;Electrical Stimulation;DME Instruction;Gait training;Stair training;Functional mobility training;Therapeutic activities;Therapeutic exercise;Balance training;Neuromuscular re-education;Cognitive remediation;Patient/family education;Orthotic Fit/Training;Vestibular;Energy conservation   PT Next Visit Plan check compliance with HEP, balance, stepping tasks for improved stride length, assist with donning foot up brace once he gets his   PT Home Exercise Plan see initial HEP 05/04/16    Consulted and Agree with Plan of Care Patient      Patient will benefit from skilled therapeutic intervention in order to improve the following deficits and impairments:  Abnormal gait, Decreased activity tolerance, Decreased balance, Decreased cognition, Decreased endurance, Decreased knowledge of precautions, Decreased safety awareness, Decreased strength, Impaired perceived functional ability, Impaired flexibility, Improper body mechanics  Visit Diagnosis: Unsteadiness on feet  Muscle weakness (generalized)  Other abnormalities of gait and mobility     Problem List Patient Active Problem List   Diagnosis Date Noted  . Hyperlipidemia   . HNP (herniated nucleus pulposus), lumbar 12/26/2012    Cameron Sprang, PT, MPT [facility] [address] [place], [place], [address] Phone: [phone]   Fax:  [phone] 06/12/16, 5:46 PM  Name: Antonio [name] MRN: [id number] Date of Birth: 08/07/1940
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
## 2016-06-15 ENCOUNTER — Encounter: Payer: Self-pay | Admitting: Psychology

## 2016-06-15 ENCOUNTER — Ambulatory Visit (INDEPENDENT_AMBULATORY_CARE_PROVIDER_SITE_OTHER): Payer: Medicare HMO | Admitting: Psychology

## 2016-06-15 DIAGNOSIS — F03A0 Unspecified dementia, mild, without behavioral disturbance, psychotic disturbance, mood disturbance, and anxiety: Secondary | ICD-10-CM

## 2016-06-15 DIAGNOSIS — R69 Illness, unspecified: Secondary | ICD-10-CM | POA: Diagnosis not present

## 2016-06-15 DIAGNOSIS — F0390 Unspecified dementia without behavioral disturbance: Secondary | ICD-10-CM | POA: Diagnosis not present

## 2016-06-15 NOTE — Patient Instructions (Signed)
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 Results of current evaluation revealed significant deficits in aspects of executive functioning including mental flexibility/set-shifting, phonemic verbal fluency, free recall of non-contextual information (while free recall of contextual information is intact), working memory (ie, complex attention) and abstract reasoning. There is evidence that his cognitive deficits are interfering with his ability to manage complex ADLs such as driving. As such, diagnostic criteria for a dementia syndrome are met. His profile is strongly suggestive of frontal-subcortical involvement. Because there was only mild small vessel disease on MRI of the brain, I do not suspect vascular dementia. Parkinsonian disorders are being considered at this time by Dr. 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 Common neuropsychological effects of PSP include delayed responses in conversational speech, reduced phonemic verbal fluency, slowed information processing speed, difficulty with memory retrieval, reduced abstract reasoning and reduced mental flexibility/set-shifting. Behaviorally, patients with PSP often exhibit apathy, reduced motivation and withdrawal.     Recommendations/Plan: Based on the findings of the present evaluation, the following recommendations are offered:  1. Fall prevention - walker! (Given frequent falls and risk of associated head injury / brain bleed - and given multiple small dogs in the house which increases risk for falls) 2. Will require assistance with all complex ADLs - medications, finances, appointments  3. Should not drive 4. Should continue to get social interaction and mental stimulation if possible
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
## 2016-06-16 ENCOUNTER — Encounter: Payer: Self-pay | Admitting: Neurology

## 2016-06-16 DIAGNOSIS — R69 Illness, unspecified: Secondary | ICD-10-CM | POA: Diagnosis not present

## 2016-06-20 ENCOUNTER — Encounter: Payer: Self-pay | Admitting: Neurology

## 2016-06-20 ENCOUNTER — Ambulatory Visit (INDEPENDENT_AMBULATORY_CARE_PROVIDER_SITE_OTHER): Payer: Medicare HMO | Admitting: Neurology

## 2016-06-20 VITALS — BP 120/70 | HR 80 | Ht 64.0 in | Wt 157.0 lb

## 2016-06-20 DIAGNOSIS — D649 Anemia, unspecified: Secondary | ICD-10-CM

## 2016-06-20 DIAGNOSIS — F03A0 Unspecified dementia, mild, without behavioral disturbance, psychotic disturbance, mood disturbance, and anxiety: Secondary | ICD-10-CM

## 2016-06-20 DIAGNOSIS — R296 Repeated falls: Secondary | ICD-10-CM

## 2016-06-20 DIAGNOSIS — R2681 Unsteadiness on feet: Secondary | ICD-10-CM | POA: Diagnosis not present

## 2016-06-20 DIAGNOSIS — N289 Disorder of kidney and ureter, unspecified: Secondary | ICD-10-CM | POA: Diagnosis not present

## 2016-06-20 DIAGNOSIS — R69 Illness, unspecified: Secondary | ICD-10-CM | POA: Diagnosis not present

## 2016-06-20 DIAGNOSIS — F0390 Unspecified dementia without behavioral disturbance: Secondary | ICD-10-CM | POA: Diagnosis not present

## 2016-06-21 ENCOUNTER — Telehealth: Payer: Self-pay | Admitting: Neurology

## 2016-06-21 LAB — CBC WITH DIFFERENTIAL/PLATELET
BASOS ABS: 0 {cells}/uL (ref 0–200)
Basophils Relative: 0 %
EOS ABS: 0 {cells}/uL — AB (ref 15–500)
EOS PCT: 0 %
HCT: 36.3 % — ABNORMAL LOW (ref 38.5–50.0)
HEMOGLOBIN: 11.4 g/dL — AB (ref 13.2–17.1)
LYMPHS ABS: 816 {cells}/uL — AB (ref 850–3900)
Lymphocytes Relative: 12 %
MCH: 27.9 pg (ref 27.0–33.0)
MCHC: 31.4 g/dL — ABNORMAL LOW (ref 32.0–36.0)
MCV: 89 fL (ref 80.0–100.0)
MPV: 9.3 fL (ref 7.5–12.5)
Monocytes Absolute: 612 cells/uL (ref 200–950)
Monocytes Relative: 9 %
NEUTROS ABS: 5372 {cells}/uL (ref 1500–7800)
Neutrophils Relative %: 79 %
Platelets: 256 10*3/uL (ref 140–400)
RBC: 4.08 MIL/uL — ABNORMAL LOW (ref 4.20–5.80)
RDW: 15 % (ref 11.0–15.0)
WBC: 6.8 10*3/uL (ref 3.8–10.8)

## 2016-06-21 LAB — RPR

## 2016-06-21 LAB — VITAMIN B12: Vitamin B-12: >2000 pg/mL — ABNORMAL HIGH (ref 200–1100)

## 2016-06-21 LAB — TSH: TSH: 1.25 mIU/L (ref 0.40–4.50)

## 2016-06-21 LAB — FOLATE: Folate: 17.8 ng/mL (ref >5.4)

## 2016-06-21 NOTE — Telephone Encounter (Signed)
Mychart message sent to patient. Labs sent to Dr. Fara Olden.

## 2016-06-21 NOTE — Telephone Encounter (Signed)
-----   Message from [place], DO sent at 06/21/2016  7:26 AM EDT ----- Let pt know that labs generally ok.  B12 is high and likely doesn't need the supplement.  Is just slightly anemic but better than in the past.  Send PCP labs please via fax.

## 2016-06-22 ENCOUNTER — Ambulatory Visit: Payer: Medicare HMO | Admitting: Rehabilitation

## 2016-06-22 ENCOUNTER — Encounter: Payer: Self-pay | Admitting: Rehabilitation

## 2016-06-22 DIAGNOSIS — R29898 Other symptoms and signs involving the musculoskeletal system: Secondary | ICD-10-CM | POA: Diagnosis not present

## 2016-06-22 DIAGNOSIS — R2689 Other abnormalities of gait and mobility: Secondary | ICD-10-CM

## 2016-06-22 DIAGNOSIS — M6281 Muscle weakness (generalized): Secondary | ICD-10-CM | POA: Diagnosis not present

## 2016-06-22 DIAGNOSIS — R2681 Unsteadiness on feet: Secondary | ICD-10-CM

## 2016-06-22 DIAGNOSIS — M21371 Foot drop, right foot: Secondary | ICD-10-CM | POA: Diagnosis not present

## 2016-06-22 NOTE — Therapy (Signed)
[facility] [address] [place], [place], [address] Phone: [phone]   Fax:  [phone]  Physical Therapy Treatment  Patient Details  Name: Antonio [name] MRN: [id number] Date of Birth: [birth year] Referring Provider: Alonza Bogus, MD  Encounter Date: 06/22/2016      PT End of Session - 06/22/16 1024    Visit Number 6   Number of Visits 9   Date for PT Re-Evaluation 07/03/16   Authorization Type [employer] MCR-G code on every 10th visit   PT Start Time 1022  pt arrived to session late   PT Stop Time 1100   PT Time Calculation (min) 38 min   Activity Tolerance Patient tolerated treatment well   Behavior During Therapy [facility] for tasks assessed/performed      Past Medical History:  Diagnosis Date  . Anemia    hx  . 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 Chronic back pain   . Depression   . GERD (gastroesophageal reflux disease)   . Hyperlipidemia    "borderline"    Past Surgical History:  Procedure Laterality Date  . BACK SURGERY    . LUMBAR LAMINECTOMY/DECOMPRESSION MICRODISCECTOMY  09/06/2011   Procedure: LUMBAR LAMINECTOMY/DECOMPRESSION MICRODISCECTOMY 1 LEVEL;  Surgeon: Hosie Spangle, MD;  Location: [facility];  Service: Neurosurgery;  Laterality: Left;  Left lumbar three-four Lumbar Laminotomy/microdiskectomy  . LUMBAR LAMINECTOMY/DECOMPRESSION MICRODISCECTOMY Left 12/26/2012   Procedure: LUMBAR LAMINECTOMY/DECOMPRESSION MICRODISCECTOMY LEFT LUMBAR THREE-FOUR ;  Surgeon: Hosie Spangle, MD;  Location: [place] [facility];  Service: Neurosurgery;  Laterality: Left;  . POLYPECTOMY     removed small intestine  . ROTATOR CUFF REPAIR Left   . TONSILLECTOMY      There were no vitals filed for this visit.      Subjective Assessment - 06/22/16 1023    Subjective Reports going to MDs since last visit, didn't hear "what I wanted to hear." Note that they are going to do  CT on June 6th for differential of PSP.    Limitations House hold activities;Walking   Currently in Pain? No/denies                         [facility] Adult PT Treatment/Exercise - 06/22/16 1026      Neuro Re-ed    Neuro Re-ed Details  Performed corner balance tasks for increased vestibular challenge; on pillow feet apart EO x 2 sets of 30 secs>EO w/ head turns up/down x 10 reps, side to side x 10 reps and diagonally both directions x 10 reps.  Also had pt close eyes and place feet together x 2 sets of 30 secs.  Tolerated well but with increased sway, recommend he continue these exercises at home.  Progressed to counter top walking tandem x 2 reps forwards/backwards and marching slowly for SLS challenge x 2 reps forwards/backwards.  Pt doing well maintaining upright posture and using hands very lightly.  Worked on postural control and balance with large stride by stepping forward/backwards rocking forward onto toes and stepping back rocking back onto heel.  Performed x 10 reps on each side with single UE support.       Exercises   Exercises Other Exercises   Other Exercises  Seated PWR! stepping exercise to improve hip flexor strength.  Performed x 10 reps each side with education to add to HEP (provided paper handout).  PT Education - 06/22/16 1023    Education provided Yes   Education Details importance of using cane at all times   Person(s) Educated Patient   Methods Explanation   Comprehension Verbalized understanding          PT Short Term Goals - 06/12/16 1152      PT SHORT TERM GOAL #1   Title Pt will demonstrate understanding of initial HEP in order to indicate improved functional mobility and decreased fall risk.  (Target Date: 06/01/16)   Baseline Pt not consistently performing all exercises, needs many cues to complete correctly.    Time 4   Period Weeks   Status Partially Met     PT SHORT TERM GOAL #2   Title Pt will improve DGI  score to 18/24 in order to indicate decreased fall risk.     Baseline 18/24 on 06/05/16   Time 4   Period Weeks   Status Achieved     PT SHORT TERM GOAL #3   Title Will assess 6MWT and improve distance by 150' in order to indicate improved functional endurance.    Baseline 1144' on eval to 1049' on 06/05/16   Time 4   Period Weeks   Status Not Met     PT SHORT TERM GOAL #4   Title Will assess need for foot up brace vs AFO in order to allow adequate R foot clearance and decreased fall risk.     Baseline met    Time 4   Period Weeks   Status Achieved     PT SHORT TERM GOAL #5   Title Pt will ambulate up to 300' over varying indoor surfaces at mod I level (with or without device) in order to indicate safe home negotiation.     Baseline met with quad tip cane on 06/12/16   Time 4   Period Weeks   Status Achieved     PT SHORT TERM GOAL #6   Title Pt will negotiate up/down 4 steps with single rail in alternating fashion at mod I level in order to indicate safe negotiation at home.    Baseline met 06/05/16   Time 4   Period Weeks   Status Achieved           PT Long Term Goals - 05/04/16 1518      PT LONG TERM GOAL #1   Title Pt will be independent with HEP in order to indicate improved functional mobility and decreased fall risk.  (Target Date: 06/29/16)   Time 8   Period Weeks   Status New     PT LONG TERM GOAL #2   Title Pt will improve DGI >/=21/24 in order to indicate decreased fall risk.    Time 8   Period Weeks   Status New     PT LONG TERM GOAL #3   Title Pt will improve 6MWT by 150' from baseline in order to indicate improved functional endurance.    Time 8   Period Weeks   Status New     PT LONG TERM GOAL #4   Title Pt will improve gait speed to >/=2.62 ft/sec in order to indicate improved efficiency of gait and ability to ambulate safely in community.     Time 8   Period Weeks   Status New     PT LONG TERM GOAL #5   Title Pt will ambulate up to 500' over  grassy surfaces (also demonstrating ability to bend/return to standing) at  mod I level in order to indicate safe return to leisure activity/yard work.    Time 8   Period Weeks   Status New               Plan - 06/22/16 1024    Clinical Impression Statement Skilled session focused on high level balance, postural control and stepping strategies to carryover to improved gait.  Pt tolerated well with rest breaks as needed.     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, decreased cognition/awareness   PT Frequency 1x / week   PT Duration 8 weeks   PT Treatment/Interventions ADLs/Self Care Home Management;Electrical Stimulation;DME Instruction;Gait training;Stair training;Functional mobility training;Therapeutic activities;Therapeutic exercise;Balance training;Neuromuscular re-education;Cognitive remediation;Patient/family education;Orthotic Fit/Training;Vestibular;Energy conservation   PT Next Visit Plan check compliance with HEP, balance, stepping tasks for improved stride length, assist with donning foot up brace once he gets his   PT Home Exercise Plan see initial HEP 05/04/16   Consulted and Agree with Plan of Care Patient      Patient will benefit from skilled therapeutic intervention in order to improve the following deficits and impairments:  Abnormal gait, Decreased activity tolerance, Decreased balance, Decreased cognition, Decreased endurance, Decreased knowledge of precautions, Decreased safety awareness, Decreased strength, Impaired perceived functional ability, Impaired flexibility, Improper body mechanics  Visit Diagnosis: Muscle weakness (generalized)  Unsteadiness on feet  Other abnormalities of gait and mobility     Problem List Patient Active Problem List   Diagnosis Date Noted  . Hyperlipidemia   . HNP (herniated nucleus pulposus), lumbar 12/26/2012     Cameron Sprang, PT, MPT [facility] [place] [facility] [address] [place], [place], [address] Phone: [phone]   Fax:  [phone] 06/22/16, 1:16 PM  Name: Antonio [name] MRN: [id number] Date of Birth: 1940/11/13

## 2016-06-27 DIAGNOSIS — G4733 Obstructive sleep apnea (adult) (pediatric): Secondary | ICD-10-CM | POA: Diagnosis not present

## 2016-06-28 DIAGNOSIS — R251 Tremor, unspecified: Secondary | ICD-10-CM | POA: Diagnosis not present

## 2016-06-29 ENCOUNTER — Ambulatory Visit: Payer: Medicare HMO | Attending: Neurology | Admitting: Rehabilitation

## 2016-06-29 ENCOUNTER — Encounter: Payer: Self-pay | Admitting: Rehabilitation

## 2016-06-29 DIAGNOSIS — M6281 Muscle weakness (generalized): Secondary | ICD-10-CM | POA: Insufficient documentation

## 2016-06-29 DIAGNOSIS — R2689 Other abnormalities of gait and mobility: Secondary | ICD-10-CM

## 2016-06-29 DIAGNOSIS — R2681 Unsteadiness on feet: Secondary | ICD-10-CM | POA: Diagnosis not present

## 2016-06-29 DIAGNOSIS — M21371 Foot drop, right foot: Secondary | ICD-10-CM | POA: Diagnosis not present

## 2016-06-29 NOTE — Therapy (Signed)
[facility] [address] [place], [place], [address] Phone: [phone]   Fax:  [phone]  Physical Therapy Treatment  Patient Details  Name: Antonio [name] MRN: [id number] Date of Birth: Jun 05, 1940 Referring Provider: Alonza Bogus, MD  Encounter Date: 06/29/2016      Antonio [name] End of Session - 06/29/16 1115    Visit Number 7   Number of Visits 13  per updated POC   Date for Antonio [name] Re-Evaluation 07/29/16  per updated POC   Authorization Type [employer] MCR-G code on every 10th visit   Antonio [name] Start Time 1108  Antonio [name] arrived late to session   Antonio [name] Stop Time 1146   Antonio [name] Time Calculation (min) 38 min   Activity Tolerance Patient tolerated treatment well   Behavior During Therapy [facility] for tasks assessed/performed      Past Medical History:  Diagnosis Date  . Anemia    hx  . 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 Chronic back pain   . Depression   . GERD (gastroesophageal reflux disease)   . Hyperlipidemia    "borderline"    Past Surgical History:  Procedure Laterality Date  . BACK SURGERY    . LUMBAR LAMINECTOMY/DECOMPRESSION MICRODISCECTOMY  09/06/2011   Procedure: LUMBAR LAMINECTOMY/DECOMPRESSION MICRODISCECTOMY 1 LEVEL;  Surgeon: Hosie Spangle, MD;  Location: [facility];  Service: Neurosurgery;  Laterality: Left;  Left lumbar three-four Lumbar Laminotomy/microdiskectomy  . LUMBAR LAMINECTOMY/DECOMPRESSION MICRODISCECTOMY Left 12/26/2012   Procedure: LUMBAR LAMINECTOMY/DECOMPRESSION MICRODISCECTOMY LEFT LUMBAR THREE-FOUR ;  Surgeon: Hosie Spangle, MD;  Location: [place];  Service: Neurosurgery;  Laterality: Left;  . POLYPECTOMY     removed small intestine  . ROTATOR CUFF REPAIR Left   . TONSILLECTOMY      There were no vitals filed for this visit.      Subjective Assessment - 06/29/16 1108    Subjective "I had testing at [facility] all day yesterday.  They are going to give the  results to my doctor and I will go see him to get the results."   Limitations House hold activities;Walking   Currently in Pain? No/denies            [facility] Antonio [name] Assessment - 06/29/16 1134      6 Minute Walk- Baseline   6 Minute Walk- Baseline yes   HR (bpm) 95   02 Sat (%RA) 94 %   Modified Borg Scale for Dyspnea 0- Nothing at all   Perceived Rate of Exertion (Borg) 6-     6 Minute walk- Post Test   6 Minute Walk Post Test yes   HR (bpm) 118   02 Sat (%RA) 96 %   Modified Borg Scale for Dyspnea 4- somewhat severe   Perceived Rate of Exertion (Borg) 13- Somewhat hard     6 minute walk test results    Aerobic Endurance Distance Walked 1160   Endurance additional comments No LOB, performed with use of R foot up brace and quad tip cane.                      [facility] Adult Antonio [name] Treatment/Exercise - 06/29/16 0001      Ambulation/Gait   Gait velocity 3.29 ft/sec with foot up brace and quad tip cane   Stairs Yes   Stairs Assistance 6: Modified independent (Device/Increase time)  increased time   Stair Management Technique No rails;Alternating pattern;Forwards   Number  of Stairs 8   Height of Stairs 6     Standardized Balance Assessment   Standardized Balance Assessment Dynamic Gait Index     Dynamic Gait Index   Level Surface Normal   Change in Gait Speed Mild Impairment   Gait with Horizontal Head Turns Mild Impairment   Gait with Vertical Head Turns Mild Impairment   Gait and Pivot Turn Normal   Step Over Obstacle Normal   Step Around Obstacles Normal   Steps Normal   Total Score 21     Self-Care   Self-Care Other Self-Care Comments   Other Self-Care Comments  Discussed continuing POC for another 4 weeks as he is making progress but continues to demonstrate balance and endurance deficits.  Continue to encourage walking program.                  Antonio [name] Education - 06/29/16 1115    Education provided Yes   Education Details see self care   Person(s)  Educated Patient   Methods Explanation   Comprehension Verbalized understanding          Antonio [name] Short Term Goals - 06/12/16 1152      Antonio [name] SHORT TERM GOAL #1   Title Antonio [name] will demonstrate understanding of initial HEP in order to indicate improved functional mobility and decreased fall risk.  (Target Date: 06/01/16)   Baseline Antonio [name] not consistently performing all exercises, needs many cues to complete correctly.    Time 4   Period Weeks   Status Partially Met     Antonio [name] SHORT TERM GOAL #2   Title Antonio [name] will improve DGI score to 18/24 in order to indicate decreased fall risk.     Baseline 18/24 on 06/05/16   Time 4   Period Weeks   Status Achieved     Antonio [name] SHORT TERM GOAL #3   Title Will assess 6MWT and improve distance by 150' in order to indicate improved functional endurance.    Baseline 1144' on eval to 1049' on 06/05/16   Time 4   Period Weeks   Status Not Met     Antonio [name] SHORT TERM GOAL #4   Title Will assess need for foot up brace vs AFO in order to allow adequate R foot clearance and decreased fall risk.     Baseline met    Time 4   Period Weeks   Status Achieved     Antonio [name] SHORT TERM GOAL #5   Title Antonio [name] will ambulate up to 300' over varying indoor surfaces at mod I level (with or without device) in order to indicate safe home negotiation.     Baseline met with quad tip cane on 06/12/16   Time 4   Period Weeks   Status Achieved     Antonio [name] SHORT TERM GOAL #6   Title Antonio [name] will negotiate up/down 4 steps with single rail in alternating fashion at mod I level in order to indicate safe negotiation at home.    Baseline met 06/05/16   Time 4   Period Weeks   Status Achieved           Antonio [name] Long Term Goals - 06/29/16 1116      Antonio [name] LONG TERM GOAL #1   Title Antonio [name] will be independent with HEP in order to indicate improved functional mobility and decreased fall risk.  (Updated Target Date: 07/29/16)   Time 4   Period Weeks   Status On-going     Antonio [name] LONG TERM GOAL #2  Title Antonio [name] will improve DGI >/=21/24 in  order to indicate decreased fall risk.    Baseline 21/24 on 06/29/16   Time 8   Period Weeks   Status Achieved     Antonio [name] LONG TERM GOAL #3   Title Antonio [name] will improve 6MWT to 1235' in order to indicate improved functional endurance.    Baseline improved by 111' from STG, only 15' from baseline on 06/29/16   Time 4   Period Weeks   Status Revised     Antonio [name] LONG TERM GOAL #4   Title Antonio [name] will improve gait speed to >/=2.62 ft/sec in order to indicate improved efficiency of gait and ability to ambulate safely in community.     Baseline 3.29 ft/sec with foot up brace and cane    Time 8   Period Weeks   Status Achieved     Antonio [name] LONG TERM GOAL #5   Title Antonio [name] will ambulate up to 500' over grassy surfaces (also demonstrating ability to bend/return to standing) at mod I level in order to indicate safe return to leisure activity/yard work.    Time 4   Period Weeks   Status On-going     Additional Long Term Goals   Additional Long Term Goals Yes     Antonio [name] LONG TERM GOAL #6   Title Antonio [name] will negotiate up/down 8 stairs while carrying object at an independent level in order to indicate safe negotiation of stairs at home while carrying dog.     Time 4   Period Weeks   Status New               Plan - 06/29/16 1923    Clinical Impression Statement Skilled session focused on assessment of LTGs to decide whether to D/C or re-cert for another 4 weeks.  Antonio [name] making progress and has met goal for DGI and gait speed, however would like to see more improvement with endurance and compliance with HEP and use of cane esp over outdoor surfaces.  Antonio [name] agreeable to re-cert for another 4 weeks (4 more visits).     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, decreased cognition/awareness   Antonio [name] Frequency 1x / week   Antonio [name] Duration 4 weeks   Antonio [name] Treatment/Interventions ADLs/Self Care Home Management;Electrical Stimulation;DME  Instruction;Gait training;Stair training;Functional mobility training;Therapeutic activities;Therapeutic exercise;Balance training;Neuromuscular re-education;Cognitive remediation;Patient/family education;Orthotic Fit/Training;Vestibular;Energy conservation   Antonio [name] Next Visit Plan high level balance, gait over outdoor surfaces with quad tip cane and foot up brace, endurance   Antonio [name] Home Exercise Plan see initial HEP 05/04/16   Consulted and Agree with Plan of Care Patient      Patient will benefit from skilled therapeutic intervention in order to improve the following deficits and impairments:  Abnormal gait, Decreased activity tolerance, Decreased balance, Decreased cognition, Decreased endurance, Decreased knowledge of precautions, Decreased safety awareness, Decreased strength, Impaired perceived functional ability, Impaired flexibility, Improper body mechanics  Visit Diagnosis: Muscle weakness (generalized)  Unsteadiness on feet  Other abnormalities of gait and mobility     Problem List Patient Active Problem List   Diagnosis Date Noted  . Hyperlipidemia   . HNP (herniated nucleus pulposus), lumbar 12/26/2012   Antonio [name], Antonio [name], Antonio [name] [facility] [address] [place], [place], [address] Phone: [phone]   Fax:  [phone] 06/29/16, 7:30 PM  Name: Antonio [name] MRN: [id number] Date of Birth: [birth year]

## 2016-07-03 ENCOUNTER — Encounter: Payer: Self-pay | Admitting: Neurology

## 2016-07-03 NOTE — Telephone Encounter (Signed)
Spoke with patient's wife and she would like to keep appt with Dr. Carles Collet at this time.

## 2016-07-03 NOTE — Telephone Encounter (Signed)
Please let wife know that DaT is normal.  As I have told her before, this is not a diagnostic test.  However, this would argue against a parkinsonian syndrome, including PSP.  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  He can cancel follow up appointment with me and will refer him for second opinion at [facility] to see if they can decipher anything further (general neuro clinic).

## 2016-07-05 DIAGNOSIS — Z6827 Body mass index (BMI) 27.0-27.9, adult: Secondary | ICD-10-CM | POA: Diagnosis not present

## 2016-07-05 DIAGNOSIS — R202 Paresthesia of skin: Secondary | ICD-10-CM | POA: Diagnosis not present

## 2016-07-05 DIAGNOSIS — M4722 Other spondylosis with radiculopathy, cervical region: Secondary | ICD-10-CM | POA: Diagnosis not present

## 2016-07-05 DIAGNOSIS — G5603 Carpal tunnel syndrome, bilateral upper limbs: Secondary | ICD-10-CM | POA: Diagnosis not present

## 2016-07-05 DIAGNOSIS — R200 Anesthesia of skin: Secondary | ICD-10-CM | POA: Diagnosis not present

## 2016-07-05 DIAGNOSIS — M5030 Other cervical disc degeneration, unspecified cervical region: Secondary | ICD-10-CM | POA: Diagnosis not present

## 2016-07-06 ENCOUNTER — Ambulatory Visit: Payer: Medicare HMO | Admitting: Rehabilitation

## 2016-07-14 ENCOUNTER — Ambulatory Visit: Payer: Medicare HMO | Admitting: Rehabilitation

## 2016-07-14 ENCOUNTER — Encounter: Payer: Self-pay | Admitting: Rehabilitation

## 2016-07-14 DIAGNOSIS — G5623 Lesion of ulnar nerve, bilateral upper limbs: Secondary | ICD-10-CM | POA: Diagnosis not present

## 2016-07-14 DIAGNOSIS — E559 Vitamin D deficiency, unspecified: Secondary | ICD-10-CM | POA: Diagnosis not present

## 2016-07-14 DIAGNOSIS — R2689 Other abnormalities of gait and mobility: Secondary | ICD-10-CM

## 2016-07-14 DIAGNOSIS — M21371 Foot drop, right foot: Secondary | ICD-10-CM | POA: Diagnosis not present

## 2016-07-14 DIAGNOSIS — M6281 Muscle weakness (generalized): Secondary | ICD-10-CM

## 2016-07-14 DIAGNOSIS — R69 Illness, unspecified: Secondary | ICD-10-CM | POA: Diagnosis not present

## 2016-07-14 DIAGNOSIS — N183 Chronic kidney disease, stage 3 (moderate): Secondary | ICD-10-CM | POA: Diagnosis not present

## 2016-07-14 DIAGNOSIS — I10 Essential (primary) hypertension: Secondary | ICD-10-CM | POA: Diagnosis not present

## 2016-07-14 DIAGNOSIS — R2681 Unsteadiness on feet: Secondary | ICD-10-CM | POA: Diagnosis not present

## 2016-07-14 DIAGNOSIS — G5603 Carpal tunnel syndrome, bilateral upper limbs: Secondary | ICD-10-CM | POA: Diagnosis not present

## 2016-07-14 NOTE — Therapy (Signed)
[facility] [address] [place], [place], [address] Phone: [phone]   Fax:  [phone]  Physical Therapy Treatment  Patient Details  Name: LAFE CLERK MRN: [id number] Date of Birth: 11/11/1940 Referring Provider: Alonza Bogus, MD  Encounter Date: 07/14/2016      PT End of Session - 07/14/16 1023    Visit Number 8   Number of Visits 13  per updated POC   Date for PT Re-Evaluation 07/29/16  per updated POC   Authorization Type [employer] MCR-G code on every 10th visit   PT Start Time 1019   PT Stop Time 1100   PT Time Calculation (min) 41 min   Activity Tolerance Patient tolerated treatment well   Behavior During Therapy [facility] for tasks assessed/performed      Past Medical History:  Diagnosis Date  . Anemia    hx  . 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 Chronic back pain   . Depression   . GERD (gastroesophageal reflux disease)   . Hyperlipidemia    "borderline"    Past Surgical History:  Procedure Laterality Date  . BACK SURGERY    . LUMBAR LAMINECTOMY/DECOMPRESSION MICRODISCECTOMY  09/06/2011   Procedure: LUMBAR LAMINECTOMY/DECOMPRESSION MICRODISCECTOMY 1 LEVEL;  Surgeon: Hosie Spangle, MD;  Location: [facility];  Service: Neurosurgery;  Laterality: Left;  Left lumbar three-four Lumbar Laminotomy/microdiskectomy  . LUMBAR LAMINECTOMY/DECOMPRESSION MICRODISCECTOMY Left 12/26/2012   Procedure: LUMBAR LAMINECTOMY/DECOMPRESSION MICRODISCECTOMY LEFT LUMBAR THREE-FOUR ;  Surgeon: Hosie Spangle, MD;  Location: [facility];  Service: Neurosurgery;  Laterality: Left;  . POLYPECTOMY     removed small intestine  . ROTATOR CUFF REPAIR Left   . TONSILLECTOMY      There were no vitals filed for this visit.      Subjective Assessment - 07/14/16 1022    Subjective Reports no changes, no falls.    Limitations House hold activities;Walking   Currently in Pain?  No/denies                         [facility] Adult PT Treatment/Exercise - 07/14/16 0001      Ambulation/Gait   Stairs Yes   Stairs Assistance 6: Modified independent (Device/Increase time)   Stairs Assistance Details (indicate cue type and reason) Had pt practice stairs first with one rail not carrying item at mod I level.  Then had pt use single rail and carry 6lbs and pt able to do at mod I level.  Progressed to carrying 4lb under one arm, 6lb under the other arm performing stairs x 2 reps in step to fashion.  Feel that he is safe to do this at home by himself.  Did try in this manner in alternating fashion, however felt that S was still needed.  Pt verbalized understanding.     Stair Management Technique One rail Right;Alternating pattern;Step to pattern;Forwards;No rails   Number of Stairs 4  x 5 reps   Height of Stairs 6     Neuro Re-ed    Neuro Re-ed Details  High level balance in // bars standing on blue foam pad with feet apart EO x 20 secs>feet apart EC x 20 secs>feet together EO x 20 secs, feet together EC x 20 secs.  Feet in tandem on blue pad x 20 secs x 2 sets on each side.   Wall bumps on blue foam balance beam x 5  reps with 5 secs holds.  Pt doing markedly better with hip extension and upright posture.  SLS at counter top x 3 sets of 10 secs on each side.  Added this to HEP, see pt instruction.      Exercises   Exercises Other Exercises   Other Exercises  seated nustep x 5 mins to warm up prior to balance and stairs.  BUE/LEs at level 4 resistance with cues to maintain steps per minute in the 60's and to try 5 mins nonstop.  Tolerated well but with fatigue.                 PT Education - 07/14/16 1023    Education provided Yes   Education Details addition to [employer]) Educated Patient   Methods Explanation;Demonstration;Handout   Comprehension Verbalized understanding;Returned demonstration          PT Short Term Goals - 06/12/16 1152       PT SHORT TERM GOAL #1   Title Pt will demonstrate understanding of initial HEP in order to indicate improved functional mobility and decreased fall risk.  (Target Date: 06/01/16)   Baseline Pt not consistently performing all exercises, needs many cues to complete correctly.    Time 4   Period Weeks   Status Partially Met     PT SHORT TERM GOAL #2   Title Pt will improve DGI score to 18/24 in order to indicate decreased fall risk.     Baseline 18/24 on 06/05/16   Time 4   Period Weeks   Status Achieved     PT SHORT TERM GOAL #3   Title Will assess 6MWT and improve distance by 150' in order to indicate improved functional endurance.    Baseline 1144' on eval to 1049' on 06/05/16   Time 4   Period Weeks   Status Not Met     PT SHORT TERM GOAL #4   Title Will assess need for foot up brace vs AFO in order to allow adequate R foot clearance and decreased fall risk.     Baseline met    Time 4   Period Weeks   Status Achieved     PT SHORT TERM GOAL #5   Title Pt will ambulate up to 300' over varying indoor surfaces at mod I level (with or without device) in order to indicate safe home negotiation.     Baseline met with quad tip cane on 06/12/16   Time 4   Period Weeks   Status Achieved     PT SHORT TERM GOAL #6   Title Pt will negotiate up/down 4 steps with single rail in alternating fashion at mod I level in order to indicate safe negotiation at home.    Baseline met 06/05/16   Time 4   Period Weeks   Status Achieved           PT Long Term Goals - 06/29/16 1116      PT LONG TERM GOAL #1   Title Pt will be independent with HEP in order to indicate improved functional mobility and decreased fall risk.  (Updated Target Date: 07/29/16)   Time 4   Period Weeks   Status On-going     PT LONG TERM GOAL #2   Title Pt will improve DGI >/=21/24 in order to indicate decreased fall risk.    Baseline 21/24 on 06/29/16   Time 8   Period Weeks   Status Achieved  PT LONG TERM GOAL #3    Title Pt will improve 6MWT to 1235' in order to indicate improved functional endurance.    Baseline improved by 111' from STG, only 58' from baseline on 06/29/16   Time 4   Period Weeks   Status Revised     PT LONG TERM GOAL #4   Title Pt will improve gait speed to >/=2.62 ft/sec in order to indicate improved efficiency of gait and ability to ambulate safely in community.     Baseline 3.29 ft/sec with foot up brace and cane    Time 8   Period Weeks   Status Achieved     PT LONG TERM GOAL #5   Title Pt will ambulate up to 500' over grassy surfaces (also demonstrating ability to bend/return to standing) at mod I level in order to indicate safe return to leisure activity/yard work.    Time 4   Period Weeks   Status On-going     Additional Long Term Goals   Additional Long Term Goals Yes     PT LONG TERM GOAL #6   Title Pt will negotiate up/down 8 stairs while carrying object at an independent level in order to indicate safe negotiation of stairs at home while carrying dog.     Time 4   Period Weeks   Status New               Plan - 07/14/16 1027    Clinical Impression Statement Skilled session focused on high level balance, endurance and stair training to simulate tasks that pt does at home.  Note marked improvement with balance and hip strategy/hip extension during tasks.  Pt also aware that balance has improved.    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, decreased cognition/awareness   PT Frequency 1x / week   PT Duration 4 weeks   PT Treatment/Interventions ADLs/Self Care Home Management;Electrical Stimulation;DME Instruction;Gait training;Stair training;Functional mobility training;Therapeutic activities;Therapeutic exercise;Balance training;Neuromuscular re-education;Cognitive remediation;Patient/family education;Orthotic Fit/Training;Vestibular;Energy  conservation   PT Next Visit Plan high level balance, gait over outdoor surfaces with quad tip cane and foot up brace, endurance   PT Home Exercise Plan see initial HEP 05/04/16   Consulted and Agree with Plan of Care Patient      Patient will benefit from skilled therapeutic intervention in order to improve the following deficits and impairments:  Abnormal gait, Decreased activity tolerance, Decreased balance, Decreased cognition, Decreased endurance, Decreased knowledge of precautions, Decreased safety awareness, Decreased strength, Impaired perceived functional ability, Impaired flexibility, Improper body mechanics  Visit Diagnosis: Muscle weakness (generalized)  Unsteadiness on feet  Other abnormalities of gait and mobility     Problem List Patient Active Problem List   Diagnosis Date Noted  . Hyperlipidemia   . HNP (herniated nucleus pulposus), lumbar 12/26/2012   Cameron Sprang, PT, MPT [facility] [address] [place], [place], [address] Phone: [phone]   Fax:  [phone] 07/14/16, 12:51 PM  Name: SAAGAR TORTORELLA MRN: [id number] Date of Birth: 26-Sep-1940

## 2016-07-14 NOTE — Patient Instructions (Signed)
SINGLE LIMB STANCE    Do near a counter top.  Stance: single leg on floor. Raise leg. Hold _10__ seconds. Repeat with other leg. _3__ reps per set, __1_ sets per day, _5-7__ days per week  Copyright  VHI. All rights reserved.

## 2016-07-17 DIAGNOSIS — R030 Elevated blood-pressure reading, without diagnosis of hypertension: Secondary | ICD-10-CM | POA: Diagnosis not present

## 2016-07-17 DIAGNOSIS — G5603 Carpal tunnel syndrome, bilateral upper limbs: Secondary | ICD-10-CM | POA: Diagnosis not present

## 2016-07-17 DIAGNOSIS — Z6827 Body mass index (BMI) 27.0-27.9, adult: Secondary | ICD-10-CM | POA: Diagnosis not present

## 2016-07-18 DIAGNOSIS — I10 Essential (primary) hypertension: Secondary | ICD-10-CM | POA: Diagnosis not present

## 2016-07-18 DIAGNOSIS — R600 Localized edema: Secondary | ICD-10-CM | POA: Diagnosis not present

## 2016-07-18 DIAGNOSIS — R69 Illness, unspecified: Secondary | ICD-10-CM | POA: Diagnosis not present

## 2016-07-18 DIAGNOSIS — Z0000 Encounter for general adult medical examination without abnormal findings: Secondary | ICD-10-CM | POA: Diagnosis not present

## 2016-07-18 DIAGNOSIS — R339 Retention of urine, unspecified: Secondary | ICD-10-CM | POA: Diagnosis not present

## 2016-07-18 DIAGNOSIS — H9110 Presbycusis, unspecified ear: Secondary | ICD-10-CM | POA: Diagnosis not present
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
## 2016-07-20 ENCOUNTER — Encounter: Payer: Self-pay | Admitting: Neurology

## 2016-07-20 ENCOUNTER — Ambulatory Visit (INDEPENDENT_AMBULATORY_CARE_PROVIDER_SITE_OTHER): Payer: Medicare HMO | Admitting: Neurology

## 2016-07-20 VITALS — BP 130/60 | HR 84 | Ht 64.0 in | Wt 158.0 lb

## 2016-07-20 DIAGNOSIS — R2681 Unsteadiness on feet: Secondary | ICD-10-CM

## 2016-07-20 DIAGNOSIS — F0390 Unspecified dementia without behavioral disturbance: Secondary | ICD-10-CM

## 2016-07-20 DIAGNOSIS — F03A0 Unspecified dementia, mild, without behavioral disturbance, psychotic disturbance, mood disturbance, and anxiety: Secondary | ICD-10-CM

## 2016-07-20 DIAGNOSIS — R69 Illness, unspecified: Secondary | ICD-10-CM | POA: Diagnosis not present

## 2016-07-21 ENCOUNTER — Ambulatory Visit: Payer: Medicare HMO | Admitting: Rehabilitation

## 2016-07-21 ENCOUNTER — Encounter: Payer: Self-pay | Admitting: Rehabilitation

## 2016-07-21 DIAGNOSIS — M6281 Muscle weakness (generalized): Secondary | ICD-10-CM

## 2016-07-21 DIAGNOSIS — R2689 Other abnormalities of gait and mobility: Secondary | ICD-10-CM

## 2016-07-21 DIAGNOSIS — R2681 Unsteadiness on feet: Secondary | ICD-10-CM

## 2016-07-21 DIAGNOSIS — M21371 Foot drop, right foot: Secondary | ICD-10-CM | POA: Diagnosis not present

## 2016-07-21 NOTE — Therapy (Signed)
Antonio [name] [address] [place], [place], [address] Phone: [phone]   Fax:  [phone]  Physical Therapy Treatment  Patient Details  Name: Antonio [name] MRN: [id number] Date of Birth: 06/24/1940 Referring Provider: Alonza Bogus, MD  Encounter Date: 07/21/2016      PT End of Session - 07/21/16 1544    Visit Number 9   Number of Visits 13  per updated POC   Date for PT Re-Evaluation 07/29/16  per updated POC   Authorization Type [employer] MCR-G code on every 10th visit   PT Start Time 1535   PT Stop Time 1616   PT Time Calculation (min) 41 min   Activity Tolerance Patient tolerated treatment well   Behavior During Therapy [facility] for tasks assessed/performed      Past Medical History:  Diagnosis Date  . Anemia    hx  . 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 Chronic back pain   . Depression   . GERD (gastroesophageal reflux disease)   . Hyperlipidemia    "borderline"    Past Surgical History:  Procedure Laterality Date  . BACK SURGERY    . LUMBAR LAMINECTOMY/DECOMPRESSION MICRODISCECTOMY  09/06/2011   Procedure: LUMBAR LAMINECTOMY/DECOMPRESSION MICRODISCECTOMY 1 LEVEL;  Surgeon: Hosie Spangle, MD;  Location: [place] [facility]UR[facility] ORS;  Service: Neurosurgery;  Laterality: Left;  Left lumbar three-four Lumbar Laminotomy/microdiskectomy  . LUMBAR LAMINECTOMY/DECOMPRESSION MICRODISCECTOMY Left 12/26/2012   Procedure: LUMBAR LAMINECTOMY/DECOMPRESSION MICRODISCECTOMY LEFT LUMBAR THREE-FOUR ;  Surgeon: Hosie Spangle, MD;  Location: [place] [facility]S;  Service: Neurosurgery;  Laterality: Left;  . POLYPECTOMY     removed small intestine  . ROTATOR CUFF REPAIR Left   . TONSILLECTOMY      There were no vitals filed for this visit.      Subjective Assessment - 07/21/16 1543    Subjective Reports no changes, no falls.    Limitations House hold activities;Walking   Currently in Pain?  No/denies            [facility] PT Assessment - 07/21/16 0001      6 Minute Walk- Baseline   6 Minute Walk- Baseline yes   BP (mmHg) 130/62   HR (bpm) 74   02 Sat (%RA) 98 %   Modified Borg Scale for Dyspnea 0- Nothing at all   Perceived Rate of Exertion (Borg) 6-     6 Minute walk- Post Test   6 Minute Walk Post Test yes   BP (mmHg) 176/67   HR (bpm) 93   02 Sat (%RA) 96 %   Modified Borg Scale for Dyspnea 3- Moderate shortness of breath or breathing difficulty   Perceived Rate of Exertion (Borg) 12-     6 minute walk test results    Aerobic Endurance Distance Walked 1167   Endurance additional comments with [facility]                     [facility] Adult PT Treatment/Exercise - 07/21/16 0001      Ambulation/Gait   Ambulation/Gait Yes   Ambulation/Gait Assistance 6: Modified independent (Device/Increase time)   Ambulation/Gait Assistance Details Assessed gait over outdoor surfaces both paved and grassy.  Pt able to ambulate up to 800' at [facility] I level with use of SPC.  Also had pt ambulate over grass simulated surface while picking up objects to simulate gardening tasks that pt may complete at home.  Pt also able  to do at mod I level.     Ambulation Distance (Feet) 800 Feet  and 1167 with 6MWT   Assistive device Straight cane   Gait Pattern Step-through pattern;Decreased arm swing - right;Decreased arm swing - left;Decreased stance time - right;Decreased hip/knee flexion - right;Decreased dorsiflexion - right;Right foot flat;Trunk flexed;Lateral hip instability;Poor foot clearance - right   Ambulation Surface Level;Indoor;Unlevel;Outdoor;Paved;Gravel;Grass   Stairs Yes   Stairs Assistance 7: Independent   Stairs Assistance Details (indicate cue type and reason) Pt able to traverse stairs with single rail and 6lbs weight and also no rail with 6lbs weight on one side and 2.5 lbs in the other arm  to simulate carrying dogs up/down stairs without rail at an independent level.      Stair Management Technique No rails;One rail Right;Alternating pattern   Number of Stairs 8   Height of Stairs 6                PT Education - 07/21/16 2009    Education provided Yes   Education Details progress towards goals to D/C next week   Person(s) Educated Patient   Methods Explanation   Comprehension Verbalized understanding          PT Short Term Goals - 06/12/16 1152      PT SHORT TERM GOAL #1   Title Pt will demonstrate understanding of initial HEP in order to indicate improved functional mobility and decreased fall risk.  (Target Date: 06/01/16)   Baseline Pt not consistently performing all exercises, needs many cues to complete correctly.    Time 4   Period Weeks   Status Partially Met     PT SHORT TERM GOAL #2   Title Pt will improve DGI score to 18/24 in order to indicate decreased fall risk.     Baseline 18/24 on 06/05/16   Time 4   Period Weeks   Status Achieved     PT SHORT TERM GOAL #3   Title Will assess 6MWT and improve distance by 150' in order to indicate improved functional endurance.    Baseline 1144' on eval to 1049' on 06/05/16   Time 4   Period Weeks   Status Not Met     PT SHORT TERM GOAL #4   Title Will assess need for foot up brace vs AFO in order to allow adequate R foot clearance and decreased fall risk.     Baseline met    Time 4   Period Weeks   Status Achieved     PT SHORT TERM GOAL #5   Title Pt will ambulate up to 300' over varying indoor surfaces at mod I level (with or without device) in order to indicate safe home negotiation.     Baseline met with quad tip cane on 06/12/16   Time 4   Period Weeks   Status Achieved     PT SHORT TERM GOAL #6   Title Pt will negotiate up/down 4 steps with single rail in alternating fashion at mod I level in order to indicate safe negotiation at home.    Baseline met 06/05/16   Time 4   Period Weeks   Status Achieved           PT Long Term Goals - 07/21/16 1603      PT LONG  TERM GOAL #1   Title Pt will be independent with HEP in order to indicate improved functional mobility and decreased fall risk.  (Updated Target Date: 07/29/16)  Time 4   Period Weeks   Status On-going     PT LONG TERM GOAL #2   Title Pt will improve DGI >/=21/24 in order to indicate decreased fall risk.    Baseline 21/24 on 06/29/16   Time 8   Period Weeks   Status Achieved     PT LONG TERM GOAL #3   Title Pt will improve 6MWT to 1235' in order to indicate improved functional endurance.    Baseline improved by 111' from STG, only 19' from baseline on 06/29/16, 1167' on 07/21/16 (improved only 7' since last tested)   Time 4   Period Weeks   Status Partially Met     PT LONG TERM GOAL #4   Title Pt will improve gait speed to >/=2.62 ft/sec in order to indicate improved efficiency of gait and ability to ambulate safely in community.     Baseline 3.29 ft/sec with foot up brace and cane    Time 8   Period Weeks   Status Achieved     PT LONG TERM GOAL #5   Title Pt will ambulate up to 500' over grassy surfaces (also demonstrating ability to bend/return to standing) at mod I level in order to indicate safe return to leisure activity/yard work.    Baseline met 07/21/16   Time 4   Period Weeks   Status Achieved     PT LONG TERM GOAL #6   Title Pt will negotiate up/down 8 stairs while carrying object at an independent level in order to indicate safe negotiation of stairs at home while carrying dog.     Baseline met 07/21/16   Time 4   Period Weeks   Status Achieved               Plan - 07/21/16 2010    Clinical Impression Statement Skilled session to begin addressing LTGs for D/C next week.  Pt has met 3/6 LTGs, partially meeting goal for 6MWT (he only made 7' increase, but progress none the less since last check).  Pt continues to demonstrate improvement with endurance, gait quality and safety.  Plan to D/C next week.    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, decreased cognition/awareness   PT Frequency 1x / week   PT Duration 4 weeks   PT Treatment/Interventions ADLs/Self Care Home Management;Electrical Stimulation;DME Instruction;Gait training;Stair training;Functional mobility training;Therapeutic activities;Therapeutic exercise;Balance training;Neuromuscular re-education;Cognitive remediation;Patient/family education;Orthotic Fit/Training;Vestibular;Energy conservation   PT Next Visit Plan LTGs and D/C   PT Home Exercise Plan see initial HEP 05/04/16   Consulted and Agree with Plan of Care Patient      Patient will benefit from skilled therapeutic intervention in order to improve the following deficits and impairments:  Abnormal gait, Decreased activity tolerance, Decreased balance, Decreased cognition, Decreased endurance, Decreased knowledge of precautions, Decreased safety awareness, Decreased strength, Impaired perceived functional ability, Impaired flexibility, Improper body mechanics  Visit Diagnosis: Muscle weakness (generalized)  Unsteadiness on feet  Other abnormalities of gait and mobility     Problem List Patient Active Problem List   Diagnosis Date Noted  . Hyperlipidemia   . HNP (herniated nucleus pulposus), lumbar 12/26/2012    Cameron Sprang, PT, MPT [facility] [address], [place], [address] Phone: [phone]   Fax:  [phone] 07/21/16, 8:15 PM  Name: Antonio [name] MRN: [id number] Date of Birth: 11/08/1940

## 2016-07-28 ENCOUNTER — Ambulatory Visit: Payer: Medicare HMO | Admitting: Rehabilitation

## 2016-07-31 ENCOUNTER — Ambulatory Visit: Payer: Medicare HMO | Attending: Neurology | Admitting: Rehabilitation

## 2016-07-31 ENCOUNTER — Encounter: Payer: Self-pay | Admitting: Rehabilitation

## 2016-07-31 DIAGNOSIS — R2689 Other abnormalities of gait and mobility: Secondary | ICD-10-CM | POA: Diagnosis present

## 2016-07-31 DIAGNOSIS — R2681 Unsteadiness on feet: Secondary | ICD-10-CM

## 2016-07-31 DIAGNOSIS — M6281 Muscle weakness (generalized): Secondary | ICD-10-CM

## 2016-07-31 NOTE — Therapy (Signed)
[facility] [address] [place], [place], [address] Phone: [phone]   Fax:  [phone]  Physical Therapy Treatment and DC Summary   Patient Details  Name: Antonio [name] MRN: [id number] Date of Birth: 1940-04-18 Referring Provider: Alonza Bogus, MD  Encounter Date: 07/31/2016      PT End of Session - 07/31/16 1321    Visit Number 10   Number of Visits 13  per updated POC   Date for PT Re-Evaluation 07/29/16  per updated POC   Authorization Type [employer] MCR-G code on every 10th visit   PT Start Time 1317  DC session, did not need whole time   PT Stop Time 1352   PT Time Calculation (min) 35 min   Activity Tolerance Patient tolerated treatment well   Behavior During Therapy [facility] for tasks assessed/performed      Past Medical History:  Diagnosis Date  . Anemia    hx  . Cancer ([place])    skin cancer to arm excised, 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  . Chronic back pain   . Depression   . GERD (gastroesophageal reflux disease)   . Hyperlipidemia    "borderline"    Past Surgical History:  Procedure Laterality Date  . BACK SURGERY    . LUMBAR LAMINECTOMY/DECOMPRESSION MICRODISCECTOMY  09/06/2011   Procedure: LUMBAR LAMINECTOMY/DECOMPRESSION MICRODISCECTOMY 1 LEVEL;  Surgeon: Hosie Spangle, MD;  Location: [place] [facility];  Service: Neurosurgery;  Laterality: Left;  Left lumbar three-four Lumbar Laminotomy/microdiskectomy  . LUMBAR LAMINECTOMY/DECOMPRESSION MICRODISCECTOMY Left 12/26/2012   Procedure: LUMBAR LAMINECTOMY/DECOMPRESSION MICRODISCECTOMY LEFT LUMBAR THREE-FOUR ;  Surgeon: Hosie Spangle, MD;  Location: [place] [facility];  Service: Neurosurgery;  Laterality: Left;  . POLYPECTOMY     removed small intestine  . ROTATOR CUFF REPAIR Left   . TONSILLECTOMY      There were no vitals filed for this visit.      Subjective Assessment - 07/31/16 1320    Subjective No changes to report, no falls.      Limitations House hold activities;Walking   Currently in Pain? No/denies             TE and NMR:  Went over current HEP and how to progress, see updates in pt instruction.                      PT Education - 07/31/16 1321    Education provided Yes   Education Details HEP and how to progress   Person(s) Educated Patient   Methods Explanation   Comprehension Verbalized understanding          PT Short Term Goals - 06/12/16 1152      PT SHORT TERM GOAL #1   Title Pt will demonstrate understanding of initial HEP in order to indicate improved functional mobility and decreased fall risk.  (Target Date: 06/01/16)   Baseline Pt not consistently performing all exercises, needs many cues to complete correctly.    Time 4   Period Weeks   Status Partially Met     PT SHORT TERM GOAL #2   Title Pt will improve DGI score to 18/24 in order to indicate decreased fall risk.     Baseline 18/24 on 06/05/16   Time 4   Period Weeks   Status Achieved     PT SHORT TERM GOAL #3   Title Will assess 6MWT and improve distance by 150' in order to indicate improved functional endurance.  Baseline 1144' on eval to 1049' on 06/05/16   Time 4   Period Weeks   Status Not Met     PT SHORT TERM GOAL #4   Title Will assess need for foot up brace vs AFO in order to allow adequate R foot clearance and decreased fall risk.     Baseline met    Time 4   Period Weeks   Status Achieved     PT SHORT TERM GOAL #5   Title Pt will ambulate up to 300' over varying indoor surfaces at mod I level (with or without device) in order to indicate safe home negotiation.     Baseline met with quad tip cane on 06/12/16   Time 4   Period Weeks   Status Achieved     PT SHORT TERM GOAL #6   Title Pt will negotiate up/down 4 steps with single rail in alternating fashion at mod I level in order to indicate safe negotiation at home.    Baseline met 06/05/16   Time 4   Period Weeks   Status Achieved            PT Long Term Goals - 07/31/16 1348      PT LONG TERM GOAL #1   Title Pt will be independent with HEP in order to indicate improved functional mobility and decreased fall risk.  (Updated Target Date: 07/29/16)   Baseline met 07/31/16   Time 4   Period Weeks   Status Achieved     PT LONG TERM GOAL #2   Title Pt will improve DGI >/=21/24 in order to indicate decreased fall risk.    Baseline 21/24 on 06/29/16   Time 8   Period Weeks   Status Achieved     PT LONG TERM GOAL #3   Title Pt will improve 6MWT to 1235' in order to indicate improved functional endurance.    Baseline improved by 111' from STG, only 83' from baseline on 06/29/16, 1167' on 07/21/16 (improved only 7' since last tested)   Time 4   Period Weeks   Status Partially Met     PT LONG TERM GOAL #4   Title Pt will improve gait speed to >/=2.62 ft/sec in order to indicate improved efficiency of gait and ability to ambulate safely in community.     Baseline 3.29 ft/sec with foot up brace and cane    Time 8   Period Weeks   Status Achieved     PT LONG TERM GOAL #5   Title Pt will ambulate up to 500' over grassy surfaces (also demonstrating ability to bend/return to standing) at mod I level in order to indicate safe return to leisure activity/yard work.    Baseline met 07/21/16   Time 4   Period Weeks   Status Achieved     PT LONG TERM GOAL #6   Title Pt will negotiate up/down 8 stairs while carrying object at an independent level in order to indicate safe negotiation of stairs at home while carrying dog.     Baseline met 07/21/16   Time 4   Period Weeks   Status Achieved               Plan - 07/31/16 1354    Clinical Impression Statement Skilled session focused on D/C and addressing HEP for final session.  Pt able to complete HEP with PT making slight modifications and recommendations to increase challenge as he progresses.  See pt instruction for  details.  Also continue to discuss walking program and  safety with walking dogs.  Pt verbalized and return demonstration.  Pt ready for D/C.    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, decreased cognition/awareness   PT Frequency 1x / week   PT Duration 4 weeks   PT Treatment/Interventions ADLs/Self Care Home Management;Electrical Stimulation;DME Instruction;Gait training;Stair training;Functional mobility training;Therapeutic activities;Therapeutic exercise;Balance training;Neuromuscular re-education;Cognitive remediation;Patient/family education;Orthotic Fit/Training;Vestibular;Energy conservation   PT Home Exercise Plan see initial HEP 05/04/16   Consulted and Agree with Plan of Care Patient      Patient will benefit from skilled therapeutic intervention in order to improve the following deficits and impairments:  Abnormal gait, Decreased activity tolerance, Decreased balance, Decreased cognition, Decreased endurance, Decreased knowledge of precautions, Decreased safety awareness, Decreased strength, Impaired perceived functional ability, Impaired flexibility, Improper body mechanics  Visit Diagnosis: Muscle weakness (generalized)  Unsteadiness on feet  Other abnormalities of gait and mobility       G-Codes - 08-09-16 1353    Functional Assessment Tool Used (Outpatient Only) DGI 21/24, gait speed 3.29 ft/sec    Functional Limitation Mobility: Walking and moving around   Mobility: Walking and Moving Around Current Status [phone]) At least 1 percent but less than 20 percent impaired, limited or restricted   Mobility: Walking and Moving Around Goal Status [phone]) At least 1 percent but less than 20 percent impaired, limited or restricted   Mobility: Walking and Moving Around Discharge Status [phone]) At least 1 percent but less than 20 percent impaired, limited or restricted      PHYSICAL THERAPY DISCHARGE SUMMARY  Visits from  Start of Care: 10  Current functional level related to goals / functional outcomes: See LTGs above   Remaining deficits: Continues to have high level balance and RLE deficits.  Has HEP to address and is now using cane and R foot up brace.     Education / Equipment: HEP, foot up brace   Plan: Patient agrees to discharge.  Patient goals were met. Patient is being discharged due to meeting the stated rehab goals.  ?????       Problem List Patient Active Problem List   Diagnosis Date Noted  . Hyperlipidemia   . HNP (herniated nucleus pulposus), lumbar 12/26/2012    Cameron Sprang, PT, MPT [facility] [address] [place], [place], [address] Phone: [phone]   Fax:  [phone] 08/09/16, 3:51 PM  Name: KAIREE ISA MRN: [id number] Date of Birth: 13-Mar-1940

## 2016-07-31 NOTE — Patient Instructions (Addendum)
Toe / Heel Raise    Gently rock back on heels and raise toes. Then rock forward on toes and raise heels. Repeat sequence __10__ times per session. Do _5-7___ sessions per week.  Copyright  VHI. All rights reserved.  HIP: Hamstrings - Short Sitting    Rest leg on raised surface. You do not have to put your foot on a step, just scoot to the edge of the chair to make sure leg is straight. Keep knee straight. Lift chest. Hold _60__ seconds. __2-3_ reps per set, _2__ sets per day, _5-7__ days per week  Copyright  VHI. All rights reserved.  Tandem Walking    Walk with each foot directly in front of other, heel of one foot touching toes of other foot with each step. Both feet straight ahead. Do this beside a counter top. Walk forward heel to toe and then backwards heel to toe slowly. Use your hand for support but as you progress decrease the amount of support you use. Repeat x 3 laps down and back. Do 1-2 times per day.    Copyright  VHI. All rights reserved.   "I love a Parade" Lift   March along counter top forwards and then backwards when you get to the end. Make sure you go slow and raise knees as high as you can. Use support as needed, but as you progress try to decrease amount of support.  Repeat ___3_ times. Do _1-2___ sessions per day.  http://gt2.exer.us/344  Copyright  VHI. All rights reserved.    Do these standing in corner with chair in front of you:   Feet Apart (Compliant Surface) Varied Arm Positions - Eyes Closed    Stand on compliant surface: ___pillow or cushion _____ with feet closer than shoulder width apart (not all the way together until this gets easier)  and arms out. Close eyes and visualize upright position. Hold__30__ seconds. Repeat _3___ times per session. Do __1-2__ sessions per day.    Copyright  VHI. All rights reserved.  Feet Together (Compliant Surface) Head Motion - Eyes Open    With eyes open, standing  on compliant surface: __pillow or cushion______, feet together, move head slowly: up and down x 10 reps, side to side x 10 reps.  Repeat _1___ times per session. Do __1-2__ sessions per day.  How to progress:  Speed up head movement with eyes open.  When this gets easier, close your eyes and do the head turns.  You will have to slow down the head movements again, but that's okay.   Copyright  VHI. All rights reserved.   SINGLE LIMB STANCE    Stand by the counter top for support, but be light as you can! Stance: single leg on floor. Raise leg. Hold _10-15__ seconds. Repeat with other leg. _3__ reps per set, _1-2__ sets per day, _5-7__ days per week  Copyright  VHI. All rights reserved.     WALKING  Walking is a great form of exercise to increase your strength, endurance and overall fitness.  A walking program can help you start slowly and gradually build endurance as you go.  Everyone's ability is different, so each person's starting point will be different.  You do not have to follow them exactly.  The are just samples. You should simply find out what's right for you and stick to that program.   In the beginning, you'll start off walking 2-3 times a day for short distances.  As you get stronger, you'll be walking  further at just 1-2 times per day.  A.         You Can Walk For A Certain Length Of Time Each Day                          Walk 6 minutes 2 times per day.             Increase 1 minutes every 7 days.             Work up to 25-30 minutes (1-2 times per day).              Example:                         Day 1-2           6 minutes        2 times per day                         Day 7-8           7 minutes        2 times per day                         Day 13-14       8-9 minutes     2 times per day  Or you can use your exercise bike and start with 5-6 mins on there with little to no resistance.  Same thing, add approx 1 min every week.  When you get up to about 10  mins, add a little bit of resistance.  You may need to decrease the time by 1-2 mins and that is ok.

## 2016-08-07 DIAGNOSIS — G5603 Carpal tunnel syndrome, bilateral upper limbs: Secondary | ICD-10-CM | POA: Diagnosis not present

## 2016-08-07 DIAGNOSIS — G5601 Carpal tunnel syndrome, right upper limb: Secondary | ICD-10-CM | POA: Diagnosis not present

## 2016-08-11 DIAGNOSIS — G4733 Obstructive sleep apnea (adult) (pediatric): Secondary | ICD-10-CM | POA: Diagnosis not present

## 2016-10-27 DIAGNOSIS — R69 Illness, unspecified: Secondary | ICD-10-CM | POA: Diagnosis not present

## 2016-11-07 DIAGNOSIS — R202 Paresthesia of skin: Secondary | ICD-10-CM | POA: Diagnosis not present

## 2016-11-07 DIAGNOSIS — N400 Enlarged prostate without lower urinary tract symptoms: Secondary | ICD-10-CM | POA: Diagnosis not present

## 2016-11-07 DIAGNOSIS — E785 Hyperlipidemia, unspecified: Secondary | ICD-10-CM | POA: Diagnosis not present

## 2016-11-07 DIAGNOSIS — N529 Male erectile dysfunction, unspecified: Secondary | ICD-10-CM | POA: Diagnosis not present

## 2016-11-07 DIAGNOSIS — Z7189 Other specified counseling: Secondary | ICD-10-CM | POA: Diagnosis not present

## 2016-11-07 DIAGNOSIS — I10 Essential (primary) hypertension: Secondary | ICD-10-CM | POA: Diagnosis not present

## 2016-11-07 DIAGNOSIS — Z23 Encounter for immunization: Secondary | ICD-10-CM | POA: Diagnosis not present

## 2016-11-07 DIAGNOSIS — Z1389 Encounter for screening for other disorder: Secondary | ICD-10-CM | POA: Diagnosis not present

## 2016-11-07 DIAGNOSIS — R69 Illness, unspecified: Secondary | ICD-10-CM | POA: Diagnosis not present

## 2016-11-07 DIAGNOSIS — N183 Chronic kidney disease, stage 3 (moderate): Secondary | ICD-10-CM | POA: Diagnosis not present

## 2016-11-07 DIAGNOSIS — Z0000 Encounter for general adult medical examination without abnormal findings: Secondary | ICD-10-CM | POA: Diagnosis not present

## 2016-11-17 DIAGNOSIS — R030 Elevated blood-pressure reading, without diagnosis of hypertension: Secondary | ICD-10-CM | POA: Diagnosis not present

## 2016-11-17 DIAGNOSIS — G5603 Carpal tunnel syndrome, bilateral upper limbs: Secondary | ICD-10-CM | POA: Diagnosis not present

## 2016-11-24 DIAGNOSIS — R69 Illness, unspecified: Secondary | ICD-10-CM | POA: Diagnosis not present

## 2017-01-03 DIAGNOSIS — G5603 Carpal tunnel syndrome, bilateral upper limbs: Secondary | ICD-10-CM | POA: Diagnosis not present

## 2017-01-03 DIAGNOSIS — G5602 Carpal tunnel syndrome, left upper limb: Secondary | ICD-10-CM | POA: Diagnosis not present

## 2017-01-19 ENCOUNTER — Ambulatory Visit: Payer: Medicare HMO | Admitting: Neurology
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
## 2017-02-08 ENCOUNTER — Ambulatory Visit (INDEPENDENT_AMBULATORY_CARE_PROVIDER_SITE_OTHER): Payer: Medicare HMO | Admitting: Neurology

## 2017-02-08 ENCOUNTER — Encounter: Payer: Self-pay | Admitting: Neurology

## 2017-02-08 VITALS — BP 144/72 | HR 90 | Ht 65.0 in | Wt 162.0 lb

## 2017-02-08 DIAGNOSIS — F0390 Unspecified dementia without behavioral disturbance: Secondary | ICD-10-CM | POA: Diagnosis not present

## 2017-02-08 DIAGNOSIS — R69 Illness, unspecified: Secondary | ICD-10-CM | POA: Diagnosis not present

## 2017-02-09 ENCOUNTER — Ambulatory Visit: Payer: Medicare HMO | Admitting: Neurology

## 2017-02-13 DIAGNOSIS — R7309 Other abnormal glucose: Secondary | ICD-10-CM | POA: Diagnosis not present

## 2017-02-13 DIAGNOSIS — N529 Male erectile dysfunction, unspecified: Secondary | ICD-10-CM | POA: Diagnosis not present

## 2017-02-13 DIAGNOSIS — E559 Vitamin D deficiency, unspecified: Secondary | ICD-10-CM | POA: Diagnosis not present

## 2017-02-13 DIAGNOSIS — I10 Essential (primary) hypertension: Secondary | ICD-10-CM | POA: Diagnosis not present

## 2017-02-13 DIAGNOSIS — N400 Enlarged prostate without lower urinary tract symptoms: Secondary | ICD-10-CM | POA: Diagnosis not present

## 2017-02-13 DIAGNOSIS — D509 Iron deficiency anemia, unspecified: Secondary | ICD-10-CM | POA: Diagnosis not present

## 2017-02-13 DIAGNOSIS — R69 Illness, unspecified: Secondary | ICD-10-CM | POA: Diagnosis not present

## 2017-02-16 DIAGNOSIS — R69 Illness, unspecified: Secondary | ICD-10-CM | POA: Diagnosis not present

## 2017-02-22 DIAGNOSIS — D509 Iron deficiency anemia, unspecified: Secondary | ICD-10-CM | POA: Diagnosis not present

## 2017-02-22 DIAGNOSIS — E785 Hyperlipidemia, unspecified: Secondary | ICD-10-CM | POA: Diagnosis not present

## 2017-02-22 DIAGNOSIS — N183 Chronic kidney disease, stage 3 (moderate): Secondary | ICD-10-CM | POA: Diagnosis not present

## 2017-02-22 DIAGNOSIS — I10 Essential (primary) hypertension: Secondary | ICD-10-CM | POA: Diagnosis not present

## 2017-02-22 DIAGNOSIS — R69 Illness, unspecified: Secondary | ICD-10-CM | POA: Diagnosis not present

## 2017-02-22 DIAGNOSIS — N400 Enlarged prostate without lower urinary tract symptoms: Secondary | ICD-10-CM | POA: Diagnosis not present

## 2017-03-01 DIAGNOSIS — R69 Illness, unspecified: Secondary | ICD-10-CM | POA: Diagnosis not present

## 2017-03-05 DIAGNOSIS — H903 Sensorineural hearing loss, bilateral: Secondary | ICD-10-CM | POA: Diagnosis not present

## 2017-03-12 DIAGNOSIS — R69 Illness, unspecified: Secondary | ICD-10-CM | POA: Diagnosis not present

## 2017-03-13 DIAGNOSIS — M25531 Pain in right wrist: Secondary | ICD-10-CM | POA: Diagnosis not present

## 2017-03-16 DIAGNOSIS — E785 Hyperlipidemia, unspecified: Secondary | ICD-10-CM | POA: Diagnosis not present

## 2017-03-16 DIAGNOSIS — D509 Iron deficiency anemia, unspecified: Secondary | ICD-10-CM | POA: Diagnosis not present

## 2017-03-16 DIAGNOSIS — N400 Enlarged prostate without lower urinary tract symptoms: Secondary | ICD-10-CM | POA: Diagnosis not present

## 2017-03-16 DIAGNOSIS — R69 Illness, unspecified: Secondary | ICD-10-CM | POA: Diagnosis not present

## 2017-03-16 DIAGNOSIS — N183 Chronic kidney disease, stage 3 (moderate): Secondary | ICD-10-CM | POA: Diagnosis not present

## 2017-03-16 DIAGNOSIS — I10 Essential (primary) hypertension: Secondary | ICD-10-CM | POA: Diagnosis not present

## 2017-03-20 DIAGNOSIS — G4733 Obstructive sleep apnea (adult) (pediatric): Secondary | ICD-10-CM | POA: Diagnosis not present

## 2017-03-23 DIAGNOSIS — H5203 Hypermetropia, bilateral: Secondary | ICD-10-CM | POA: Diagnosis not present

## 2017-03-23 DIAGNOSIS — H52223 Regular astigmatism, bilateral: Secondary | ICD-10-CM | POA: Diagnosis not present

## 2017-03-23 DIAGNOSIS — H524 Presbyopia: Secondary | ICD-10-CM | POA: Diagnosis not present

## 2017-04-04 DIAGNOSIS — I10 Essential (primary) hypertension: Secondary | ICD-10-CM | POA: Diagnosis not present

## 2017-04-04 DIAGNOSIS — N400 Enlarged prostate without lower urinary tract symptoms: Secondary | ICD-10-CM | POA: Diagnosis not present

## 2017-04-04 DIAGNOSIS — D509 Iron deficiency anemia, unspecified: Secondary | ICD-10-CM | POA: Diagnosis not present

## 2017-04-04 DIAGNOSIS — E785 Hyperlipidemia, unspecified: Secondary | ICD-10-CM | POA: Diagnosis not present

## 2017-04-04 DIAGNOSIS — R69 Illness, unspecified: Secondary | ICD-10-CM | POA: Diagnosis not present

## 2017-04-04 DIAGNOSIS — N183 Chronic kidney disease, stage 3 (moderate): Secondary | ICD-10-CM | POA: Diagnosis not present

## 2017-04-25 DIAGNOSIS — N400 Enlarged prostate without lower urinary tract symptoms: Secondary | ICD-10-CM | POA: Diagnosis not present

## 2017-04-25 DIAGNOSIS — E119 Type 2 diabetes mellitus without complications: Secondary | ICD-10-CM | POA: Diagnosis not present

## 2017-04-25 DIAGNOSIS — N529 Male erectile dysfunction, unspecified: Secondary | ICD-10-CM | POA: Diagnosis not present

## 2017-04-25 DIAGNOSIS — H04129 Dry eye syndrome of unspecified lacrimal gland: Secondary | ICD-10-CM | POA: Diagnosis not present

## 2017-04-25 DIAGNOSIS — I10 Essential (primary) hypertension: Secondary | ICD-10-CM | POA: Diagnosis not present

## 2017-04-25 DIAGNOSIS — Z833 Family history of diabetes mellitus: Secondary | ICD-10-CM | POA: Diagnosis not present

## 2017-04-25 DIAGNOSIS — R52 Pain, unspecified: Secondary | ICD-10-CM | POA: Diagnosis not present

## 2017-04-25 DIAGNOSIS — Z7984 Long term (current) use of oral hypoglycemic drugs: Secondary | ICD-10-CM | POA: Diagnosis not present

## 2017-04-25 DIAGNOSIS — Z809 Family history of malignant neoplasm, unspecified: Secondary | ICD-10-CM | POA: Diagnosis not present

## 2017-04-25 DIAGNOSIS — R69 Illness, unspecified: Secondary | ICD-10-CM | POA: Diagnosis not present

## 2017-05-09 DIAGNOSIS — N183 Chronic kidney disease, stage 3 (moderate): Secondary | ICD-10-CM | POA: Diagnosis not present

## 2017-05-09 DIAGNOSIS — R2681 Unsteadiness on feet: Secondary | ICD-10-CM | POA: Diagnosis not present

## 2017-05-09 DIAGNOSIS — I10 Essential (primary) hypertension: Secondary | ICD-10-CM | POA: Diagnosis not present

## 2017-05-09 DIAGNOSIS — E1122 Type 2 diabetes mellitus with diabetic chronic kidney disease: Secondary | ICD-10-CM | POA: Diagnosis not present

## 2017-05-18 DIAGNOSIS — G5603 Carpal tunnel syndrome, bilateral upper limbs: Secondary | ICD-10-CM | POA: Diagnosis not present

## 2017-05-18 DIAGNOSIS — Z9889 Other specified postprocedural states: Secondary | ICD-10-CM | POA: Diagnosis not present

## 2017-05-23 DIAGNOSIS — E876 Hypokalemia: Secondary | ICD-10-CM | POA: Diagnosis not present

## 2017-05-23 DIAGNOSIS — R69 Illness, unspecified: Secondary | ICD-10-CM | POA: Diagnosis not present

## 2017-08-06 DIAGNOSIS — R69 Illness, unspecified: Secondary | ICD-10-CM | POA: Diagnosis not present

## 2017-08-11 DIAGNOSIS — R69 Illness, unspecified: Secondary | ICD-10-CM | POA: Diagnosis not present

## 2017-08-19 ENCOUNTER — Encounter (HOSPITAL_COMMUNITY): Payer: Self-pay | Admitting: Nurse Practitioner

## 2017-08-19 ENCOUNTER — Emergency Department (HOSPITAL_COMMUNITY): Payer: Medicare HMO

## 2017-08-19 ENCOUNTER — Emergency Department (HOSPITAL_COMMUNITY)
Admission: EM | Admit: 2017-08-19 | Discharge: 2017-08-19 | Disposition: A | Discharge status: Home / Self Care | Payer: Medicare HMO | Attending: Emergency Medicine | Admitting: Emergency Medicine

## 2017-08-19 DIAGNOSIS — Y9301 Activity, walking, marching and hiking: Secondary | ICD-10-CM | POA: Diagnosis not present

## 2017-08-19 DIAGNOSIS — Z85828 Personal history of other malignant neoplasm of skin: Secondary | ICD-10-CM | POA: Diagnosis not present

## 2017-08-19 DIAGNOSIS — S0181XA Laceration without foreign body of other part of head, initial encounter: Secondary | ICD-10-CM

## 2017-08-19 DIAGNOSIS — Z7984 Long term (current) use of oral hypoglycemic drugs: Secondary | ICD-10-CM | POA: Insufficient documentation

## 2017-08-19 DIAGNOSIS — Z23 Encounter for immunization: Secondary | ICD-10-CM | POA: Insufficient documentation

## 2017-08-19 DIAGNOSIS — Y929 Unspecified place or not applicable: Secondary | ICD-10-CM | POA: Diagnosis not present

## 2017-08-19 DIAGNOSIS — Z79899 Other long term (current) drug therapy: Secondary | ICD-10-CM | POA: Diagnosis not present

## 2017-08-19 DIAGNOSIS — Y999 Unspecified external cause status: Secondary | ICD-10-CM | POA: Diagnosis not present

## 2017-08-19 DIAGNOSIS — S01511A Laceration without foreign body of lip, initial encounter: Secondary | ICD-10-CM | POA: Insufficient documentation

## 2017-08-19 DIAGNOSIS — W19XXXA Unspecified fall, initial encounter: Secondary | ICD-10-CM

## 2017-08-19 DIAGNOSIS — W010XXA Fall on same level from slipping, tripping and stumbling without subsequent striking against object, initial encounter: Secondary | ICD-10-CM | POA: Insufficient documentation

## 2017-08-19 MED ORDER — LIDOCAINE-EPINEPHRINE (PF) 2 %-1:200000 IJ SOLN
10.0000 mL | Freq: Once | INTRAMUSCULAR | Status: AC
  Administered 2017-08-19: 10 mL
  Filled 2017-08-19: qty 20

## 2017-08-19 MED ORDER — BACITRACIN ZINC 500 UNIT/GM EX OINT
TOPICAL_OINTMENT | CUTANEOUS | Status: AC
  Filled 2017-08-19: qty 0.9

## 2017-08-19 MED ORDER — TETANUS-DIPHTH-ACELL PERTUSSIS 5-2.5-18.5 LF-MCG/0.5 IM SUSP
0.5000 mL | Freq: Once | INTRAMUSCULAR | Status: AC
  Administered 2017-08-19: 0.5 mL via INTRAMUSCULAR
  Filled 2017-08-19: qty 0.5

## 2017-08-19 NOTE — ED Triage Notes (Signed)
Pt reports what sounds like a mechanical fall, states he tripped over rocks/gravel while doing doing garden work/weeding. Has upper lip laceration and elbow laceration both with controlled bleed, reports nasal pain too.

## 2017-08-19 NOTE — ED Provider Notes (Signed)
[facility] DEPT Provider Note   CSN: [id number] Arrival date & time: 08/19/17  1450     History   Chief Complaint Chief Complaint  Patient presents with  . Fall  . Facial Injury    HPI XAN INGRAHAM is a 77 y.o. male.  The history is provided by the patient.  Fall  This is a new problem. The current episode started 1 to 2 hours ago. The problem occurs constantly. The problem has not changed since onset.Pertinent negatives include no abdominal pain, no headaches and no shortness of breath. Associated symptoms comments: Walking today and tripped in the gravel and fell forward landing on his face.  No LOC and only complaint is of facial pain.  No neck pain and no anticoagulation.  Was able to walk after fall without leg pain.  Laceration of the right upper lip and skin tear to the arm. Nothing aggravates the symptoms. Nothing relieves the symptoms. He has tried nothing for the symptoms. The treatment provided no relief.  Facial Injury  Associated symptoms: no headaches     Past Medical History:  Diagnosis Date  . Anemia    hx  . 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 Chronic back pain   . Depression   . GERD (gastroesophageal reflux disease)   . Hyperlipidemia    "borderline"    Patient Active Problem List   Diagnosis Date Noted  . Hyperlipidemia   . HNP (herniated nucleus pulposus), lumbar 12/26/2012    Past Surgical History:  Procedure Laterality Date  . BACK SURGERY    . CARPAL TUNNEL RELEASE Bilateral   . LUMBAR LAMINECTOMY/DECOMPRESSION MICRODISCECTOMY  09/06/2011   Procedure: LUMBAR LAMINECTOMY/DECOMPRESSION MICRODISCECTOMY 1 LEVEL;  Surgeon: Hosie Spangle, MD;  Location: [facility];  Service: Neurosurgery;  Laterality: Left;  Left lumbar three-four Lumbar Laminotomy/microdiskectomy  . LUMBAR LAMINECTOMY/DECOMPRESSION MICRODISCECTOMY Left 12/26/2012   Procedure: LUMBAR  LAMINECTOMY/DECOMPRESSION MICRODISCECTOMY LEFT LUMBAR THREE-FOUR ;  Surgeon: Hosie Spangle, MD;  Location: [facility];  Service: Neurosurgery;  Laterality: Left;  . POLYPECTOMY     removed small intestine  . ROTATOR CUFF REPAIR Left   . TONSILLECTOMY          Home Medications    Prior to Admission medications   Medication Sig Start Date End Date Taking? Authorizing Provider  cholecalciferol (VITAMIN D) 1000 units tablet Take 1,000 Units by mouth daily.   Yes [provider]  DULoxetine (CYMBALTA) 30 MG capsule Take 30 mg by mouth daily.   Yes [provider]  famotidine (PEPCID) 10 MG tablet Take 10 mg by mouth 2 (two) times daily.   Yes [provider]  ferrous sulfate 325 (65 FE) MG tablet Take 325 mg by mouth daily with breakfast.   Yes [provider]  hydrochlorothiazide (HYDRODIURIL) 25 MG tablet Take 25 mg by mouth daily.   Yes [provider]  metFORMIN (GLUCOPHAGE-XR) 500 MG 24 hr tablet TAKE 1 TABLET BY MOUTH ONCE DAILY WITH EVENING MEAL 06/11/17  Yes [provider]  potassium chloride SA (K-DUR,KLOR-CON) 20 MEQ tablet TAKE 1 TABLET BY MOUTH ONCE DAILY WITH FOOD 07/30/17  Yes [provider]  sildenafil (REVATIO) 20 MG tablet Take 20 mg by mouth 3 (three) times daily.    [provider]  Tamsulosin HCl (FLOMAX) 0.4 MG CAPS Take 1 capsule (0.4 mg total) by mouth daily after breakfast. Patient not taking: Reported on  08/19/2017 09/08/11   Kary Kos, MD  terbinafine (LAMISIL) 1 % cream Apply 1 application topically 2 (two) times daily as needed (itching).     [provider]    Family History Family History  Problem Relation Age of Onset  . Colon cancer Mother   . COPD Father   . Diabetes Mellitus I Brother   . Hypertension Daughter     Social History Social History   Tobacco Use  . Smoking status: Never Smoker  . Smokeless tobacco: Never Used  Substance Use Topics  . Alcohol use: No    . Drug use: No     Allergies   Patient has no known allergies.   Review of Systems Review of Systems  Respiratory: Negative for shortness of breath.   Gastrointestinal: Negative for abdominal pain.  Neurological: Negative for headaches.  All other systems reviewed and are negative.    Physical Exam Updated Vital Signs BP (!) 169/92   Pulse 62   Temp 98.5 F (36.9 C) (Oral)   Resp 18   SpO2 99%   Physical Exam  Constitutional: He is oriented to person, place, and time. He appears well-developed and well-nourished. No distress.  HENT:  Head: Normocephalic and atraumatic.  Mouth/Throat: Oropharynx is clear and moist.    No dental injury  Eyes: Pupils are equal, round, and reactive to light. Conjunctivae and EOM are normal.  Neck: Normal range of motion. Neck supple.  Cardiovascular: Normal rate, regular rhythm and intact distal pulses.  No murmur heard. Pulmonary/Chest: Effort normal and breath sounds normal. No respiratory distress. He has no wheezes. He has no rales.  Abdominal: Soft. He exhibits no distension. There is no tenderness. There is no rebound and no guarding.  Musculoskeletal: Normal range of motion. He exhibits no edema or tenderness.       Left forearm: He exhibits no tenderness and no bony tenderness.       Arms: Neurological: He is alert and oriented to person, place, and time.  Skin: Skin is warm and dry. Capillary refill takes less than 2 seconds. No rash noted. No erythema.  Psychiatric: He has a normal mood and affect. His behavior is normal.  Nursing note and vitals reviewed.    ED Treatments / Results  Labs (all labs ordered are listed, but only abnormal results are displayed) Labs Reviewed - No data to display  EKG None  Radiology Ct Maxillofacial Wo Contrast  Result Date: 08/19/2017 CLINICAL DATA:  Laceration to upper lip.  Fall.  Pain. EXAM: CT MAXILLOFACIAL WITHOUT CONTRAST TECHNIQUE: Multidetector CT imaging of the maxillofacial  structures was performed. Multiplanar CT image reconstructions were also generated. COMPARISON:  None. FINDINGS: Osseous: No fracture or mandibular dislocation. No destructive process. Orbits: Negative. No traumatic or inflammatory finding. Sinuses: No fractures associated with the paranasal sinuses are identified. Paranasal sinuses are clear other than minimal mucosal thickening in the maxillary sinuses. Mastoid air cells and middle ears are well aerated. Soft tissues: 2 foci of air seen in the soft tissues anterior to the right maxilla on series 5, image 35. More extensive air extends from anterior to the right maxilla into the right side of the nose on series 5, images 38-49. This may be associated with the patient's reported laceration as no adjacent fracture is seen. No foreign body is noted in this region. The globes are intact. No other soft tissue abnormalities. Limited intracranial: No significant or unexpected finding. IMPRESSION: 1. There is air in the soft tissues anterior  to the right maxilla extending into the right side of the nose. This may be due to the laceration. No identified fracture. Electronically Signed   By: Dorise Bullion III M.D   On: 08/19/2017 17:11    Procedures Procedures (including critical care time)  LACERATION REPAIR Performed by: [employer] Authorized by: Blanchie Dessert Consent: Verbal consent obtained. Risks and benefits: risks, benefits and alternatives were discussed Consent given by: patient Patient identity confirmed: provided demographic data Prepped and Draped in normal sterile fashion Wound explored  Laceration Location: right upper lip  Laceration Length: 3cm  No Foreign Bodies seen or palpated  Anesthesia: local infiltration  Local anesthetic: lidocaine 2% with epinephrine  Anesthetic total: 3 ml  Irrigation method: syringe Amount of cleaning: standard  Skin closure: 6.0 vicryl  Number of sutures: 4  Technique: simple  interrupted and horizontal mattress  Patient tolerance: Patient tolerated the procedure well with no immediate complications.   Medications Ordered in ED Medications  lidocaine-EPINEPHrine (XYLOCAINE W/EPI) 2 %-1:200000 (PF) injection 10 mL (has no administration in time range)  Tdap (BOOSTRIX) injection 0.5 mL (has no administration in time range)     Initial Impression / Assessment and Plan / ED Course  I have reviewed the triage vital signs and the nursing notes.  Pertinent labs & imaging results that were available during my care of the patient were reviewed by me and considered in my medical decision making (see chart for details).     Elderly male with a mechanical fall who is not anticoagulated who is presenting today with laceration of the right upper lip.  He had no loss of consciousness.  He otherwise is doing well and has no other complaints.  He has no dental injury and lip was repaired as above.  Tetanus shot was updated.  Patient was able to ambulate without difficulty.  Final Clinical Impressions(s) / ED Diagnoses   Final diagnoses:  Fall, initial encounter  Facial laceration, initial encounter    ED Discharge Orders    None       Blanchie Dessert, MD 08/19/17 1743

## 2017-08-19 NOTE — Discharge Instructions (Signed)
Your sutures are dissolvable so you can pull them in 5 to 7 days and when they are ready to come out you should be able to just pull them right out.  The area can get wet but do not scrub over the stitches and do not shave where they are at.

## 2017-08-20 DIAGNOSIS — G4733 Obstructive sleep apnea (adult) (pediatric): Secondary | ICD-10-CM | POA: Diagnosis not present

## 2017-11-07 ENCOUNTER — Encounter: Payer: Self-pay | Admitting: Physician Assistant

## 2017-11-07 ENCOUNTER — Ambulatory Visit: Payer: Medicare HMO | Admitting: Physician Assistant

## 2017-11-07 DIAGNOSIS — R69 Illness, unspecified: Secondary | ICD-10-CM | POA: Diagnosis not present

## 2017-11-07 DIAGNOSIS — F331 Major depressive disorder, recurrent, moderate: Secondary | ICD-10-CM

## 2017-11-07 MED ORDER — DULOXETINE HCL 20 MG PO CPEP: 20.0000 mg | ORAL_CAPSULE | Freq: Every day | ORAL | 1 refills | Status: DC

## 2017-11-07 NOTE — Progress Notes (Signed)
[facility]  Patient ID: NHIA HEAPHY,  MRN: [id number]  PCP: Leeroy Cha, MD  Date of Evaluation: 11/07/2017 Time spent:15 minutes   HISTORY/CURRENT STATUS: HPI patient presents for a routine medication checkup.  He takes Cymbalta for depression.  He has been on the current dose since March and has been doing well.  He denies decreased energy or motivation.  He is able to enjoy things.  No isolating.  He sleeps well but not too much.  "I am doing well for an old man.  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 " It is a full day's work just doing that"  [place] History/ Review of Systems: Changes? :No  Allergies: Patient has no known allergies.  Current Medications:  Current Outpatient Medications:  .  cholecalciferol (VITAMIN D) 1000 units tablet, Take 1,000 Units by mouth daily., Disp: , Rfl:  .  DULoxetine (CYMBALTA) 20 MG capsule, Take 1 capsule (20 mg total) by mouth daily., Disp: 90 capsule, Rfl: 1 .  famotidine (PEPCID) 10 MG tablet, Take 10 mg by mouth 2 (two) times daily., Disp: , Rfl:  .  ferrous sulfate 325 (65 FE) MG tablet, Take 325 mg by mouth daily with breakfast., Disp: , Rfl:  .  hydrochlorothiazide (HYDRODIURIL) 25 MG tablet, Take 25 mg by mouth daily., Disp: , Rfl:  .  metFORMIN (GLUCOPHAGE-XR) 500 MG 24 hr tablet, TAKE 1 TABLET BY MOUTH ONCE DAILY WITH EVENING MEAL, Disp: , Rfl: 2 .  potassium chloride SA (K-DUR,KLOR-CON) 20 MEQ tablet, TAKE 1 TABLET BY MOUTH ONCE DAILY WITH FOOD, Disp: , Rfl: 3 .  sildenafil (REVATIO) 20 MG tablet, Take 20 mg by mouth 3 (three) times daily., Disp: , Rfl:  .  Tamsulosin HCl (FLOMAX) 0.4 MG CAPS, Take 1 capsule (0.4 mg total) by mouth daily after breakfast., Disp:  30 capsule, Rfl: 0 .  terbinafine (LAMISIL) 1 % cream, Apply 1 application topically 2 (two) times daily as needed (itching). , Disp: , Rfl:  Medication Side Effects: None  Family Medical/ Social History: Changes? No  MENTAL HEALTH EXAM:  There were no vitals taken for this visit.There is no height or weight on file to calculate BMI.  General Appearance: Well Groomed  Eye Contact:  Good  Speech:  Clear and Coherent  Volume:  Normal  Mood:  Euthymic  Affect:  Appropriate  Thought Process:  Goal Directed  Orientation:  Full (Time, Place, and Person)  Thought Content: Logical   Suicidal Thoughts:  No  Homicidal Thoughts:  No  Memory:  Immediate  Judgement:  Good  Insight:  Good  Psychomotor Activity:  Normal  Concentration:  Concentration: Good  Recall:  Good  Fund of Knowledge: Good  Language: Good  Akathisia:  No  AIMS (if indicated): not done  Assets:  Communication Skills  ADL's:  Intact  Cognition: WNL  Prognosis:  Good    DIAGNOSES:    ICD-10-CM   1. Major depressive disorder, recurrent episode, moderate (HCC) F33.1     RECOMMENDATIONS: Continue Cymbalta 20 mg daily. Return in 6 months or sooner as needed   [name], PA-C

## 2017-11-22 DIAGNOSIS — Z1389 Encounter for screening for other disorder: Secondary | ICD-10-CM | POA: Diagnosis not present

## 2017-11-22 DIAGNOSIS — E1122 Type 2 diabetes mellitus with diabetic chronic kidney disease: Secondary | ICD-10-CM | POA: Diagnosis not present

## 2017-11-22 DIAGNOSIS — Z0000 Encounter for general adult medical examination without abnormal findings: Secondary | ICD-10-CM | POA: Diagnosis not present

## 2017-11-22 DIAGNOSIS — N183 Chronic kidney disease, stage 3 (moderate): Secondary | ICD-10-CM | POA: Diagnosis not present

## 2017-11-22 DIAGNOSIS — I10 Essential (primary) hypertension: Secondary | ICD-10-CM | POA: Diagnosis not present

## 2017-11-22 DIAGNOSIS — Z23 Encounter for immunization: Secondary | ICD-10-CM | POA: Diagnosis not present

## 2017-11-22 DIAGNOSIS — N400 Enlarged prostate without lower urinary tract symptoms: Secondary | ICD-10-CM | POA: Diagnosis not present

## 2017-11-22 DIAGNOSIS — R269 Unspecified abnormalities of gait and mobility: Secondary | ICD-10-CM | POA: Diagnosis not present

## 2017-11-22 DIAGNOSIS — R69 Illness, unspecified: Secondary | ICD-10-CM | POA: Diagnosis not present

## 2017-12-02 DIAGNOSIS — A084 Viral intestinal infection, unspecified: Secondary | ICD-10-CM | POA: Diagnosis not present

## 2017-12-18 DIAGNOSIS — G4733 Obstructive sleep apnea (adult) (pediatric): Secondary | ICD-10-CM | POA: Diagnosis not present

## 2018-01-06 ENCOUNTER — Encounter: Payer: Self-pay | Admitting: Emergency Medicine

## 2018-01-06 DIAGNOSIS — F331 Major depressive disorder, recurrent, moderate: Secondary | ICD-10-CM | POA: Insufficient documentation

## 2018-01-21 ENCOUNTER — Ambulatory Visit: Payer: Medicare HMO | Admitting: Physician Assistant

## 2018-01-21 ENCOUNTER — Encounter: Payer: Self-pay | Admitting: Physician Assistant

## 2018-01-21 DIAGNOSIS — R69 Illness, unspecified: Secondary | ICD-10-CM | POA: Diagnosis not present

## 2018-01-21 DIAGNOSIS — F331 Major depressive disorder, recurrent, moderate: Secondary | ICD-10-CM | POA: Diagnosis not present

## 2018-01-21 NOTE — Progress Notes (Signed)
[facility]  Patient ID: Antonio [name],  MRN: [id number]  PCP: Leeroy Cha, MD  Date of Evaluation: 01/21/2018 Time spent:15 minutes  Chief Complaint:  Chief Complaint    Follow-up      HISTORY/CURRENT STATUS: HPI Here for f/u.  Was here 2 months ago. Next appt was for 6 months.  We think this was an appt that wasn't cancelled in the past.  He's had a little bit more irritability around Christmas time.  He states this is happened every year for the past couple of years.  He gets aggravated at the money being spent and has a hard time getting in the Christmas spirit.  He already feels better now that Christmas is over.  Denies any anxiety.  He does complain of feeling tired in the mornings.  He stays up until around 1:30 or 2 and sleeps just fine, using a CPAP.  He does not wake up often.  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  Patient denies loss of interest in usual activities and is able to enjoy things.  Denies decreased energy or motivation.  Appetite has not changed.  No extreme sadness, tearfulness, or feelings of hopelessness.  Denies any changes in concentration, making decisions or remembering things.  Denies suicidal or homicidal thoughts.  Individual Medical History/ Review of Systems: Changes? :No   Allergies: Patient has no known allergies.  Current Medications:  Current Outpatient Medications:  .  cholecalciferol (VITAMIN D) 1000 units tablet, Take 1,000 Units by mouth daily., Disp: , Rfl:  .  DULoxetine (CYMBALTA) 20 MG capsule, Take 1 capsule (20 mg total) by mouth daily., Disp: 90 capsule, Rfl: 1 .  famotidine (PEPCID) 10 MG tablet, Take 10 mg by mouth 2 (two) times daily., Disp: , Rfl:  .  ferrous sulfate 325 (65 FE) MG tablet, Take 325 mg by mouth daily with breakfast., Disp: , Rfl:  .  hydrochlorothiazide (HYDRODIURIL) 25 MG tablet, Take 25 mg by mouth daily.,  Disp: , Rfl:  .  metFORMIN (GLUCOPHAGE-XR) 500 MG 24 hr tablet, TAKE 1 TABLET BY MOUTH ONCE DAILY WITH EVENING MEAL, Disp: , Rfl: 2 .  potassium chloride SA (K-DUR,KLOR-CON) 20 MEQ tablet, TAKE 1 TABLET BY MOUTH ONCE DAILY WITH FOOD, Disp: , Rfl: 3 .  sildenafil (REVATIO) 20 MG tablet, Take 20 mg by mouth 3 (three) times daily., Disp: , Rfl:  .  Tamsulosin HCl (FLOMAX) 0.4 MG CAPS, Take 1 capsule (0.4 mg total) by mouth daily after breakfast., Disp: 30 capsule, Rfl: 0 .  terbinafine (LAMISIL) 1 % cream, Apply 1 application topically 2 (two) times daily as needed (itching). , Disp: , Rfl:  Medication Side Effects: none  Family Medical/ Social History: Changes? No  MENTAL HEALTH EXAM:  There were no vitals taken for this visit.There is no height or weight on file to calculate BMI.  General Appearance: Casual  Eye Contact:  Good  Speech:  Clear and Coherent  Volume:  Normal  Mood:  Euthymic  Affect:  Appropriate  Thought Process:  Goal Directed  Orientation:  Full (Time, Place, and Person)  Thought Content: Logical   Suicidal Thoughts:  No  Homicidal Thoughts:  No  Memory:  WNL  Judgement:  Good  Insight:  Good  Psychomotor Activity:  Normal  Concentration:  Concentration: Good  Recall:  Good  Fund of Knowledge: Good  Language: Good  Assets:  Desire  for Improvement  ADL's:  Intact  Cognition: WNL  Prognosis:  Good    DIAGNOSES:    ICD-10-CM   1. Major depressive disorder, recurrent episode, moderate (HCC) F33.1     Receiving Psychotherapy: No    RECOMMENDATIONS: We discussed the irritability.  I believe its related to memories of past Christmases and the stress involved.  However if the irritability recurs, we may need to add a mood stabilizer or increase the Cymbalta.  I also recommend that around Thanksgiving of this next year, he come in and we will decide if we need to add another medication in at that point.  Also consider psychotherapy at any point in time. Return in  6 months.   [name], PA-C

## 2018-05-09 ENCOUNTER — Ambulatory Visit: Payer: Medicare HMO | Admitting: Physician Assistant

## 2018-06-17 ENCOUNTER — Other Ambulatory Visit: Payer: Self-pay | Admitting: Physician Assistant

## 2018-07-16 ENCOUNTER — Ambulatory Visit: Payer: Medicare Other | Admitting: Physician Assistant

## 2018-07-16 ENCOUNTER — Encounter: Payer: Self-pay | Admitting: Physician Assistant

## 2018-07-16 ENCOUNTER — Other Ambulatory Visit: Payer: Self-pay

## 2018-07-16 DIAGNOSIS — F331 Major depressive disorder, recurrent, moderate: Secondary | ICD-10-CM

## 2018-07-16 NOTE — Progress Notes (Addendum)
[facility]  Patient ID: Antonio [name],  MRN: [id number]  PCP: Leeroy Cha, MD  Date of Evaluation: 07/16/2018 Time spent:15 minutes  Chief Complaint:  Chief Complaint    Follow-up      HISTORY/CURRENT STATUS: HPI For 6 week med check.  Patient denies loss of interest in usual activities and is able to enjoy things.  Denies decreased energy or motivation.  Appetite has not changed.  No extreme sadness, tearfulness, or feelings of hopelessness.  Denies any changes in concentration, making decisions or remembering things.  Denies suicidal or homicidal thoughts.  He's concerned about all the things going on in the world.  But other than that, he's okay.  He's sleeping well. Works in the yard a lot. 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  Denies muscle or joint pain, stiffness, or dystonia.  Individual Medical History/ Review of Systems: Changes? :Yes Complains of being dizzy a lot.  No nausea or vomiting, no neurologic deficits reported, no increased fatigue, no syncope.  Denies tremor or other abnormal movements.  Past medications for mental health diagnoses include: Wellbutrin SR Wellbutrin XL, Zoloft, Cymbalta  Allergies: Patient has no known allergies.  Current Medications:  Current Outpatient Medications:  .  cholecalciferol (VITAMIN D) 1000 units tablet, Take 1,000 Units by mouth daily., Disp: , Rfl:  .  DULoxetine (CYMBALTA) 20 MG capsule, Take 1 capsule by mouth once daily, Disp: 90 capsule, Rfl: 0 .  famotidine (PEPCID) 10 MG tablet, Take 10 mg by mouth 2 (two) times daily., Disp: , Rfl:  .  ferrous sulfate 325 (65 FE) MG tablet, Take 325 mg by mouth daily with breakfast., Disp: , Rfl:  .  hydrochlorothiazide (HYDRODIURIL) 25 MG tablet, Take 25 mg by mouth daily., Disp: , Rfl:  .  metFORMIN (GLUCOPHAGE-XR) 500 MG 24 hr tablet, TAKE 1 TABLET BY MOUTH ONCE DAILY WITH EVENING  MEAL, Disp: , Rfl: 2 .  potassium chloride SA (K-DUR,KLOR-CON) 20 MEQ tablet, TAKE 1 TABLET BY MOUTH ONCE DAILY WITH FOOD, Disp: , Rfl: 3 .  sildenafil (REVATIO) 20 MG tablet, Take 20 mg by mouth 3 (three) times daily., Disp: , Rfl:  .  Tamsulosin HCl (FLOMAX) 0.4 MG CAPS, Take 1 capsule (0.4 mg total) by mouth daily after breakfast., Disp: 30 capsule, Rfl: 0 .  terbinafine (LAMISIL) 1 % cream, Apply 1 application topically 2 (two) times daily as needed (itching). , Disp: , Rfl:    Medication Side Effects: none  Family Medical/ Social History: Changes? No  MENTAL HEALTH EXAM:  There were no vitals taken for this visit.There is no height or weight on file to calculate BMI.  General Appearance: Casual  Eye Contact:  Good  Speech:  Clear and Coherent  Volume:  Normal  Mood:  Euthymic  Affect:  Appropriate  Thought Process:  Goal Directed  Orientation:  Full (Time, Place, and Person)  Thought Content: Logical   Suicidal Thoughts:  No  Homicidal Thoughts:  No  Memory:  WNL  Judgement:  Good  Insight:  Good  Psychomotor Activity:  Normal  Concentration:  Concentration: Good  Recall:  Good  Fund of Knowledge: Good  Language: Good  Assets:  Desire for Improvement  ADL's:  Intact  Cognition: WNL  Prognosis:  Good    DIAGNOSES:    ICD-10-CM   1. Major depressive disorder, recurrent episode, moderate (HCC)  F33.1  Receiving Psychotherapy: No    RECOMMENDATIONS:  Continue Cymbalta 20 mg p.o. daily. Recommend he see his PCP as soon as possible for the dizziness. Return in 6 months.  [name], PA-C   This record has been created using [employer].  Chart creation errors have been sought, but may not always have been located and corrected. Such creation errors do not reflect on the standard of medical care.

## 2018-09-16 ENCOUNTER — Other Ambulatory Visit: Payer: Self-pay | Admitting: Physician Assistant

## 2018-11-23 ENCOUNTER — Encounter (INDEPENDENT_AMBULATORY_CARE_PROVIDER_SITE_OTHER): Payer: Self-pay

## 2019-01-07 ENCOUNTER — Other Ambulatory Visit: Payer: Self-pay

## 2019-01-07 ENCOUNTER — Ambulatory Visit (INDEPENDENT_AMBULATORY_CARE_PROVIDER_SITE_OTHER): Payer: Medicare Other | Admitting: Physician Assistant

## 2019-01-07 ENCOUNTER — Encounter: Payer: Self-pay | Admitting: Physician Assistant

## 2019-01-07 DIAGNOSIS — F331 Major depressive disorder, recurrent, moderate: Secondary | ICD-10-CM

## 2019-01-07 NOTE — Progress Notes (Signed)
[facility]  Patient ID: Antonio [name],  MRN: [id number]  PCP: Leeroy Cha, MD  Date of Evaluation: 01/07/2019 Time spent:15 minutes  Chief Complaint:  Chief Complaint    Depression; Follow-up      HISTORY/CURRENT STATUS: HPI Here for routine med check.   He's having a little bit more irritability right now.  Always gets this way around Christmas.  He gets aggravated at the money being spent and has a hard time getting in the Christmas spirit. Also he's irritable b/c of the political situation in the country.    He still complains of feeling tired in the mornings.  Just yesterday, he started feeding the dogs earlier, and went to bed earlier. He thinks that might help, but unsure.  He does not have any trouble sleeping, he can go to sleep and stay asleep.  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  He does not think he needs more than 5 to 6 hours that he gets so by going to bed sooner, he will get more sleep.  Uses a CPAP every night.   Patient denies loss of interest in usual activities and is able to enjoy things.  Denies decreased energy or motivation.  Appetite has not changed.  No extreme sadness, tearfulness, or feelings of hopelessness.  Denies any changes in concentration, making decisions or remembering things.  Denies suicidal or homicidal thoughts.  Denies muscle or joint pain, stiffness, or dystonia. Denies dizziness, syncope, seizures, numbness, tingling, tremor, tics, unsteady gait, slurred speech, confusion.   Individual Medical History/ Review of Systems: Changes? :No    Past medications for mental health diagnoses include: Wellbutrin SR Wellbutrin XL, Zoloft, Cymbalta  Allergies: Patient has no known allergies.  Current Medications:  Current Outpatient Medications:  .  cholecalciferol (VITAMIN D) 1000 units tablet, Take 1,000 Units by mouth daily., Disp: , Rfl:  .  DULoxetine (CYMBALTA) 20 MG  capsule, Take 1 capsule by mouth once daily, Disp: 90 capsule, Rfl: 1 .  famotidine (PEPCID) 10 MG tablet, Take 10 mg by mouth 2 (two) times daily., Disp: , Rfl:  .  ferrous sulfate 325 (65 FE) MG tablet, Take 325 mg by mouth daily with breakfast., Disp: , Rfl:  .  hydrochlorothiazide (HYDRODIURIL) 25 MG tablet, Take 25 mg by mouth daily., Disp: , Rfl:  .  metFORMIN (GLUCOPHAGE-XR) 500 MG 24 hr tablet, TAKE 1 TABLET BY MOUTH ONCE DAILY WITH EVENING MEAL, Disp: , Rfl: 2 .  potassium chloride SA (K-DUR,KLOR-CON) 20 MEQ tablet, TAKE 1 TABLET BY MOUTH ONCE DAILY WITH FOOD, Disp: , Rfl: 3 .  sildenafil (REVATIO) 20 MG tablet, Take 20 mg by mouth 3 (three) times daily., Disp: , Rfl:  .  Tamsulosin HCl (FLOMAX) 0.4 MG CAPS, Take 1 capsule (0.4 mg total) by mouth daily after breakfast., Disp: 30 capsule, Rfl: 0 .  terbinafine (LAMISIL) 1 % cream, Apply 1 application topically 2 (two) times daily as needed (itching). , Disp: , Rfl:  Medication Side Effects: none  Family Medical/ Social History: Changes? No  MENTAL HEALTH EXAM:  There were no vitals taken for this visit.There is no height or weight on file to calculate BMI.  General Appearance: Casual, Neat, Well Groomed and Obese  Eye Contact:  Good  Speech:  Clear and Coherent  Volume:  Normal  Mood:  Euthymic  Affect:  Appropriate  Thought Process:  Goal Directed and Descriptions of Associations: Intact  Orientation:  Full (Time, Place, and Person)  Thought Content: Logical   Suicidal Thoughts:  No  Homicidal Thoughts:  No  Memory:  WNL  Judgement:  Good  Insight:  Good  Psychomotor Activity:  Normal  Concentration:  Concentration: Good  Recall:  Good  Fund of Knowledge: Good  Language: Good  Assets:  Desire for Improvement  ADL's:  Intact  Cognition: WNL  Prognosis:  Good    DIAGNOSES:    ICD-10-CM   1. Major depressive disorder, recurrent episode, moderate ([place])  F33.1     Receiving Psychotherapy: No    RECOMMENDATIONS:   I am glad to see him doing well.  I believe that the irritability that he is experiencing is just due to the time of year and the political unrest in the country right now.  If this does not improve after the holidays, or gets worse at any time, call and we may need to make some medication changes. Continue Cymbalta 20 mg, 1 p.o. daily. Return in 6 months.   [name], PA-C

## 2019-04-16 ENCOUNTER — Other Ambulatory Visit: Payer: Self-pay | Admitting: Orthopedic Surgery

## 2019-04-16 DIAGNOSIS — M25512 Pain in left shoulder: Secondary | ICD-10-CM

## 2019-05-02 ENCOUNTER — Telehealth: Payer: Self-pay | Admitting: Physician Assistant

## 2019-05-02 ENCOUNTER — Ambulatory Visit
Admission: RE | Admit: 2019-05-02 | Discharge: 2019-05-02 | Disposition: A | Discharge status: Home / Self Care | Payer: Medicare Other | Source: Ambulatory Visit | Attending: Orthopedic Surgery | Admitting: Orthopedic Surgery

## 2019-05-02 ENCOUNTER — Other Ambulatory Visit: Payer: Self-pay

## 2019-05-02 DIAGNOSIS — M25512 Pain in left shoulder: Secondary | ICD-10-CM

## 2019-05-02 MED ORDER — DULOXETINE HCL 20 MG PO CPEP: 20.0000 mg | ORAL_CAPSULE | Freq: Every day | ORAL | 0 refills | Status: DC

## 2019-05-02 NOTE — Telephone Encounter (Signed)
Pt is completely out of Duloxetine. [facility] said that when they get the ok it could take up to 10 days. Pt wants to know if he can get some called in at [facility].

## 2019-05-02 NOTE — Telephone Encounter (Signed)
90 day submitted to [facility], added new pharmacy  2 week supply submitted to [facility] per request  Has follow up in June 2021

## 2019-06-24 NOTE — H&P (Signed)
Patient's anticipated LOS is less than 2 midnights, meeting these requirements: - Younger than 46 - Lives within 1 hour of care - Has a competent adult at home to recover with post-op recover - NO history of  - Chronic pain requiring opiods  - Diabetes  - Coronary Artery Disease  - Heart failure  - Heart attack  - Stroke  - DVT/VTE  - Cardiac arrhythmia  - Respiratory Failure/COPD  - Renal failure  - Anemia  - Advanced Liver disease       Antonio ZEHNER is an 79 y.o. male.    Chief Complaint: left shoulder pain  HPI: Pt is a 79 y.o. male complaining of left shoulder pain for multiple years. Pain had continually increased since the beginning. X-rays in the clinic show end-stage arthritic changes of the left shoulder. Pt has tried various conservative treatments which have failed to alleviate their symptoms, including injections and therapy. Various options are discussed with the patient. Risks, benefits and expectations were discussed with the patient. Patient understand the risks, benefits and expectations and wishes to proceed with surgery.   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   Chronic back pain    Depression    GERD (gastroesophageal reflux disease)    Hyperlipidemia    "borderline"    PSH: Past Surgical History:  Procedure Laterality Date   BACK SURGERY     CARPAL TUNNEL RELEASE Bilateral    LUMBAR LAMINECTOMY/DECOMPRESSION MICRODISCECTOMY  09/06/2011   Procedure: LUMBAR LAMINECTOMY/DECOMPRESSION MICRODISCECTOMY 1 LEVEL;  Surgeon: Hosie Spangle, MD;  Location: [facility];  Service: Neurosurgery;  Laterality: Left;  Left lumbar three-four Lumbar Laminotomy/microdiskectomy   LUMBAR LAMINECTOMY/DECOMPRESSION MICRODISCECTOMY Left 12/26/2012   Procedure: LUMBAR LAMINECTOMY/DECOMPRESSION  MICRODISCECTOMY LEFT LUMBAR THREE-FOUR ;  Surgeon: Hosie Spangle, MD;  Location: [facility];  Service: Neurosurgery;  Laterality: Left;   POLYPECTOMY     removed small intestine   ROTATOR CUFF REPAIR Left    TONSILLECTOMY      Social History:  reports that he has never smoked. He has never used smokeless tobacco. He reports that he does not drink alcohol or use drugs.  Allergies:  No Known Allergies  Medications: No current facility-administered medications for this encounter.   Current Outpatient Medications  Medication Sig Dispense Refill   cholecalciferol (VITAMIN D) 1000 units tablet Take 1,000 Units by mouth daily.     DULoxetine (CYMBALTA) 20 MG capsule Take 1 capsule (20 mg total) by mouth daily. 14 capsule 0   DULoxetine (CYMBALTA) 20 MG capsule Take 1 capsule (20 mg total) by mouth daily. 90 capsule 0   famotidine (PEPCID) 10 MG tablet Take 10 mg by mouth 2 (two) times daily.     ferrous sulfate 325 (65 FE) MG tablet Take 325 mg by mouth daily with breakfast.     hydrochlorothiazide (HYDRODIURIL) 25 MG tablet Take 25 mg by mouth daily.     metFORMIN (GLUCOPHAGE-XR) 500 MG 24 hr tablet TAKE 1 TABLET BY MOUTH ONCE DAILY WITH EVENING MEAL  2   potassium chloride SA (K-DUR,KLOR-CON) 20 MEQ tablet TAKE 1 TABLET BY MOUTH ONCE DAILY WITH FOOD  3   sildenafil (REVATIO) 20 MG tablet Take 20 mg by mouth 3 (three) times daily.     Tamsulosin HCl (FLOMAX) 0.4 MG  CAPS Take 1 capsule (0.4 mg total) by mouth daily after breakfast. 30 capsule 0   terbinafine (LAMISIL) 1 % cream Apply 1 application topically 2 (two) times daily as needed (itching).       No results found for this or any previous visit (from the past 48 hour(s)). No results found.  ROS: Pain with rom of the left upper extremity  Physical Exam: Alert and oriented 79 y.o. male in no acute distress Cranial nerves 2-12 intact Cervical spine: full rom with no tenderness, nv intact distally Chest: active  breath sounds bilaterally, no wheeze rhonchi or rales Heart: regular rate and rhythm, no murmur Abd: non tender non distended with active bowel sounds Hip is stable with rom  Left shoulder painful and weak rom nv intact distally No rashes or edema   Assessment/Plan Assessment: left shoulder cuff arthropathy  Plan:  Patient will undergo a left reverse shoulder by Dr. Veverly Fells at [facility] Risks benefits and expectations were discussed with the patient. Patient understand risks, benefits and expectations and wishes to proceed. Preoperative templating of the joint replacement has been completed, documented, and submitted to the Operating Room personnel in order to optimize intra-operative equipment management.   Merla Riches PA-C, MP[facility] is now [employer] [address], [place], [place], [place] [address] Phone: [phone] [email or web address]   [employer]

## 2019-07-03 ENCOUNTER — Encounter (HOSPITAL_COMMUNITY): Payer: Self-pay

## 2019-07-03 NOTE — Patient Instructions (Addendum)
DUE TO COVID-19 ONLY ONE VISITOR ARE ALLOWED TO COME WITH YOU AND STAY IN THE WAITING ROOM ONLY DURING PRE OP AND PROCEDURE. THEN TWO VISITORS MAY VISIT WITH YOU IN YOUR PRIVATE ROOM DURING VISITING HOURS ONLY!!   COVID SWAB TESTING MUST BE COMPLETED ON: Tuesday, July 08, 2019 at  10:15AM [address], [place] [facility] [facility] pre surgical testing line (Must self quarantine after testing. Follow instructions on handout.)             Your procedure is scheduled on: Friday, July 11, 2019   Report to [facility] Main  Entrance   Report to Short Stay at 5:30 AM   [facility])    Report to admitting at AM   Call this number if you have problems the morning of surgery [phone]   Do not eat food :After Midnight.   May have liquids until 4:30 AM day of surgery   CLEAR LIQUID DIET  Foods Allowed                                                                     Foods Excluded  Water, Black Coffee and tea, regular and decaf                             liquids that you cannot  Plain Jell-O in any flavor  (No red)                                           see through such as: Fruit ices (not with fruit pulp)                                     milk, soups, orange juice  Iced Popsicles (No red)                                    All solid food                                   Apple juices Sports drinks like Gatorade (No red) Lightly seasoned clear broth or consume(fat free) Sugar, honey syrup  Sample Menu Breakfast                                Lunch                                     Supper Cranberry juice                    Beef broth  Chicken broth Jell-O                                     Grape juice                           Apple juice Coffee or tea                        Jell-O                                      Popsicle                                                Coffee or tea                         Coffee or tea   Complete one G2 drink the morning of surgery at  4:30 AM the day of surgery.   Oral Hygiene is also important to reduce your risk of infection.                                    Remember - BRUSH YOUR TEETH THE MORNING OF SURGERY WITH YOUR REGULAR TOOTHPASTE   Do NOT smoke after Midnight   Take these medicines the morning of surgery with A SIP OF WATER: Duloxetine, Famotidine, Tamsulosin  DO NOT TAKE ANY ORAL DIABETIC MEDICATIONS DAY OF YOUR SURGERY                               You may not have any metal on your body including jewelry, and body piercings             Do not wear lotions, powders, perfumes/cologne, or deodorant                           Men may shave face and neck.   Do not bring valuables to the hospital. [name].   Contacts, dentures or bridgework may not be worn into surgery.   Bring small overnight bag day of surgery.    Patients discharged the day of surgery will not be allowed to drive home.   Special Instructions: Bring a copy of your healthcare power of attorney and living will documents         the day of surgery if you haven't scanned them in before.              Please read over the following fact sheets you were given: IF YOU HAVE QUESTIONS ABOUT YOUR PRE OP INSTRUCTIONS PLEASE CALL [place]- Preparing for Total Shoulder Arthroplasty    Before surgery, you can play an important role. Because skin is not sterile, your skin needs to be as free of germs as possible. You can reduce the number  of germs on your skin by using the following products. . Benzoyl Peroxide Gel o Reduces the number of germs present on the skin o Applied twice a day to shoulder area starting two days before surgery    ==================================================================  Please follow these instructions carefully:  BENZOYL PEROXIDE 5% GEL  Please do not use if you have an allergy  to benzoyl peroxide.   If your skin becomes reddened/irritated stop using the benzoyl peroxide.  Starting two days before surgery, apply as follows: (Wednesday and Thursday) 1. Apply benzoyl peroxide in the morning and at night. Apply after taking a shower. If you are not taking a shower clean entire shoulder front, back, and side along with the armpit with a clean wet washcloth.  2. Place a quarter-sized dollop on your shoulder and rub in thoroughly, making sure to cover the front, back, and side of your shoulder, along with the armpit.   2 days before ____ AM   ____ PM              1 day before ____ AM   ____ PM                         3. Do this twice a day for two days.  (Last application is the night before surgery, AFTER using the CHG soap as described below).  4. Do NOT apply benzoyl peroxide gel on the day of surgery.    [place] - Preparing for Surgery Before surgery, you can play an important role.  Because skin is not sterile, your skin needs to be as free of germs as possible.  You can reduce the number of germs on your skin by washing with CHG (chlorahexidine gluconate) soap before surgery.  CHG is an antiseptic cleaner which kills germs and bonds with the skin to continue killing germs even after washing. Please DO NOT use if you have an allergy to CHG or antibacterial soaps.  If your skin becomes reddened/irritated stop using the CHG and inform your nurse when you arrive at Short Stay. Do not shave (including legs and underarms) for at least 48 hours prior to the first CHG shower.  You may shave your face/neck.  Please follow these instructions carefully:  1.  Shower with CHG Soap the night before surgery and the  morning of surgery.  2.  If you choose to wash your hair, wash your hair first as usual with your normal  shampoo.  3.  After you shampoo, rinse your hair and body thoroughly to remove the shampoo.                             4.  Use CHG as you would any other  liquid soap.  You can apply chg directly to the skin and wash.  Gently with a scrungie or clean washcloth.  5.  Apply the CHG Soap to your body ONLY FROM THE NECK DOWN.   Do   not use on face/ open                           Wound or open sores. Avoid contact with eyes, ears mouth and   genitals (private parts).                       Wash face,  Genitals (private parts) with  your normal soap.             6.  Wash thoroughly, paying special attention to the area where your    surgery  will be performed.  7.  Thoroughly rinse your body with warm water from the neck down.  8.  DO NOT shower/wash with your normal soap after using and rinsing off the CHG Soap.                9.  Pat yourself dry with a clean towel.            10.  Wear clean pajamas.            11.  Place clean sheets on your bed the night of your first shower and do not  sleep with pets. Day of Surgery : Do not apply any lotions/deodorants the morning of surgery.  Please wear clean clothes to the hospital/surgery center.  FAILURE TO FOLLOW THESE INSTRUCTIONS MAY RESULT IN THE CANCELLATION OF YOUR SURGERY  PATIENT SIGNATURE_________________________________  NURSE SIGNATURE__________________________________  ________________________________________________________________________   Adam Phenix  An incentive spirometer is a tool that can help keep your lungs clear and active. This tool measures how well you are filling your lungs with each breath. Taking long deep breaths may help reverse or decrease the chance of developing breathing (pulmonary) problems (especially infection) following:  A long period of time when you are unable to move or be active. BEFORE THE PROCEDURE   If the spirometer includes an indicator to show your best effort, your nurse or respiratory therapist will set it to a desired goal.  If possible, sit up straight or lean slightly forward. Try not to slouch.  Hold the incentive spirometer in an  upright position. INSTRUCTIONS FOR USE  1. Sit on the edge of your bed if possible, or sit up as far as you can in bed or on a chair. 2. Hold the incentive spirometer in an upright position. 3. Breathe out normally. 4. Place the mouthpiece in your mouth and seal your lips tightly around it. 5. Breathe in slowly and as deeply as possible, raising the piston or the ball toward the top of the column. 6. Hold your breath for 3-5 seconds or for as long as possible. Allow the piston or ball to fall to the bottom of the column. 7. Remove the mouthpiece from your mouth and breathe out normally. 8. Rest for a few seconds and repeat Steps 1 through 7 at least 10 times every 1-2 hours when you are awake. Take your time and take a few normal breaths between deep breaths. 9. The spirometer may include an indicator to show your best effort. Use the indicator as a goal to work toward during each repetition. 10. After each set of 10 deep breaths, practice coughing to be sure your lungs are clear. If you have an incision (the cut made at the time of surgery), support your incision when coughing by placing a pillow or rolled up towels firmly against it. Once you are able to get out of bed, walk around indoors and cough well. You may stop using the incentive spirometer when instructed by your caregiver.  RISKS AND COMPLICATIONS  Take your time so you do not get dizzy or light-headed.  If you are in pain, you may need to take or ask for pain medication before doing incentive spirometry. It is harder to take a deep breath if you are having pain. AFTER USE  Rest  and breathe slowly and easily.  It can be helpful to keep track of a log of your progress. Your caregiver can provide you with a simple table to help with this. If you are using the spirometer at home, follow these instructions: [place] IF:   You are having difficultly using the spirometer.  You have trouble using the spirometer as often as  instructed.  Your pain medication is not giving enough relief while using the spirometer.  You develop fever of 100.5 F (38.1 C) or higher. SEEK IMMEDIATE MEDICAL CARE IF:   You cough up bloody sputum that had not been present before.  You develop fever of 102 F (38.9 C) or greater.  You develop worsening pain at or near the incision site. MAKE SURE YOU:   Understand these instructions.  Will watch your condition.  Will get help right away if you are not doing well or get worse. Document Released: 05/22/2006 Document Revised: 04/03/2011 Document Reviewed: 07/23/2006 [facility] Patient Information 2014 [place], [place].   ________________________________________________________________________

## 2019-07-04 ENCOUNTER — Encounter (HOSPITAL_COMMUNITY)
Admission: RE | Admit: 2019-07-04 | Discharge: 2019-07-04 | Disposition: A | Discharge status: Home / Self Care | Payer: Medicare Other | Source: Ambulatory Visit | Attending: Orthopedic Surgery | Admitting: Orthopedic Surgery

## 2019-07-04 ENCOUNTER — Other Ambulatory Visit: Payer: Self-pay

## 2019-07-04 ENCOUNTER — Encounter (HOSPITAL_COMMUNITY): Payer: Self-pay

## 2019-07-04 DIAGNOSIS — Z01818 Encounter for other preprocedural examination: Secondary | ICD-10-CM | POA: Diagnosis not present

## 2019-07-04 HISTORY — DX: Obstructive sleep apnea (adult) (pediatric): Z99.89

## 2019-07-04 HISTORY — DX: Personal history of other diseases of the nervous system and sense organs: Z86.69

## 2019-07-04 HISTORY — DX: Localized edema: R60.0

## 2019-07-04 HISTORY — DX: Benign prostatic hyperplasia without lower urinary tract symptoms: N40.0

## 2019-07-04 HISTORY — DX: Obstructive sleep apnea (adult) (pediatric): G47.33

## 2019-07-04 HISTORY — DX: Type 2 diabetes mellitus without complications: E11.9

## 2019-07-04 LAB — CBC
HCT: 40.5 % (ref 39.0–52.0)
Hemoglobin: 12.9 g/dL — ABNORMAL LOW (ref 13.0–17.0)
MCH: 30.4 pg (ref 26.0–34.0)
MCHC: 31.9 g/dL (ref 30.0–36.0)
MCV: 95.3 fL (ref 80.0–100.0)
Platelets: 247 10*3/uL (ref 150–400)
RBC: 4.25 MIL/uL (ref 4.22–5.81)
RDW: 12.9 % (ref 11.5–15.5)
WBC: 7.3 10*3/uL (ref 4.0–10.5)
nRBC: 0 % (ref 0.0–0.2)

## 2019-07-04 LAB — BASIC METABOLIC PANEL
Anion gap: 8 (ref 5–15)
BUN: 26 mg/dL — ABNORMAL HIGH (ref 8–23)
CO2: 27 mmol/L (ref 22–32)
Calcium: 8.2 mg/dL — ABNORMAL LOW (ref 8.9–10.3)
Chloride: 102 mmol/L (ref 98–111)
Creatinine, Ser: 1.31 mg/dL — ABNORMAL HIGH (ref 0.61–1.24)
GFR calc Af Amer: 59 mL/min — ABNORMAL LOW (ref >60)
GFR calc non Af Amer: 51 mL/min — ABNORMAL LOW (ref >60)
Glucose, Bld: 246 mg/dL — ABNORMAL HIGH (ref 70–99)
Potassium: 3.6 mmol/L (ref 3.5–5.1)
Sodium: 137 mmol/L (ref 135–145)

## 2019-07-04 LAB — HEMOGLOBIN A1C
Hgb A1c MFr Bld: 8.4 % — ABNORMAL HIGH (ref 4.8–5.6)
Mean Plasma Glucose: 194.38 mg/dL

## 2019-07-04 LAB — SURGICAL PCR SCREEN
MRSA, PCR: NEGATIVE
Staphylococcus aureus: NEGATIVE

## 2019-07-04 NOTE — Progress Notes (Signed)
COVID Vaccine Completed: Yes Date COVID Vaccine completed: 05/2019 COVID vaccine manufacturer: Pfizer      PCP - Dr. Charmaine Downs Cardiologist - N/A  Chest x-ray - N/A EKG - 07/04/2019 in epic Stress Test - greater than 2 years ECHO - greater than 2 years Cardiac Cath - greater than 2 years  Sleep Study - Yes CPAP - Yes  Fasting Blood Sugar - 120-160's Checks Blood Sugar ___1__ times a day  Blood Thinner Instructions: N/A Aspirin Instructions:N/A Last Dose:N/A  Anesthesia review: OSA, HTN, DM  Patient denies shortness of breath, fever, cough and chest pain at PAT appointment   Patient verbalized understanding of instructions that were given to them at the PAT appointment. Patient was also instructed that they will need to review over the PAT instructions again at home before surgery.

## 2019-07-04 NOTE — Progress Notes (Signed)
BMP results 07/04/2019 faxed to Dr. Veverly Fells and Konrad Felix P.A. via epic.

## 2019-07-08 ENCOUNTER — Other Ambulatory Visit (HOSPITAL_COMMUNITY)
Admission: RE | Admit: 2019-07-08 | Discharge: 2019-07-08 | Disposition: A | Discharge status: Home / Self Care | Payer: Medicare Other | Source: Ambulatory Visit | Attending: Orthopedic Surgery | Admitting: Orthopedic Surgery

## 2019-07-08 ENCOUNTER — Ambulatory Visit: Payer: Medicare Other | Admitting: Physician Assistant

## 2019-07-08 DIAGNOSIS — Z20822 Contact with and (suspected) exposure to covid-19: Secondary | ICD-10-CM | POA: Diagnosis not present

## 2019-07-08 DIAGNOSIS — Z01812 Encounter for preprocedural laboratory examination: Secondary | ICD-10-CM | POA: Insufficient documentation

## 2019-07-08 LAB — SARS CORONAVIRUS 2 (TAT 6-24 HRS): SARS Coronavirus 2: NEGATIVE

## 2019-07-08 NOTE — Progress Notes (Signed)
Anesthesia Chart Review   Case: [id number] Date/Time: 07/11/19 0715   Procedure: REVERSE SHOULDER ARTHROPLASTY (Left Shoulder) - interscalene block   Anesthesia type: General   Pre-op diagnosis: Left shoulder rotator cuff arthropathy   Location: [facility] ROOM 06 / [facility]   Surgeons: Netta Cedars, MD      DISCUSSION:79 y.o. never smoker with h/o HLD, GERD, OSA on CPAP, DM II, RBBB, BPH, left shoulder rotator cuff arthropathy scheduled for above procedure 07/11/2019 with Dr. Netta Cedars.   A1C 8.4 at PAT visit 07/04/19, surgeon made aware.  Blood glucose 246 at PAT visit (non fasting).  Pt reports to PAT nurse fasting blood sugar 120-160s, checking 1 time per day.   Anticipate pt can proceed with planned procedure barring acute status change.   VS: BP 135/65   Pulse 65   Temp 36.8 C (Oral)   Resp 16   Ht 5\' 5"  (1.651 m)   Wt 66.7 kg   SpO2 100%   BMI 24.46 kg/m   PROVIDERS: Leeroy Cha, MD is PCP   LABS: Labs reviewed: Acceptable for surgery. (all labs ordered are listed, but only abnormal results are displayed)  Labs Reviewed  BASIC METABOLIC PANEL - Abnormal; Notable for the following components:      Result Value   Glucose, Bld 246 (*)    BUN 26 (*)    Creatinine, Ser 1.31 (*)    Calcium 8.2 (*)    GFR calc non Af Amer 51 (*)    GFR calc Af Amer 59 (*)    All other components within normal limits  CBC - Abnormal; Notable for the following components:   Hemoglobin 12.9 (*)    All other components within normal limits  HEMOGLOBIN A1C - Abnormal; Notable for the following components:   Hgb A1c MFr Bld 8.4 (*)    All other components within normal limits  SURGICAL PCR SCREEN     IMAGES:   EKG: 07/04/2019 Rate 67 bpm Normal sinus rhythm with sinus arrhythmia Right bundle branch block Abnormal ECG No significant change since last tracing  CV:  Past Medical History:  Diagnosis Date  . Anemia    hx  . BPH (benign prostatic hyperplasia)   . Cancer  ([place])    skin cancer to arm excised, 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  . Chronic back pain   . Depression   . Diabetes mellitus without complication ([place])   . GERD (gastroesophageal reflux disease)   . History of carpal tunnel syndrome    Bilateral  . Hyperlipidemia    "borderline"  . Leg edema, right   . OSA on CPAP   . 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 CARPAL TUNNEL RELEASE Bilateral   . LUMBAR LAMINECTOMY/DECOMPRESSION MICRODISCECTOMY  09/06/2011   Procedure: LUMBAR LAMINECTOMY/DECOMPRESSION MICRODISCECTOMY 1 LEVEL;  Surgeon: Hosie Spangle, MD;  Location: [place] [facility] ORS;  Service: Neurosurgery;  Laterality: Left;  Left lumbar three-four Lumbar Laminotomy/microdiskectomy  . LUMBAR LAMINECTOMY/DECOMPRESSION MICRODISCECTOMY Left 12/26/2012   Procedure: LUMBAR LAMINECTOMY/DECOMPRESSION MICRODISCECTOMY LEFT LUMBAR THREE-FOUR ;  Surgeon: Hosie Spangle, MD;  Location: [place] NEURO ORS;  Service: Neurosurgery;  Laterality: Left;  . POLYPECTOMY     removed small intestine  . ROTATOR CUFF REPAIR Left   . TONSILLECTOMY      MEDICATIONS: . cholecalciferol (VITAMIN D) 1000 units tablet  . DULoxetine (CYMBALTA) 20 MG capsule  . DULoxetine (CYMBALTA) 20 MG capsule  . famotidine (  PEPCID) 10 MG tablet  . ferrous sulfate 325 (65 FE) MG tablet  . hydrochlorothiazide (HYDRODIURIL) 25 MG tablet  . metFORMIN (GLUCOPHAGE-XR) 500 MG 24 hr tablet  . potassium chloride SA (K-DUR,KLOR-CON) 20 MEQ tablet  . Tamsulosin HCl (FLOMAX) 0.4 MG CAPS   No current facility-administered medications for this encounter.    [facility] Pre-Surgical Testing [phone] 07/08/19  11:14 AM

## 2019-07-10 NOTE — Progress Notes (Signed)
Patient called with questions regarding preop instructions.  Patient aware no solid foods after midnite and may have clear liqiuids until 0430am morning of surgery then nothing by mouth.  Patient also aware to complete G2 drink by 0430am morning of surgery.

## 2019-07-11 ENCOUNTER — Observation Stay (HOSPITAL_COMMUNITY): Payer: Medicare Other

## 2019-07-11 ENCOUNTER — Encounter (HOSPITAL_COMMUNITY)
Admission: RE | Disposition: A | Discharge status: Home / Self Care | Payer: Self-pay | Source: Home / Self Care | Attending: Orthopedic Surgery

## 2019-07-11 ENCOUNTER — Ambulatory Visit (HOSPITAL_COMMUNITY): Payer: Medicare Other | Admitting: Certified Registered"

## 2019-07-11 ENCOUNTER — Encounter (HOSPITAL_COMMUNITY): Payer: Self-pay | Admitting: Orthopedic Surgery

## 2019-07-11 ENCOUNTER — Observation Stay (HOSPITAL_COMMUNITY)
Admission: RE | Admit: 2019-07-11 | Discharge: 2019-07-12 | Disposition: A | Discharge status: Home / Self Care | Payer: Medicare Other | Attending: Orthopedic Surgery | Admitting: Orthopedic Surgery

## 2019-07-11 ENCOUNTER — Ambulatory Visit (HOSPITAL_COMMUNITY): Payer: Medicare Other | Admitting: Physician Assistant

## 2019-07-11 ENCOUNTER — Other Ambulatory Visit: Payer: Self-pay

## 2019-07-11 DIAGNOSIS — D649 Anemia, unspecified: Secondary | ICD-10-CM | POA: Diagnosis not present

## 2019-07-11 DIAGNOSIS — M19012 Primary osteoarthritis, left shoulder: Secondary | ICD-10-CM | POA: Diagnosis not present

## 2019-07-11 DIAGNOSIS — E119 Type 2 diabetes mellitus without complications: Secondary | ICD-10-CM | POA: Insufficient documentation

## 2019-07-11 DIAGNOSIS — N400 Enlarged prostate without lower urinary tract symptoms: Secondary | ICD-10-CM | POA: Insufficient documentation

## 2019-07-11 DIAGNOSIS — Z7984 Long term (current) use of oral hypoglycemic drugs: Secondary | ICD-10-CM | POA: Diagnosis not present

## 2019-07-11 DIAGNOSIS — M25712 Osteophyte, left shoulder: Secondary | ICD-10-CM | POA: Insufficient documentation

## 2019-07-11 DIAGNOSIS — K219 Gastro-esophageal reflux disease without esophagitis: Secondary | ICD-10-CM | POA: Diagnosis not present

## 2019-07-11 DIAGNOSIS — Z96612 Presence of left artificial shoulder joint: Secondary | ICD-10-CM

## 2019-07-11 DIAGNOSIS — G4730 Sleep apnea, unspecified: Secondary | ICD-10-CM | POA: Insufficient documentation

## 2019-07-11 DIAGNOSIS — G4733 Obstructive sleep apnea (adult) (pediatric): Secondary | ICD-10-CM | POA: Diagnosis not present

## 2019-07-11 DIAGNOSIS — M75102 Unspecified rotator cuff tear or rupture of left shoulder, not specified as traumatic: Secondary | ICD-10-CM | POA: Diagnosis not present

## 2019-07-11 DIAGNOSIS — Z85820 Personal history of malignant melanoma of skin: Secondary | ICD-10-CM | POA: Diagnosis not present

## 2019-07-11 DIAGNOSIS — Z79899 Other long term (current) drug therapy: Secondary | ICD-10-CM | POA: Diagnosis not present

## 2019-07-11 HISTORY — PX: REVERSE SHOULDER ARTHROPLASTY: SHX5054

## 2019-07-11 LAB — GLUCOSE, CAPILLARY
Glucose-Capillary: 185 mg/dL — ABNORMAL HIGH (ref 70–99)
Glucose-Capillary: 247 mg/dL — ABNORMAL HIGH (ref 70–99)
Glucose-Capillary: 242 mg/dL — ABNORMAL HIGH (ref 70–99)
Glucose-Capillary: 296 mg/dL — ABNORMAL HIGH (ref 70–99)
Glucose-Capillary: 252 mg/dL — ABNORMAL HIGH (ref 70–99)

## 2019-07-11 SURGERY — ARTHROPLASTY, SHOULDER, TOTAL, REVERSE
Anesthesia: General | Site: Shoulder | Laterality: Left

## 2019-07-11 MED ORDER — ACETAMINOPHEN 325 MG PO TABS: 325.0000 mg | ORAL_TABLET | Freq: Four times a day (QID) | ORAL | Status: DC | PRN

## 2019-07-11 MED ORDER — ONDANSETRON HCL 4 MG/2ML IJ SOLN
INTRAMUSCULAR | Status: DC | PRN
  Administered 2019-07-11: 4 mg via INTRAVENOUS

## 2019-07-11 MED ORDER — LIDOCAINE 2% (20 MG/ML) 5 ML SYRINGE
INTRAMUSCULAR | Status: AC
  Filled 2019-07-11: qty 5

## 2019-07-11 MED ORDER — METOCLOPRAMIDE HCL 5 MG PO TABS: 5.0000 mg | ORAL_TABLET | Freq: Three times a day (TID) | ORAL | Status: DC | PRN

## 2019-07-11 MED ORDER — ORAL CARE MOUTH RINSE
15.0000 mL | Freq: Once | OROMUCOSAL | Status: AC
  Administered 2019-07-11: 15 mL via OROMUCOSAL

## 2019-07-11 MED ORDER — ONDANSETRON HCL 4 MG/2ML IJ SOLN
INTRAMUSCULAR | Status: AC
  Filled 2019-07-11: qty 2

## 2019-07-11 MED ORDER — PHENYLEPHRINE 40 MCG/ML (10ML) SYRINGE FOR IV PUSH (FOR BLOOD PRESSURE SUPPORT)
PREFILLED_SYRINGE | INTRAVENOUS | Status: DC | PRN
  Administered 2019-07-11: 40 ug via INTRAVENOUS

## 2019-07-11 MED ORDER — INSULIN ASPART 100 UNIT/ML ~~LOC~~ SOLN
0.0000 [IU] | Freq: Every day | SUBCUTANEOUS | Status: DC
  Administered 2019-07-11: 3 [IU] via SUBCUTANEOUS

## 2019-07-11 MED ORDER — DOCUSATE SODIUM 100 MG PO CAPS
100.0000 mg | ORAL_CAPSULE | Freq: Two times a day (BID) | ORAL | Status: DC
  Administered 2019-07-11 – 2019-07-12 (×2): 100 mg via ORAL
  Filled 2019-07-11 (×2): qty 1

## 2019-07-11 MED ORDER — STERILE WATER FOR IRRIGATION IR SOLN
Status: DC | PRN
  Administered 2019-07-11: 2000 mL

## 2019-07-11 MED ORDER — EPHEDRINE 5 MG/ML INJ
INTRAVENOUS | Status: AC
  Filled 2019-07-11: qty 10

## 2019-07-11 MED ORDER — CHLORHEXIDINE GLUCONATE 0.12 % MT SOLN: 15.0000 mL | Freq: Once | OROMUCOSAL | Status: AC

## 2019-07-11 MED ORDER — SODIUM CHLORIDE 0.9 % IR SOLN
Status: DC | PRN
  Administered 2019-07-11: 1000 mL

## 2019-07-11 MED ORDER — DEXAMETHASONE SODIUM PHOSPHATE 10 MG/ML IJ SOLN
INTRAMUSCULAR | Status: DC | PRN
  Administered 2019-07-11: 4 mg via INTRAVENOUS

## 2019-07-11 MED ORDER — HYDROMORPHONE HCL 1 MG/ML IJ SOLN: 0.2500 mg | INTRAMUSCULAR | Status: DC | PRN

## 2019-07-11 MED ORDER — LACTATED RINGERS IV SOLN: INTRAVENOUS | Status: DC

## 2019-07-11 MED ORDER — SUCCINYLCHOLINE CHLORIDE 200 MG/10ML IV SOSY
PREFILLED_SYRINGE | INTRAVENOUS | Status: DC | PRN
  Administered 2019-07-11: 140 mg via INTRAVENOUS

## 2019-07-11 MED ORDER — VITAMIN D 25 MCG (1000 UNIT) PO TABS
1000.0000 [IU] | ORAL_TABLET | Freq: Every day | ORAL | Status: DC
  Administered 2019-07-12: 1000 [IU] via ORAL
  Filled 2019-07-11: qty 1

## 2019-07-11 MED ORDER — METHOCARBAMOL 1000 MG/10ML IJ SOLN
500.0000 mg | Freq: Four times a day (QID) | INTRAVENOUS | Status: DC | PRN
  Filled 2019-07-11: qty 5

## 2019-07-11 MED ORDER — ONDANSETRON HCL 4 MG PO TABS: 4.0000 mg | ORAL_TABLET | Freq: Four times a day (QID) | ORAL | Status: DC | PRN

## 2019-07-11 MED ORDER — MEPERIDINE HCL 50 MG/ML IJ SOLN: 6.2500 mg | INTRAMUSCULAR | Status: DC | PRN

## 2019-07-11 MED ORDER — FENTANYL CITRATE (PF) 250 MCG/5ML IJ SOLN
INTRAMUSCULAR | Status: DC | PRN
  Administered 2019-07-11 (×2): 50 ug via INTRAVENOUS

## 2019-07-11 MED ORDER — HYDROCODONE-ACETAMINOPHEN 5-325 MG PO TABS: 1.0000 | ORAL_TABLET | Freq: Four times a day (QID) | ORAL | 0 refills | Status: DC | PRN

## 2019-07-11 MED ORDER — ONDANSETRON HCL 4 MG/2ML IJ SOLN: 4.0000 mg | Freq: Once | INTRAMUSCULAR | Status: DC | PRN

## 2019-07-11 MED ORDER — FENTANYL CITRATE (PF) 100 MCG/2ML IJ SOLN
INTRAMUSCULAR | Status: AC
  Filled 2019-07-11: qty 2

## 2019-07-11 MED ORDER — MORPHINE SULFATE (PF) 2 MG/ML IV SOLN: 0.5000 mg | INTRAVENOUS | Status: DC | PRN

## 2019-07-11 MED ORDER — MENTHOL 3 MG MT LOZG: 1.0000 | LOZENGE | OROMUCOSAL | Status: DC | PRN

## 2019-07-11 MED ORDER — TAMSULOSIN HCL 0.4 MG PO CAPS
0.4000 mg | ORAL_CAPSULE | Freq: Every day | ORAL | Status: DC
  Administered 2019-07-12: 0.4 mg via ORAL
  Filled 2019-07-11: qty 1

## 2019-07-11 MED ORDER — ONDANSETRON HCL 4 MG/2ML IJ SOLN: 4.0000 mg | Freq: Four times a day (QID) | INTRAMUSCULAR | Status: DC | PRN

## 2019-07-11 MED ORDER — INSULIN ASPART 100 UNIT/ML ~~LOC~~ SOLN
0.0000 [IU] | Freq: Three times a day (TID) | SUBCUTANEOUS | Status: DC
  Administered 2019-07-11: 5 [IU] via SUBCUTANEOUS
  Administered 2019-07-11: 3 [IU] via SUBCUTANEOUS
  Administered 2019-07-12: 2 [IU] via SUBCUTANEOUS

## 2019-07-11 MED ORDER — PROPOFOL 10 MG/ML IV BOLUS
INTRAVENOUS | Status: AC
  Filled 2019-07-11: qty 20

## 2019-07-11 MED ORDER — HYDROCODONE-ACETAMINOPHEN 5-325 MG PO TABS: 1.0000 | ORAL_TABLET | ORAL | Status: DC | PRN

## 2019-07-11 MED ORDER — FERROUS SULFATE 325 (65 FE) MG PO TABS
325.0000 mg | ORAL_TABLET | Freq: Every day | ORAL | Status: DC
  Administered 2019-07-12: 325 mg via ORAL
  Filled 2019-07-11: qty 1

## 2019-07-11 MED ORDER — PHENYLEPHRINE HCL-NACL 10-0.9 MG/250ML-% IV SOLN
INTRAVENOUS | Status: DC | PRN
Start: 2019-07-11 — End: 2019-07-11
  Administered 2019-07-11: 20 ug/min via INTRAVENOUS

## 2019-07-11 MED ORDER — FAMOTIDINE 20 MG PO TABS
10.0000 mg | ORAL_TABLET | Freq: Every day | ORAL | Status: DC
  Administered 2019-07-12: 10 mg via ORAL
  Filled 2019-07-11: qty 1

## 2019-07-11 MED ORDER — EPHEDRINE SULFATE-NACL 50-0.9 MG/10ML-% IV SOSY
PREFILLED_SYRINGE | INTRAVENOUS | Status: DC | PRN
  Administered 2019-07-11 (×6): 10 mg via INTRAVENOUS
  Administered 2019-07-11: 5 mg via INTRAVENOUS

## 2019-07-11 MED ORDER — MIDAZOLAM HCL 2 MG/2ML IJ SOLN
INTRAMUSCULAR | Status: DC | PRN
  Administered 2019-07-11: 1 mg via INTRAVENOUS

## 2019-07-11 MED ORDER — MIDAZOLAM HCL 2 MG/2ML IJ SOLN
INTRAMUSCULAR | Status: AC
  Filled 2019-07-11: qty 2

## 2019-07-11 MED ORDER — METHOCARBAMOL 500 MG PO TABS: 500.0000 mg | ORAL_TABLET | Freq: Four times a day (QID) | ORAL | Status: DC | PRN

## 2019-07-11 MED ORDER — INSULIN ASPART 100 UNIT/ML ~~LOC~~ SOLN
3.0000 [IU] | Freq: Three times a day (TID) | SUBCUTANEOUS | Status: DC
  Administered 2019-07-11 – 2019-07-12 (×3): 3 [IU] via SUBCUTANEOUS

## 2019-07-11 MED ORDER — HYDROCHLOROTHIAZIDE 25 MG PO TABS
25.0000 mg | ORAL_TABLET | Freq: Every day | ORAL | Status: DC
  Administered 2019-07-12: 25 mg via ORAL
  Filled 2019-07-11: qty 1

## 2019-07-11 MED ORDER — PHENOL 1.4 % MT LIQD: 1.0000 | OROMUCOSAL | Status: DC | PRN

## 2019-07-11 MED ORDER — METHOCARBAMOL 500 MG PO TABS: 500.0000 mg | ORAL_TABLET | Freq: Three times a day (TID) | ORAL | 1 refills | Status: DC | PRN

## 2019-07-11 MED ORDER — METFORMIN HCL ER 500 MG PO TB24
500.0000 mg | ORAL_TABLET | Freq: Every day | ORAL | Status: DC
  Administered 2019-07-12: 500 mg via ORAL
  Filled 2019-07-11: qty 1

## 2019-07-11 MED ORDER — METOCLOPRAMIDE HCL 5 MG/ML IJ SOLN: 5.0000 mg | Freq: Three times a day (TID) | INTRAMUSCULAR | Status: DC | PRN

## 2019-07-11 MED ORDER — PHENYLEPHRINE 40 MCG/ML (10ML) SYRINGE FOR IV PUSH (FOR BLOOD PRESSURE SUPPORT)
PREFILLED_SYRINGE | INTRAVENOUS | Status: AC
  Filled 2019-07-11: qty 10

## 2019-07-11 MED ORDER — CEFAZOLIN SODIUM-DEXTROSE 2-4 GM/100ML-% IV SOLN
2.0000 g | Freq: Four times a day (QID) | INTRAVENOUS | Status: AC
  Administered 2019-07-11 – 2019-07-12 (×3): 2 g via INTRAVENOUS
  Filled 2019-07-11 (×3): qty 100

## 2019-07-11 MED ORDER — DULOXETINE HCL 20 MG PO CPEP
20.0000 mg | ORAL_CAPSULE | ORAL | Status: DC
  Administered 2019-07-12: 20 mg via ORAL
  Filled 2019-07-11: qty 1

## 2019-07-11 MED ORDER — PROPOFOL 10 MG/ML IV BOLUS
INTRAVENOUS | Status: DC | PRN
  Administered 2019-07-11: 30 mg via INTRAVENOUS
  Administered 2019-07-11: 100 mg via INTRAVENOUS

## 2019-07-11 MED ORDER — SODIUM CHLORIDE 0.9 % IV SOLN: INTRAVENOUS | Status: DC

## 2019-07-11 MED ORDER — CEFAZOLIN SODIUM-DEXTROSE 2-4 GM/100ML-% IV SOLN
2.0000 g | INTRAVENOUS | Status: AC
  Administered 2019-07-11: 2 g via INTRAVENOUS
  Filled 2019-07-11: qty 100

## 2019-07-11 MED ORDER — SUCCINYLCHOLINE CHLORIDE 200 MG/10ML IV SOSY
PREFILLED_SYRINGE | INTRAVENOUS | Status: AC
  Filled 2019-07-11: qty 10

## 2019-07-11 MED ORDER — PHENYLEPHRINE HCL (PRESSORS) 10 MG/ML IV SOLN
INTRAVENOUS | Status: AC
  Filled 2019-07-11: qty 1

## 2019-07-11 MED ORDER — DEXAMETHASONE SODIUM PHOSPHATE 10 MG/ML IJ SOLN
INTRAMUSCULAR | Status: AC
  Filled 2019-07-11: qty 1

## 2019-07-11 MED ORDER — BUPIVACAINE-EPINEPHRINE 0.5% -1:200000 IJ SOLN
INTRAMUSCULAR | Status: DC | PRN
  Administered 2019-07-11: 10 mL

## 2019-07-11 MED ORDER — POTASSIUM CHLORIDE CRYS ER 20 MEQ PO TBCR
20.0000 meq | EXTENDED_RELEASE_TABLET | Freq: Every day | ORAL | Status: DC
  Administered 2019-07-11 – 2019-07-12 (×2): 20 meq via ORAL
  Filled 2019-07-11 (×3): qty 1

## 2019-07-11 SURGICAL SUPPLY — 72 items
AID PSTN UNV HD RSTRNT DISP (MISCELLANEOUS) ×1
BAG SPEC THK2 15X12 ZIP CLS (MISCELLANEOUS)
BAG ZIPLOCK 12X15 (MISCELLANEOUS) IMPLANT
BIT DRILL 1.6MX128 (BIT) IMPLANT
BIT DRILL 170X2.5X (BIT) IMPLANT
BIT DRL 170X2.5X (BIT) ×1
BLADE SAG 18X100X1.27 (BLADE) ×2 IMPLANT
COVER BACK TABLE 60X90IN (DRAPES) ×2 IMPLANT
COVER SURGICAL LIGHT HANDLE (MISCELLANEOUS) ×2 IMPLANT
COVER WAND RF STERILE (DRAPES) IMPLANT
DECANTER SPIKE VIAL GLASS SM (MISCELLANEOUS) ×2 IMPLANT
DRAPE INCISE IOBAN 66X45 STRL (DRAPES) ×2 IMPLANT
DRAPE ORTHO SPLIT 77X108 STRL (DRAPES) ×4
DRAPE SHEET LG 3/4 BI-LAMINATE (DRAPES) ×2 IMPLANT
DRAPE SURG ORHT 6 SPLT 77X108 (DRAPES) ×2 IMPLANT
DRAPE U-SHAPE 47X51 STRL (DRAPES) ×2 IMPLANT
DRILL 2.5 (BIT) ×2
DRSG ADAPTIC 3X8 NADH LF (GAUZE/BANDAGES/DRESSINGS) ×2 IMPLANT
DRSG PAD ABDOMINAL 8X10 ST (GAUZE/BANDAGES/DRESSINGS) ×2 IMPLANT
DURAPREP 26ML APPLICATOR (WOUND CARE) ×2 IMPLANT
ELECT BLADE TIP CTD 4 INCH (ELECTRODE) ×2 IMPLANT
ELECT NDL TIP 2.8 STRL (NEEDLE) ×1 IMPLANT
ELECT NEEDLE TIP 2.8 STRL (NEEDLE) ×2 IMPLANT
ELECT REM PT RETURN 15FT ADLT (MISCELLANEOUS) ×2 IMPLANT
EPI LT SZ 1 (Orthopedic Implant) ×2 IMPLANT
EPIPHYSIS LT SZ 1 (Orthopedic Implant) IMPLANT
GAUZE SPONGE 4X4 12PLY STRL (GAUZE/BANDAGES/DRESSINGS) ×2 IMPLANT
GLENOSPHERE LATRLZD 38 SHLDR (Miscellaneous) ×1 IMPLANT
GLOVE BIOGEL PI ORTHO PRO 7.5 (GLOVE) ×1
GLOVE BIOGEL PI ORTHO PRO SZ8 (GLOVE) ×1
GLOVE ORTHO TXT STRL SZ7.5 (GLOVE) ×2 IMPLANT
GLOVE PI ORTHO PRO STRL 7.5 (GLOVE) ×1 IMPLANT
GLOVE PI ORTHO PRO STRL SZ8 (GLOVE) ×1 IMPLANT
GLOVE SURG ORTHO 8.5 STRL (GLOVE) ×2 IMPLANT
GOWN STRL REUS W/TWL XL LVL3 (GOWN DISPOSABLE) ×4 IMPLANT
KIT BASIN (CUSTOM PROCEDURE TRAY) ×2 IMPLANT
KIT TURNOVER KIT A (KITS) IMPLANT
MANIFOLD NEPTUNE II (INSTRUMENTS) ×2 IMPLANT
METAGLENE DELTA EXTEND (Trauma) IMPLANT
METAGLENE DXTEND (Trauma) ×2 IMPLANT
NDL MAYO CATGUT SZ4 TPR NDL (NEEDLE) IMPLANT
NEEDLE MAYO CATGUT SZ4 (NEEDLE) IMPLANT
NS IRRIG 1000ML POUR BTL (IV SOLUTION) ×2 IMPLANT
PACK SHOULDER (CUSTOM PROCEDURE TRAY) ×2 IMPLANT
PENCIL SMOKE EVACUATOR (MISCELLANEOUS) IMPLANT
PIN GUIDE 1.2 (PIN) ×1 IMPLANT
PIN GUIDE GLENOPHERE 1.5MX300M (PIN) ×1 IMPLANT
PIN METAGLENE 2.5 (PIN) ×1 IMPLANT
PROTECTOR NERVE ULNAR (MISCELLANEOUS) ×2 IMPLANT
RESTRAINT HEAD UNIVERSAL NS (MISCELLANEOUS) ×2 IMPLANT
SCREW 4.5X18MM (Screw) ×2 IMPLANT
SCREW 4.5X36MM (Screw) ×1 IMPLANT
SCREW BN 18X4.5XSTRL SHLDR (Screw) IMPLANT
SCREW LOCK 42 (Screw) ×1 IMPLANT
SLING ARM FOAM STRAP LRG (SOFTGOODS) ×1 IMPLANT
SMARTMIX MINI TOWER (MISCELLANEOUS)
SPACER 38 PLUS 3 (Spacer) ×1 IMPLANT
SPONGE LAP 4X18 RFD (DISPOSABLE) IMPLANT
STEM DELTA DIA 10 HA (Stem) ×1 IMPLANT
STRIP CLOSURE SKIN 1/2X4 (GAUZE/BANDAGES/DRESSINGS) ×2 IMPLANT
SUCTION FRAZIER HANDLE 10FR (MISCELLANEOUS) ×2
SUCTION TUBE FRAZIER 10FR DISP (MISCELLANEOUS) ×1 IMPLANT
SUT FIBERWIRE #2 38 T-5 BLUE (SUTURE) ×4
SUT MNCRL AB 4-0 PS2 18 (SUTURE) ×2 IMPLANT
SUT VIC AB 0 CT1 36 (SUTURE) ×4 IMPLANT
SUT VIC AB 0 CT2 27 (SUTURE) ×2 IMPLANT
SUT VIC AB 2-0 CT1 27 (SUTURE) ×2
SUT VIC AB 2-0 CT1 TAPERPNT 27 (SUTURE) ×1 IMPLANT
SUTURE FIBERWR #2 38 T-5 BLUE (SUTURE) ×2 IMPLANT
TOWEL OR 17X26 10 PK STRL BLUE (TOWEL DISPOSABLE) ×2 IMPLANT
TOWER SMARTMIX MINI (MISCELLANEOUS) IMPLANT
YANKAUER SUCT BULB TIP 10FT TU (MISCELLANEOUS) ×2 IMPLANT

## 2019-07-11 NOTE — Anesthesia Preprocedure Evaluation (Signed)
Anesthesia Evaluation  Patient identified by MRN, date of birth, ID band Patient awake    Reviewed: Allergy & Precautions, NPO status , Patient's Chart, lab work & pertinent test results  Airway Mallampati: I  TM Distance: >3 FB Neck ROM: Full    Dental   Pulmonary sleep apnea ,    Pulmonary exam normal        Cardiovascular Normal cardiovascular exam     Neuro/Psych Depression    GI/Hepatic GERD  Medicated and Controlled,  Endo/Other  diabetes  Renal/GU      Musculoskeletal   Abdominal   Peds  Hematology   Anesthesia Other Findings   Reproductive/Obstetrics                             Anesthesia Physical Anesthesia Plan  ASA: III  Anesthesia Plan: General   Post-op Pain Management:  Regional for Post-op pain   Induction: Intravenous  PONV Risk Score and Plan: 2  Airway Management Planned: Oral ETT  Additional Equipment:   Intra-op Plan:   Post-operative Plan: Extubation in OR  Informed Consent: I have reviewed the patients History and Physical, chart, labs and discussed the procedure including the risks, benefits and alternatives for the proposed anesthesia with the patient or authorized representative who has indicated his/her understanding and acceptance.       Plan Discussed with: CRNA and Surgeon  Anesthesia Plan Comments:         Anesthesia Quick Evaluation

## 2019-07-11 NOTE — Discharge Instructions (Signed)
Ice to the shoulder constantly.  Keep the incision covered and clean and dry for one week, then ok to get it wet in the shower. ° °Do exercise as instructed several times per day. ° °DO NOT reach behind your back or push up out of a chair with the operative arm. ° °Use a sling while you are up and around for comfort, may remove while seated.  Keep pillow propped behind the operative elbow. ° °Follow up with Dr Auna Mikkelsen in two weeks in the office, call [phone] for appt °

## 2019-07-11 NOTE — Anesthesia Postprocedure Evaluation (Signed)
Anesthesia Post Note  Patient: Antonio [name]  Procedure(s) Performed: REVERSE SHOULDER ARTHROPLASTY (Left Shoulder)     Patient location during evaluation: PACU Anesthesia Type: General Level of consciousness: awake and alert Pain management: pain level controlled Vital Signs Assessment: post-procedure vital signs reviewed and stable Respiratory status: spontaneous breathing, nonlabored ventilation, respiratory function stable and patient connected to nasal cannula oxygen Cardiovascular status: blood pressure returned to baseline and stable Postop Assessment: no apparent nausea or vomiting Anesthetic complications: no   No complications documented.  Last Vitals:  Vitals:   07/11/19 1414 07/11/19 1427  BP: 107/62 (!) 114/58  Pulse: 91 (!) 101  Resp: 18 14  Temp: 36.6 C 36.6 C  SpO2: 99% 98%    Last Pain:  Vitals:   07/11/19 1427  TempSrc: Oral  PainSc:                  Cataleia Gade DAVID

## 2019-07-11 NOTE — Op Note (Signed)
NAME: TAREZ, BOWNS MEDICAL RECORD [id number] ACCOUNT [id number] DATE OF BIRTH:Apr 24, 1940 FACILITY: [facility] LOCATION: [facility] PHYSICIAN:STEVEN R. Kristalyn Bergstresser, MD  OPERATIVE REPORT  DATE OF PROCEDURE:  07/11/2019  PREOPERATIVE DIAGNOSIS:  Left shoulder rotator cuff tear arthropathy.  POSTOPERATIVE DIAGNOSIS:  Left shoulder rotator cuff tear arthropathy.  PROCEDURE PERFORMED:  Left reverse total shoulder replacement using DePuy Delta Xtend prosthesis.  ATTENDING SURGEON:  Esmond Plants, MD   ASSISTANT:  Darol Destine, [place], who was scrubbed during the entire procedure and necessary for satisfactory completion of surgery.  ANESTHESIA:  General anesthesia was used plus interscalene block.  ESTIMATED BLOOD LOSS:  Less than 100 mL.  FLUID REPLACEMENT:  1500 mL crystalloid.  INSTRUMENT COUNTS:  Correct.  COMPLICATIONS:  There were no complications.  ANTIBIOTICS:  Perioperative antibiotics were given.  INDICATIONS:  The patient is an 79 year old male with a history of worsening left shoulder pain and dysfunction secondary to end-stage rotator cuff tear arthropathy.  The patient has failed an extended period of conservative management and presents for  operative treatment to restore function and eliminate pain.  Informed consent obtained.  DESCRIPTION OF PROCEDURE:  After an adequate level of anesthesia was achieved, the patient was positioned in the modified beach chair position.  Left shoulder correctly identified and sterilely prepped and draped in the usual manner.  Time-out called,  verifying correct patient, correct site.  We entered the patient's shoulder using a deltopectoral approach starting at the coracoid process extending down to the anterior humerus dissection down through subcutaneous tissues using Bovie.  We identified  cephalic vein, took that laterally with the deltoid.  Pectoralis taken medially.  Conjoined tendon identified and retracted medially.  Biceps was  tenodesed in situ with 0 Vicryl figure-of-eight suture.  Released the subscapularis remnant, which was not  repairable, but tagged for protection of the axillary nerve and retracted that.  We released the inferior capsule extending the shoulder delivering the humerus out of the wound.  Had bone-on-bone arthritis, complete loss of rotator cuff, maybe a touch of  teres minor remaining in the back.  We removed some extraneous suture material.  We then entered the proximal humerus with a 6 mm reamer, reaming up to a size 10.  We placed our 10 mm intramedullary guide and resected the humeral head at 10 degrees of  retroversion with the oscillating saw using the jig.  We then removed excess osteophytes with a rongeur, reduced the shoulder and subluxed it posteriorly.  We gained exposure of the glenoid face performing a capsular and labrum removal.  We also removed  the remnants of the rotator cuff that was retracted, but still taking up some space posterior superiorly.  Once we had that area cleaned out we found our center point for our guide pin, placed our guide pin using power.  We then reamed for the metaglene  baseplate.  We then used our peripheral hand reamer, drilled our central peg hole out and impacted the metaglene into position inferiorly and angled slightly with an inferior tilt.  We then placed a 36 screw inferiorly, a 42 screw at the base of the  coracoid, and an 18 nonlocked anteriorly.  We had good baseplate security and fixation.  We selected a 38 eccentric glenosphere with +0.  We screwed that down to the baseplate.  We did a finger sweep to make sure no soft tissues were incorporated in  that.  Next, we went ahead and extended the shoulder delivering the humeral head out of the  wound.  We prepared the metaphysis with the 1 left reamer and then placed the size 10 stem with a 1 left metaphysis set on the 0 setting and placed in 10 degrees  of retroversion.  We impacted that in position and had  good coverage and then we placed a 38+3 poly on the humeral tray and then reduced the shoulder.  It was a little tight, but the axillary nerve was not under undue tension and it was stable.  We  removed the trial components, irrigated thoroughly, used available bone graft from the humeral head and then press-fit the HA coated 10 stem, 1 left, set on the 0 setting and placed in 10 degrees of retroversion.  We had good nice stable stem and tray.   We went and put a 38+3 real poly on, impacted that in place, and then reduced the shoulder.  Conjoined appropriately tensioned, a little tight again, but not too tight and stable through a full arc of motion with no impingement.  We irrigated thoroughly,  closed deltopectoral interval with 0 Vicryl suture followed by 2-0 Vicryl for subcutaneous closure and 4-0 Monocryl for skin.  Steri-Strips applied followed by sterile dressing and a shoulder sling.  The patient transported to recovery room in stable  condition.  CN/NUANCE  D:07/11/2019 T:07/11/2019 JOB:[id number]

## 2019-07-11 NOTE — Anesthesia Procedure Notes (Signed)
Procedure Name: Intubation Date/Time: 07/11/2019 7:42 AM Performed by: Eben Burow, CRNA Pre-anesthesia Checklist: Patient identified, Emergency Drugs available, Suction available, Patient being monitored and Timeout performed Patient Re-evaluated:Patient Re-evaluated prior to induction Oxygen Delivery Method: Circle system utilized Preoxygenation: Pre-oxygenation with 100% oxygen Induction Type: IV induction Ventilation: Mask ventilation without difficulty Laryngoscope Size: Glidescope and 3 Grade View: Grade I Tube type: Oral Tube size: 7.5 mm Number of attempts: 1 Airway Equipment and Method: Stylet Placement Confirmation: ETT inserted through vocal cords under direct vision,  positive ETCO2 and breath sounds checked- equal and bilateral Secured at: 22 cm Tube secured with: Tape Dental Injury: Teeth and Oropharynx as per pre-operative assessment  Comments: DVL x 1 arytenoids only visualized, Glidescope x 1 with view of cords, easy Glidescope intubation, +/= BBS, + EtCO2.

## 2019-07-11 NOTE — Brief Op Note (Signed)
07/11/2019  9:17 AM  PATIENT:  Antonio [name]  79 y.o. male  PRE-OPERATIVE DIAGNOSIS:  Left shoulder rotator cuff arthropathy  POST-OPERATIVE DIAGNOSIS:  Left shoulder rotator cuff arthropathy  PROCEDURE:  Procedure(s) with comments: REVERSE SHOULDER ARTHROPLASTY (Left) - interscalene block DePuy Delta Xtend  SURGEON:  Surgeon(s) and Role:    Netta Cedars, MD - Primary  PHYSICIAN ASSISTANT:   ASSISTANTS: Ventura Bruns, PA-C   ANESTHESIA:   regional and general  EBL:  100 mL   BLOOD ADMINISTERED:none  DRAINS: none   LOCAL MEDICATIONS USED:  MARCAINE     SPECIMEN:  No Specimen  DISPOSITION OF SPECIMEN:  N/A  COUNTS:  YES  TOURNIQUET:  * No tourniquets in log *  DICTATION: .Other Dictation: Dictation Number [id number]  PLAN OF CARE: Admit for overnight observation  PATIENT DISPOSITION:  PACU - hemodynamically stable.   Delay start of Pharmacological VTE agent (>24hrs) due to surgical blood loss or risk of bleeding: not applicable

## 2019-07-11 NOTE — Transfer of Care (Signed)
Immediate Anesthesia Transfer of Care Note  Patient: Antonio [name]  Procedure(s) Performed: REVERSE SHOULDER ARTHROPLASTY (Left Shoulder)  Patient Location: PACU  Anesthesia Type:General  Level of Consciousness: awake, drowsy and patient cooperative  Airway & Oxygen Therapy: Patient connected to T-piece oxygen  Post-op Assessment: Report given to RN and Post -op Vital signs reviewed and stable  Post vital signs: Reviewed and stable  Last Vitals:  Vitals Value Taken Time  BP 162/78 07/11/19 0930  Temp    Pulse 94 07/11/19 0930  Resp 15 07/11/19 0930  SpO2 100 % 07/11/19 0930  Vitals shown include unvalidated device data.  Last Pain:  Vitals:   07/11/19 0550  TempSrc: Oral      Patients Stated Pain Goal: 4 (44/81/85 6314)  Complications: No complications documented.

## 2019-07-11 NOTE — Interval H&P Note (Signed)
History and Physical Interval Note:  07/11/2019 7:24 AM  Antonio [name]  has presented today for surgery, with the diagnosis of Left shoulder rotator cuff arthropathy.  The various methods of treatment have been discussed with the patient and family. After consideration of risks, benefits and other options for treatment, the patient has consented to  Procedure(s) with comments: REVERSE SHOULDER ARTHROPLASTY (Left) - interscalene block as a surgical intervention.  The patient's history has been reviewed, patient examined, no change in status, stable for surgery.  I have reviewed the patient's chart and labs.  Questions were answered to the patient's satisfaction.     Augustin Schooling

## 2019-07-12 DIAGNOSIS — M75102 Unspecified rotator cuff tear or rupture of left shoulder, not specified as traumatic: Secondary | ICD-10-CM | POA: Diagnosis not present

## 2019-07-12 LAB — BASIC METABOLIC PANEL
Anion gap: 8 (ref 5–15)
BUN: 26 mg/dL — ABNORMAL HIGH (ref 8–23)
CO2: 25 mmol/L (ref 22–32)
Calcium: 7.9 mg/dL — ABNORMAL LOW (ref 8.9–10.3)
Chloride: 103 mmol/L (ref 98–111)
Creatinine, Ser: 1.46 mg/dL — ABNORMAL HIGH (ref 0.61–1.24)
GFR calc Af Amer: 52 mL/min — ABNORMAL LOW (ref >60)
GFR calc non Af Amer: 45 mL/min — ABNORMAL LOW (ref >60)
Glucose, Bld: 180 mg/dL — ABNORMAL HIGH (ref 70–99)
Potassium: 3.2 mmol/L — ABNORMAL LOW (ref 3.5–5.1)
Sodium: 136 mmol/L (ref 135–145)

## 2019-07-12 LAB — GLUCOSE, CAPILLARY: Glucose-Capillary: 151 mg/dL — ABNORMAL HIGH (ref 70–99)

## 2019-07-12 LAB — HEMOGLOBIN AND HEMATOCRIT, BLOOD
HCT: 31.6 % — ABNORMAL LOW (ref 39.0–52.0)
Hemoglobin: 10.5 g/dL — ABNORMAL LOW (ref 13.0–17.0)

## 2019-07-12 MED ORDER — ONDANSETRON HCL 4 MG PO TABS: 4.0000 mg | ORAL_TABLET | Freq: Three times a day (TID) | ORAL | 1 refills | Status: DC | PRN

## 2019-07-12 NOTE — Progress Notes (Signed)
Orthopedics Progress Note  Subjective: Patient comfortable this AM and tolerating PO well  Objective:  Vitals:   07/12/19 0114 07/12/19 0508  BP: 118/62 128/67  Pulse: 77 83  Resp: 18 16  Temp: 98.3 F (36.8 C) 98.6 F (37 C)  SpO2: 95% 96%    General: Awake and alert  Musculoskeletal: Left shoulder dressing changed, minimal swelling and no draininage Neurovascularly intact  Lab Results  Component Value Date   WBC 7.3 07/04/2019   HGB 10.5 (L) 07/12/2019   HCT 31.6 (L) 07/12/2019   MCV 95.3 07/04/2019   PLT 247 07/04/2019       Component Value Date/Time   NA 136 07/12/2019 0252   K 3.2 (L) 07/12/2019 0252   CL 103 07/12/2019 0252   CO2 25 07/12/2019 0252   GLUCOSE 180 (H) 07/12/2019 0252   BUN 26 (H) 07/12/2019 0252   CREATININE 1.46 (H) 07/12/2019 0252   CALCIUM 7.9 (L) 07/12/2019 0252   GFRNONAA 45 (L) 07/12/2019 0252   GFRAA 52 (L) 07/12/2019 0252    Lab Results  Component Value Date   INR 0.96 01/11/2016    Assessment/Plan: POD #1 s/p Procedure(s): REVERSE SHOULDER ARTHROPLASTY Discharge home Follow up in 10 days  Doran Heater. Veverly Fells, MD 07/12/2019 9:31 AM

## 2019-07-12 NOTE — Evaluation (Addendum)
Occupational Therapy Evaluation Patient Details Name: Antonio [name] MRN: [id number] DOB: 01-09-1941 Today's Date: 07/12/2019    History of Present Illness Patient is an 79 year old male s/p Left reverse total shoulder replacement    Clinical Impression   Educated patient/spouse regarding shoulder protocol, prescribed exercises and compensatory strategies for self care. Patient require mod A to don tee shirt, recommend button up which patient has at home.    Follow Up Recommendations  Follow surgeon's recommendation for DC plan and follow-up therapies    Equipment Recommendations  None recommended by OT       Precautions / Restrictions Precautions Precautions: Shoulder Type of Shoulder Precautions: AROM elbow, wrist, hand ok; A/PROM sh FF 90, ABD 60, ER 30 Shoulder Interventions: Shoulder sling/immobilizer;For comfort (when sleeping) Precaution Booklet Issued: Yes (comment) Required Braces or Orthoses: Sling Restrictions Weight Bearing Restrictions: Yes LUE Weight Bearing: Non weight bearing      Mobility Bed Mobility Overal bed mobility: Modified Independent                Transfers Overall transfer level: Modified independent                    Balance Overall balance assessment: Mild deficits observed, not formally tested                                         ADL either performed or assessed with clinical judgement   ADL Overall ADL's : Needs assistance/impaired     Grooming: Modified independent   Upper Body Bathing: Supervision/ safety   Lower Body Bathing: Supervison/ safety   Upper Body Dressing : Moderate assistance;Minimal assistance;Cueing for UE precautions;Cueing for compensatory techniques;Cueing for sequencing;Standing Upper Body Dressing Details (indicate cue type and reason): to don tee shirt, recommend button ups which patient has at home Lower Body Dressing: Supervision/safety;Sitting/lateral leans;Sit  to/from stand   Toilet Transfer: Modified Dentist and Hygiene: Supervision/safety       Functional mobility during ADLs: Modified independent General ADL Comments: educate patient and spouse re: compensatory strategies for self care     Vision Baseline Vision/History: Wears glasses Wears Glasses: At all times              Pertinent Vitals/Pain Pain Assessment: No/denies pain     Hand Dominance Right   Extremity/Trunk Assessment Upper Extremity Assessment Upper Extremity Assessment: LUE deficits/detail LUE Deficits / Details: AROM elbow, wrist, hand grossly intact   Lower Extremity Assessment Lower Extremity Assessment: Overall WFL for tasks assessed       Communication Communication Communication: HOH   Cognition Arousal/Alertness: Awake/alert Behavior During Therapy: WFL for tasks assessed/performed Overall Cognitive Status: Within Functional Limits for tasks assessed                                        Exercises Exercises: Shoulder Shoulder Exercises Elbow Flexion: AROM;Left;5 reps;Seated Elbow Extension: AROM;Left;5 reps;Seated Wrist Flexion: AROM;Left;5 reps;Seated Wrist Extension: AROM;Left;5 reps;Seated Digit Composite Flexion: AROM;Left;5 reps;Seated Composite Extension: AROM;Left;5 reps;Seated   Shoulder Instructions Shoulder Instructions Donning/doffing shirt without moving shoulder: Minimal assistance;Moderate assistance;Patient able to independently direct caregiver Method for sponge bathing under operated UE: Supervision/safety;Patient able to independently direct caregiver Donning/doffing sling/immobilizer: Patient able to independently direct caregiver Correct positioning of sling/immobilizer: Independent;Patient  able to independently direct caregiver Pendulum exercises (written home exercise program):  (N/A) ROM for elbow, wrist and digits of operated UE: Independent;Patient able to  independently direct caregiver Sling wearing schedule (on at all times/off for ADL's): Independent;Patient able to independently direct caregiver Proper positioning of operated UE when showering: Supervision/safety;Patient able to independently direct caregiver Dressing change: Patient able to independently direct caregiver Positioning of UE while sleeping: Patient able to independently direct caregiver    Home Living Family/patient expects to be discharged to:: Private residence Living Arrangements: Spouse/significant other Available Help at Discharge: Family;Available PRN/intermittently Type of Home: House Home Access: Stairs to enter Entrance Stairs-Number of Steps: 1   Home Layout: Multi-level Alternate Level Stairs-Number of Steps: 3   Bathroom Shower/Tub: Teacher, early years/pre: Standard     Home Equipment: None          Prior Functioning/Environment Level of Independence: Independent                 OT Problem List: Impaired UE functional use;Pain         OT Goals(Current goals can be found in the care plan section) Acute Rehab OT Goals Patient Stated Goal: go home OT Goal Formulation: With patient Time For Goal Achievement: 07/26/19 Potential to Achieve Goals: Good   AM-PAC OT "6 Clicks" Daily Activity     Outcome Measure Help from another person eating meals?: None Help from another person taking care of personal grooming?: A Little Help from another person toileting, which includes using toliet, bedpan, or urinal?: A Little Help from another person bathing (including washing, rinsing, drying)?: A Little Help from another person to put on and taking off regular upper body clothing?: A Lot Help from another person to put on and taking off regular lower body clothing?: A Little 6 Click Score: 18   End of Session Nurse Communication: Mobility status  Activity Tolerance: Patient tolerated treatment well Patient left: in chair;with call  bell/phone within reach  OT Visit Diagnosis: Pain Pain - Right/Left: Left Pain - part of body: Shoulder                Time: 8887-5797 OT Time Calculation (min): 26 min Charges:  OT General Charges $OT Visit: 1 Visit OT Evaluation $OT Eval Moderate Complexity: 1 Mod  Delbert Phenix OT Pager: [place] 07/12/2019, 10:32 AM

## 2019-07-12 NOTE — Discharge Summary (Signed)
Orthopedic Discharge Summary        Physician Discharge Summary  Patient ID: Antonio [name] MRN: [id number] DOB/AGE: 80-25-1942 80 y.o.  Admit date: 07/11/2019 Discharge date: 07/12/2019   Procedures:  Procedure(s) (LRB): REVERSE SHOULDER ARTHROPLASTY (Left)  Attending Physician:  Dr. Esmond Plants  Admission Diagnoses:   Left shoulder rotator cuff tear arthropathy  Discharge Diagnoses:  same   Past Medical History:  Diagnosis Date  . Anemia    hx  . BPH (benign prostatic hyperplasia)   . 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 Chronic back pain   . Depression   . Diabetes mellitus without complication ([place])   . GERD (gastroesophageal reflux disease)   . History of carpal tunnel syndrome    Bilateral  . Hyperlipidemia    "borderline"  . Leg edema, right   . OSA on CPAP   . 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 Patient tolerated the procedure well and brought to the recovery room in good condition and subsequently to the floor. Patient had an uncomplicated hospital course and was stable for discharge.   Disposition: Discharge disposition: 01-Home or Self Care      with follow up in 2 weeks    Follow-up Information    Netta Cedars, MD. Call in 10 day(s).   Specialty: Orthopedic Surgery Why: [phone], Tuesday next Contact information: [address] [place] [address] [phone]               Discharge Instructions    Call MD / Call 911   Complete by: As directed    If you experience chest pain or shortness of breath, CALL 911 and be transported to the hospital emergency room.  If you develope a fever above 101 F, pus (white drainage) or increased drainage or redness at the wound, or calf pain, call your surgeon's office.   Constipation  Prevention   Complete by: As directed    Drink plenty of fluids.  Prune juice may be helpful.  You may use a stool softener, such as Colace (over the counter) 100 mg twice a day.  Use MiraLax (over the counter) for constipation as needed.   Diet - low sodium heart healthy   Complete by: As directed    Increase activity slowly as tolerated   Complete by: As directed       Allergies as of 07/12/2019   No Known Allergies     Medication List    TAKE these medications   cholecalciferol 1000 units tablet Commonly known as: VITAMIN D Take 1,000 Units by mouth in the morning and at bedtime.   DULoxetine 20 MG capsule Commonly known as: CYMBALTA Take 1 capsule (20 mg total) by mouth daily. What changed:   when to take this  Another medication with the same name was removed. Continue taking this medication, and follow the directions you see here.   famotidine 10 MG tablet Commonly known as: PEPCID Take 10 mg by mouth daily.   ferrous sulfate 325 (65 FE) MG tablet Take 325 mg by mouth daily with breakfast.   hydrochlorothiazide 25 MG tablet Commonly known as: HYDRODIURIL Take 25 mg by mouth daily.   HYDROcodone-acetaminophen 5-325 MG tablet Commonly known as: Norco Take 1 tablet by mouth every 6 (six) hours  as needed for moderate pain.   metFORMIN 500 MG 24 hr tablet Commonly known as: GLUCOPHAGE-XR Take 250 mg by mouth 2 (two) times daily with a meal.   methocarbamol 500 MG tablet Commonly known as: Robaxin Take 1 tablet (500 mg total) by mouth every 8 (eight) hours as needed for muscle spasms.   ondansetron 4 MG tablet Commonly known as: Zofran Take 1 tablet (4 mg total) by mouth every 8 (eight) hours as needed for nausea or vomiting.   potassium chloride SA 20 MEQ tablet Commonly known as: KLOR-CON Take 20 mEq by mouth daily.   tamsulosin 0.4 MG Caps capsule Commonly known as: FLOMAX Take 1 capsule (0.4 mg total) by mouth daily after breakfast.          Signed: Augustin Schooling 07/12/2019, 9:34 AM  [place] is now [facility] [address] [place], [place], [place] [address] Phone: [place]

## 2019-07-12 NOTE — Plan of Care (Signed)
Pt ready to DC home 

## 2019-07-13 ENCOUNTER — Other Ambulatory Visit: Payer: Self-pay | Admitting: Physician Assistant

## 2019-07-14 ENCOUNTER — Encounter (HOSPITAL_COMMUNITY): Payer: Self-pay | Admitting: Orthopedic Surgery

## 2019-07-25 ENCOUNTER — Other Ambulatory Visit: Payer: Self-pay

## 2019-07-25 ENCOUNTER — Ambulatory Visit (INDEPENDENT_AMBULATORY_CARE_PROVIDER_SITE_OTHER): Payer: Medicare Other | Admitting: Physician Assistant

## 2019-07-25 ENCOUNTER — Encounter: Payer: Self-pay | Admitting: Physician Assistant

## 2019-07-25 DIAGNOSIS — F319 Bipolar disorder, unspecified: Secondary | ICD-10-CM | POA: Diagnosis not present

## 2019-07-25 DIAGNOSIS — Z79899 Other long term (current) drug therapy: Secondary | ICD-10-CM | POA: Diagnosis not present

## 2019-07-25 MED ORDER — LAMOTRIGINE 25 MG PO TABS
ORAL_TABLET | ORAL | 1 refills | Status: DC
Start: 2019-07-25 — End: 2019-09-05

## 2019-07-25 MED ORDER — DIVALPROEX SODIUM ER 500 MG PO TB24: ORAL_TABLET | ORAL | 1 refills | Status: DC

## 2019-07-25 NOTE — Progress Notes (Signed)
[facility] Med Check  Patient ID: Antonio [name],  MRN: [id number]  PCP: Leeroy Cha, MD  Date of Evaluation: 07/25/2019 Time spent:45 minutes  Chief Complaint:  Chief Complaint    Medication Refill      HISTORY/CURRENT STATUS: HPI Here for routine med check. Wife, Antonio [name], is with him.   He's more irritable and angry. "I don't like being that way." Antonio [name] says he's downright mean. It's been getting worse over the last year, but even worse over the past few months.   Ever since 2007 when he retired, he hasn't wanted to do anything. Before then, he would do things around the house, and the yard was immaculate.  He only wants to sit around and watch TV.  And the least little thing can make him really irritable.  He has a very short fuse.  He does not cry easily.  He has no energy or motivation.  No suicidal or homicidal thoughts.  Patient denies increased energy with decreased need for sleep, no increased talkativeness, no racing thoughts, no impulsivity or risky behaviors, no increased libido, no grandiosity, and no hallucinations.  He and his wife are not certain whether he has had cyclical patterns with the depression and irritability.  But he has a collection of knives that he has had for a very long time, and sometimes he will increased spending for that.  He has also hidden that from his wife off and on for a long time.  He would spend a lot of money and not tell her.  They report no previous diagnosis of bipolar disorder, although Antonio [name] has wondered for a long time whether that could be the problem.  Denies muscle or joint pain, stiffness, or dystonia. Denies dizziness, syncope, seizures, numbness, tingling, tremor, tics, unsteady gait, slurred speech, confusion.   Individual Medical History/ Review of Systems: Changes? :Yes Had left shoulder replacement last week.    Past medications for mental health diagnoses include: Wellbutrin SR Wellbutrin XL, Zoloft caused  falls, Cymbalta  Allergies: Patient has no known allergies.  Current Medications:  Current Outpatient Medications:    cholecalciferol (VITAMIN D) 1000 units tablet, Take 1,000 Units by mouth in the morning and at bedtime. , Disp: , Rfl:    DULoxetine (CYMBALTA) 20 MG capsule, TAKE 1 CAPSULE BY MOUTH  DAILY, Disp: 90 capsule, Rfl: 0   famotidine (PEPCID) 10 MG tablet, Take 10 mg by mouth daily. , Disp: , Rfl:    ferrous sulfate 325 (65 FE) MG tablet, Take 325 mg by mouth daily with breakfast., Disp: , Rfl:    hydrochlorothiazide (HYDRODIURIL) 25 MG tablet, Take 25 mg by mouth daily., Disp: , Rfl:    metFORMIN (GLUCOPHAGE-XR) 500 MG 24 hr tablet, Take 250 mg by mouth 2 (two) times daily with a meal. , Disp: , Rfl: 2   potassium chloride SA (K-DUR,KLOR-CON) 20 MEQ tablet, Take 20 mEq by mouth daily. , Disp: , Rfl: 3   Tamsulosin HCl (FLOMAX) 0.4 MG CAPS, Take 1 capsule (0.4 mg total) by mouth daily after breakfast., Disp: 30 capsule, Rfl: 0   divalproex (DEPAKOTE ER) 500 MG 24 hr tablet, 1 po qhs for 4 nights, then increase to 2 po qhs., Disp: 60 tablet, Rfl: 1   HYDROcodone-acetaminophen (NORCO) 5-325 MG tablet, Take 1 tablet by mouth every 6 (six) hours as needed for moderate pain. (Patient not taking: Reported on 07/25/2019), Disp: 25 tablet, Rfl: 0   lamoTRIgine (LAMICTAL) 25 MG tablet, 1 po every other night for  2 weeks, then 1 po qhs., Disp: 60 tablet, Rfl: 1   methocarbamol (ROBAXIN) 500 MG tablet, Take 1 tablet (500 mg total) by mouth every 8 (eight) hours as needed for muscle spasms. (Patient not taking: Reported on 07/25/2019), Disp: 30 tablet, Rfl: 1   ondansetron (ZOFRAN) 4 MG tablet, Take 1 tablet (4 mg total) by mouth every 8 (eight) hours as needed for nausea or vomiting. (Patient not taking: Reported on 07/25/2019), Disp: 20 tablet, Rfl: 1 Medication Side Effects: none  Family Medical/ Social History: Changes? No  MENTAL HEALTH EXAM:  There were no vitals taken for this  visit.There is no height or weight on file to calculate BMI.  General Appearance: Casual, Neat, Well Groomed and Obese  Eye Contact:  Good  Speech:  Clear and Coherent  Volume:  Normal  Mood:  Euthymic  Affect:  Appropriate  Thought Process:  Goal Directed and Descriptions of Associations: Intact  Orientation:  Full (Time, Place, and Person)  Thought Content: Logical   Suicidal Thoughts:  No  Homicidal Thoughts:  No  Memory:  WNL  Judgement:  Good  Insight:  Good  Psychomotor Activity:  Normal  Concentration:  Concentration: Good  Recall:  Good  Fund of Knowledge: Good  Language: Good  Assets:  Desire for Improvement  ADL's:  Intact  Cognition: WNL  Prognosis:  Good    DIAGNOSES:    ICD-10-CM   1. Bipolar depression ([place])  F31.9   2. Encounter for long-term (current) use of medications  Z79.899 CBC with Differential/Platelet    Comprehensive metabolic panel    Valproic acid level    Receiving Psychotherapy: No    RECOMMENDATIONS:  PDMP was reviewed. I provided 45 minutes of face-to-face care during this encounter. After going back through his symptoms over the years, I believe he has bipolar disorder.  It can sometimes be very difficult to diagnose, especially when it is bipolar depression only, without mania.  It seems as though he has had mania in the past but not recently. We discussed different treatment options including mood stabilizers, lithium, and atypical antipsychotics.  Because his chief complaint is anger and irritability, I think using Depakote would be the best treatment.  I would also recommend adding Lamictal for the depression.  We will keep him on Cymbalta until probably the next visit and then we can wean off.  He is on a very low dose anyway. Counseled patient regarding potential benefits, risks, and side effects of Lamictal to include potential risk of Stevens-Johnson syndrome. Advised patient to stop taking Lamictal and contact office immediately if  rash develops and to seek urgent medical attention if rash is severe and/or spreading quickly.  Patient understands and accepts these risks. We discussed the benefits, risk and possible side effects of Depakote, including increased LFTs and pancreatitis.  He knows to contact us if he has abdominal pain, jaundice, itching, or dark yellow urine.  He and his wife understand and accept the risk. Labs to be done in approximately 7 to 10 days after he reaches 1000 mg of Depakote.  They verbalized understanding and know that he will need to have labs routinely because of the risk of LFTs with the Depakote. Start Depakote ER 500 mg, 1 p.o. nightly for 4 nights and then increase to 2 p.o. nightly. Start Lamictal 25 mg, 1 p.o. every other night for 2 weeks and then 1 p.o. nightly until next visit. Continue Cymbalta 20 mg, 1 p.o. daily. Labs ordered as above.  restart therapy with Dr. Rica Mote. Return in 6 weeks.  [name], PA-C

## 2019-07-30 ENCOUNTER — Encounter: Payer: Self-pay | Admitting: Endocrinology

## 2019-08-01 DIAGNOSIS — H6123 Impacted cerumen, bilateral: Secondary | ICD-10-CM | POA: Insufficient documentation

## 2019-08-01 DIAGNOSIS — T161XXA Foreign body in right ear, initial encounter: Secondary | ICD-10-CM | POA: Insufficient documentation

## 2019-08-15 ENCOUNTER — Other Ambulatory Visit: Payer: Self-pay | Admitting: Physician Assistant

## 2019-08-16 LAB — CBC WITH DIFFERENTIAL/PLATELET
Basophils Absolute: 0.1 10*3/uL (ref 0.0–0.2)
Basos: 1 %
EOS (ABSOLUTE): 0.2 10*3/uL (ref 0.0–0.4)
Eos: 2 %
Hematocrit: 37.1 % — ABNORMAL LOW (ref 37.5–51.0)
Hemoglobin: 12.4 g/dL — ABNORMAL LOW (ref 13.0–17.7)
Immature Grans (Abs): 0.1 10*3/uL (ref 0.0–0.1)
Immature Granulocytes: 1 %
Lymphocytes Absolute: 1 10*3/uL (ref 0.7–3.1)
Lymphs: 14 %
MCH: 30.6 pg (ref 26.6–33.0)
MCHC: 33.4 g/dL (ref 31.5–35.7)
MCV: 92 fL (ref 79–97)
Monocytes Absolute: 1 10*3/uL — ABNORMAL HIGH (ref 0.1–0.9)
Monocytes: 15 %
Neutrophils Absolute: 4.7 10*3/uL (ref 1.4–7.0)
Neutrophils: 67 %
Platelets: 221 10*3/uL (ref 150–450)
RBC: 4.05 x10E6/uL — ABNORMAL LOW (ref 4.14–5.80)
RDW: 11.8 % (ref 11.6–15.4)
WBC: 7 10*3/uL (ref 3.4–10.8)

## 2019-08-16 LAB — COMPREHENSIVE METABOLIC PANEL
ALT: 9 IU/L (ref 0–44)
AST: 7 IU/L (ref 0–40)
Albumin/Globulin Ratio: 1.3 (ref 1.2–2.2)
Albumin: 3.6 g/dL — ABNORMAL LOW (ref 3.7–4.7)
Alkaline Phosphatase: 133 IU/L — ABNORMAL HIGH (ref 48–121)
BUN/Creatinine Ratio: 17 (ref 10–24)
BUN: 20 mg/dL (ref 8–27)
Bilirubin Total: 0.2 mg/dL (ref 0.0–1.2)
CO2: 25 mmol/L (ref 20–29)
Calcium: 8.9 mg/dL (ref 8.6–10.2)
Chloride: 98 mmol/L (ref 96–106)
Creatinine, Ser: 1.2 mg/dL (ref 0.76–1.27)
GFR calc Af Amer: 66 mL/min/{1.73_m2} (ref >59)
GFR calc non Af Amer: 57 mL/min/{1.73_m2} — ABNORMAL LOW (ref >59)
Globulin, Total: 2.8 g/dL (ref 1.5–4.5)
Glucose: 233 mg/dL — ABNORMAL HIGH (ref 65–99)
Potassium: 4.5 mmol/L (ref 3.5–5.2)
Sodium: 141 mmol/L (ref 134–144)
Total Protein: 6.4 g/dL (ref 6.0–8.5)

## 2019-08-16 LAB — VALPROIC ACID LEVEL: Valproic Acid Lvl: 64 ug/mL (ref 50–100)

## 2019-08-18 ENCOUNTER — Encounter: Payer: Self-pay | Admitting: Physician Assistant

## 2019-08-18 NOTE — Progress Notes (Signed)
VPA level is 64 which is good.  Continue same dose of Depakote.Hemoglobin 12.4 which is slightly below normal which should be 13.0.  Hematocrit is 37.1, also slightly below normal which is 37.5.  Glucose was 233.  Kidney functions were normal.  Liver functions normal except  Alk Phos is 133.   He has known iron deficiency anemia as well as type 2 diabetes.  Please stress to him, or his wife, whoever you speak with, to see his PCP soon about these labs.

## 2019-08-20 NOTE — Progress Notes (Signed)
This is a duplicate, right?

## 2019-09-02 ENCOUNTER — Ambulatory Visit: Payer: Medicare Other | Admitting: Endocrinology

## 2019-09-05 ENCOUNTER — Ambulatory Visit (INDEPENDENT_AMBULATORY_CARE_PROVIDER_SITE_OTHER): Payer: Medicare Other | Admitting: Physician Assistant

## 2019-09-05 ENCOUNTER — Other Ambulatory Visit: Payer: Self-pay

## 2019-09-05 ENCOUNTER — Encounter: Payer: Self-pay | Admitting: Physician Assistant

## 2019-09-05 DIAGNOSIS — R7309 Other abnormal glucose: Secondary | ICD-10-CM | POA: Diagnosis not present

## 2019-09-05 DIAGNOSIS — F319 Bipolar disorder, unspecified: Secondary | ICD-10-CM

## 2019-09-05 MED ORDER — DIVALPROEX SODIUM ER 500 MG PO TB24: 1000.0000 mg | ORAL_TABLET | Freq: Every day | ORAL | 0 refills | Status: DC

## 2019-09-05 MED ORDER — LAMOTRIGINE 25 MG PO TABS: 25.0000 mg | ORAL_TABLET | Freq: Every day | ORAL | 0 refills | Status: DC

## 2019-09-05 NOTE — Progress Notes (Signed)
[facility]  Patient ID: Antonio [name],  MRN: [id number]  PCP: Leeroy Cha, MD  Date of Evaluation: 09/05/2019 Time spent:30 minutes  Chief Complaint:  Chief Complaint    Follow-up      HISTORY/CURRENT STATUS: HPI Here for routine med check.   At the last visit 6 weeks ago, we added Depakote and Lamictal due to increased anger and irritability and emotional outburst.  He was also having a lot of of problems with energy and motivation, for years.  Denies any "foggy headed" feelings since starting these meds. The only side effect that he has noticed since starting the medication is that he will fall asleep for about an hour when sitting on the couch during the day.  [name] states he is able to see a big difference in his behavior and emotions.  He does not have outburst as often.  And he is able to understand the situation that might warrant an outburst, but he is able to hold himself back from getting so angry and irritable at the drop of the hat. Patient denies increased energy with decreased need for sleep, no increased talkativeness, no racing thoughts, no impulsivity or risky behaviors, no increased libido, no grandiosity, and no hallucinations.  Reports feeling a lot better with energy and motivation.  He is now working in the yard more often.  He does enjoy things more.  He is not isolating.  Does not cry.  No change in memory or concentration.  But other than that there are no problems.  Denies suicidal or homicidal thoughts.  He ran out of Cymbalta 7 to 10 days ago.  He has had no withdrawals.  We had discussed discontinuing that anyway.  He asked if he needs to restart it.  Denies muscle or joint pain, stiffness, or dystonia. Denies dizziness, syncope, seizures, numbness, tingling, tremor, tics, unsteady gait, slurred speech, confusion.   Individual Medical History/ Review of Systems: Changes? :No    Past medications for mental health diagnoses  include: Wellbutrin SR Wellbutrin XL, Zoloft caused falls, Cymbalta  Allergies: Wellbutrin [bupropion] and Zoloft [sertraline]  Current Medications:  Current Outpatient Medications:  .  cholecalciferol (VITAMIN D) 1000 units tablet, Take 1,000 Units by mouth in the morning and at bedtime. , Disp: , Rfl:  .  divalproex (DEPAKOTE ER) 500 MG 24 hr tablet, Take 2 tablets (1,000 mg total) by mouth daily., Disp: 180 tablet, Rfl: 0 .  famotidine (PEPCID) 10 MG tablet, Take 10 mg by mouth daily. , Disp: , Rfl:  .  ferrous sulfate 325 (65 FE) MG tablet, Take 325 mg by mouth daily with breakfast., Disp: , Rfl:  .  hydrochlorothiazide (HYDRODIURIL) 25 MG tablet, Take 25 mg by mouth daily., Disp: , Rfl:  .  lamoTRIgine (LAMICTAL) 25 MG tablet, Take 1 tablet (25 mg total) by mouth daily., Disp: 90 tablet, Rfl: 0 .  metFORMIN (GLUCOPHAGE) 500 MG tablet, Take 500 mg by mouth 2 (two) times daily., Disp: , Rfl:  .  potassium chloride SA (K-DUR,KLOR-CON) 20 MEQ tablet, Take 20 mEq by mouth daily. , Disp: , Rfl: 3 .  Tamsulosin HCl (FLOMAX) 0.4 MG CAPS, Take 1 capsule (0.4 mg total) by mouth daily after breakfast., Disp: 30 capsule, Rfl: 0 .  HYDROcodone-acetaminophen (NORCO) 5-325 MG tablet, Take 1 tablet by mouth every 6 (six) hours as needed for moderate pain. (Patient not taking: Reported on 07/25/2019), Disp: 25 tablet, Rfl: 0 .  methocarbamol (ROBAXIN) 500 MG tablet, Take 1 tablet (  500 mg total) by mouth every 8 (eight) hours as needed for muscle spasms. (Patient not taking: Reported on 07/25/2019), Disp: 30 tablet, Rfl: 1 .  ondansetron (ZOFRAN) 4 MG tablet, Take 1 tablet (4 mg total) by mouth every 8 (eight) hours as needed for nausea or vomiting. (Patient not taking: Reported on 07/25/2019), Disp: 20 tablet, Rfl: 1 Medication Side Effects: Mild increase of drowsiness.  Family Medical/ Social History: Changes? No  MENTAL HEALTH EXAM:  There were no vitals taken for this visit.There is no height or weight on  file to calculate BMI.  General Appearance: Casual, Neat, Well Groomed and Obese  Eye Contact:  Good  Speech:  Clear and Coherent  Volume:  Normal  Mood:  Euthymic  Affect:  Appropriate  Thought Process:  Goal Directed and Descriptions of Associations: Intact  Orientation:  Full (Time, Place, and Person)  Thought Content: Logical   Suicidal Thoughts:  No  Homicidal Thoughts:  No  Memory:  WNL  Judgement:  Good  Insight:  Good  Psychomotor Activity:  Normal  Concentration:  Concentration: Good  Recall:  Good  Fund of Knowledge: Good  Language: Good  Assets:  Desire for Improvement  ADL's:  Intact  Cognition: WNL  Prognosis:  Good   08/15/2019 CBC was normal except hemoglobin of 12.4 and hematocrit 37.1. Glucose was 233 (known diabetic), kidney functions were normal, LFTs were normal except alkaline phosphatase was slightly elevated at 133. Depakote level was 64 which is good.   DIAGNOSES:    ICD-10-CM   1. Bipolar depression ([place])  F31.9   2. Elevated glucose  R73.09     Receiving Psychotherapy: No    RECOMMENDATIONS:  PDMP was reviewed. I provided 30 minutes of face-to-face time during this encounter, including reviewing labs obtained since the last visit. I am glad to see him doing better. Lab results were discussed with him.  See PCP for slightly low hemoglobin and hematocrit.  He has an appointment with an endocrinologist next week for the diabetes.   We discussed whether the Cymbalta is beneficial or not.  He has been on such a low dose that I doubt he is getting any response at all, and since he has been off of it for a week at least already and has had no withdrawals or increased depression I think we can stop it for good, at least for now. Cont Lamictal 25 mg qhs. Continue Depakote ER 500 mg p.o. qhs. D/C Cymbalta (he already has) At the next visit I will order Depakote level to be done sometime in October. Return in 2 months.  [name], PA-C

## 2019-09-09 ENCOUNTER — Encounter: Payer: Self-pay | Admitting: Endocrinology

## 2019-09-09 ENCOUNTER — Other Ambulatory Visit: Payer: Self-pay

## 2019-09-09 ENCOUNTER — Ambulatory Visit: Payer: Medicare Other | Admitting: Endocrinology

## 2019-09-09 VITALS — BP 124/70 | HR 89 | Ht 64.25 in | Wt 146.2 lb

## 2019-09-09 DIAGNOSIS — E1122 Type 2 diabetes mellitus with diabetic chronic kidney disease: Secondary | ICD-10-CM

## 2019-09-09 DIAGNOSIS — E119 Type 2 diabetes mellitus without complications: Secondary | ICD-10-CM

## 2019-09-09 DIAGNOSIS — N183 Chronic kidney disease, stage 3 unspecified: Secondary | ICD-10-CM | POA: Diagnosis not present

## 2019-09-09 LAB — POCT GLYCOSYLATED HEMOGLOBIN (HGB A1C): Hemoglobin A1C: 7.5 % — AB (ref 4.0–5.6)

## 2019-09-09 MED ORDER — RYBELSUS 3 MG PO TABS: 3.0000 mg | ORAL_TABLET | Freq: Every day | ORAL | 11 refills | Status: DC

## 2019-09-09 MED ORDER — METFORMIN HCL ER 500 MG PO TB24
500.0000 mg | ORAL_TABLET | Freq: Every day | ORAL | 3 refills | Status: DC
Start: 2019-09-09 — End: 2019-11-10

## 2019-09-09 NOTE — Patient Instructions (Addendum)
good diet and exercise significantly improve the control of your diabetes.  please let me know if you wish to be referred to a dietician.  high blood sugar is very risky to your health.  you should see an eye doctor and dentist every year.  It is very important to get all recommended vaccinations.  Controlling your blood pressure and cholesterol drastically reduces the damage diabetes does to your body.  Those who smoke should quit.  Please discuss these with your doctor. check your blood sugar once a day.  vary the time of day when you check, between before the 3 meals, and at bedtime.  also check if you have symptoms of your blood sugar being too high or too low.  please keep a record of the readings and bring it to your next appointment here (or you can bring the meter itself).  You can write it on any piece of paper.  please call us sooner if your blood sugar goes below 70, or if you have a lot of readings over 200.  Please reduce the metformin to 1 pill per day, and: I have sent a prescription to your pharmacy, to add "Rybelsus." Please come back for a follow-up appointment in 2 months.

## 2019-09-09 NOTE — Progress Notes (Signed)
Subjective:    Patient ID: Antonio [name], male    DOB: 01/02/1941, 79 y.o.   MRN: [id number]  HPI pt is referred by Dr Fara Olden, for diabetes.  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 She takes metformin only.  He brings a record of his cbg's which I have reviewed today.  cbg's vary from 117-213.   Past Medical History:  Diagnosis Date  . Anemia    hx  . BPH (benign prostatic hyperplasia)   . 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 Chronic back pain   . Depression   . Diabetes mellitus without complication ([place])   . GERD (gastroesophageal reflux disease)   . History of carpal tunnel syndrome    Bilateral  . Hyperlipidemia    "borderline"  . Leg edema, right   . OSA on CPAP   . 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 CARPAL TUNNEL RELEASE Bilateral   . LUMBAR LAMINECTOMY/DECOMPRESSION MICRODISCECTOMY  09/06/2011   Procedure: LUMBAR LAMINECTOMY/DECOMPRESSION MICRODISCECTOMY 1 LEVEL;  Surgeon: Hosie Spangle, MD;  Location: [place] [facility];  Service: Neurosurgery;  Laterality: Left;  Left lumbar three-four Lumbar Laminotomy/microdiskectomy  . LUMBAR LAMINECTOMY/DECOMPRESSION MICRODISCECTOMY Left 12/26/2012   Procedure: LUMBAR LAMINECTOMY/DECOMPRESSION MICRODISCECTOMY LEFT LUMBAR THREE-FOUR ;  Surgeon: Hosie Spangle, MD;  Location: [place] [facility];  Service: Neurosurgery;  Laterality: Left;  . POLYPECTOMY     removed small intestine  . REVERSE SHOULDER ARTHROPLASTY Left 07/11/2019   Procedure: REVERSE SHOULDER ARTHROPLASTY;  Surgeon: Netta Cedars, MD;  Location: [facility];  Service: Orthopedics;  Laterality: Left;  interscalene block  . ROTATOR CUFF REPAIR Left   . TONSILLECTOMY      Social History   Socioeconomic  History  . Marital status: Married    Spouse name: Not on file  . Number of children: Not on file  . Years of education: Not on file  . Highest education level: Not on file  Occupational History  . Occupation: retired    Comment: Paediatric nurse  Tobacco Use  . Smoking status: Never Smoker  . Smokeless tobacco: Never Used  Vaping Use  . Vaping Use: Never used  Substance and Sexual Activity  . Alcohol use: No  . Drug use: No  . Sexual activity: Not on file  Other Topics Concern  . Not on file  Social History Narrative   Lives with wife in a tri-level home.  Has 3 children.  Retired from [employer].  Education: high school.   Social Determinants of Health   Financial Resource Strain:   . Difficulty of Paying Living Expenses: Not on file  Food Insecurity:   . Worried About Charity fundraiser in the Last Year: Not on file  . Ran Out of Food in the Last Year: Not on file  Transportation Needs:   . Lack of Transportation (Medical): Not on file  . Lack of Transportation (Non-Medical): Not on file  Physical Activity:   . Days of Exercise per Week: Not on file  . Minutes of Exercise per Session: Not on file  Stress:   . Feeling of Stress : Not on file  Social Connections:   . Frequency  of Communication with Friends and Family: Not on file  . Frequency of Social Gatherings with Friends and Family: Not on file  . Attends Religious Services: Not on file  . Active Member of Clubs or Organizations: Not on file  . Attends Archivist Meetings: Not on file  . Marital Status: Not on file  Intimate Partner Violence:   . Fear of Current or Ex-Partner: Not on file  . Emotionally Abused: Not on file  . Physically Abused: Not on file  . Sexually Abused: Not on file    Current Outpatient Medications on File Prior to Visit  Medication Sig Dispense Refill  . cholecalciferol (VITAMIN D) 1000 units tablet Take 1,000 Units by mouth in the morning and at bedtime.      . divalproex (DEPAKOTE ER) 500 MG 24 hr tablet Take 2 tablets (1,000 mg total) by mouth daily. 180 tablet 0  . famotidine (PEPCID) 10 MG tablet Take 10 mg by mouth daily.     . ferrous sulfate 325 (65 FE) MG tablet Take 325 mg by mouth daily with breakfast.    . hydrochlorothiazide (HYDRODIURIL) 25 MG tablet Take 25 mg by mouth daily.    Marland Kitchen lamoTRIgine (LAMICTAL) 25 MG tablet Take 1 tablet (25 mg total) by mouth daily. 90 tablet 0  . potassium chloride SA (K-DUR,KLOR-CON) 20 MEQ tablet Take 20 mEq by mouth daily.   3  . Tamsulosin HCl (FLOMAX) 0.4 MG CAPS Take 1 capsule (0.4 mg total) by mouth daily after breakfast. 30 capsule 0   No current facility-administered medications on file prior to visit.    Allergies  Allergen Reactions  . Metformin And Related Diarrhea  . Wellbutrin [Bupropion] Other (See Comments)    Mental fogginess per patient  . Zoloft [Sertraline] Other (See Comments)    Mental fogginess per patient    Family History  Problem Relation Age of Onset  . Colon cancer Mother   . COPD Father   . Diabetes Mellitus I Brother   . Hypertension Daughter     BP 124/70   Pulse 89   Ht 5' 4.25" (1.632 m)   Wt 146 lb 3.2 oz (66.3 kg)   SpO2 97%   BMI 24.90 kg/m     Review of Systems denies weight loss, blurry vision, sob, n/v, urinary frequency, and memory loss.  depression is well-controlled.       Objective:   Physical Exam VITAL SIGNS:  See vs page GENERAL: no distress Pulses: dorsalis pedis intact bilat.   MSK: no deformity of the feet CV: 1+ bilat leg edema, and bilat vv's.   Skin:  no ulcer on the feet.  normal color and temp on the feet. Neuro: sensation is intact to touch on the feet  Lab Results  Component Value Date   CREATININE 1.20 08/15/2019   BUN 20 08/15/2019   NA 141 08/15/2019   K 4.5 08/15/2019   CL 98 08/15/2019   CO2 25 08/15/2019   Lab Results  Component Value Date   HGBA1C 7.5 (A) 09/09/2019   I have reviewed outside records,  and summarized: Pt was noted to have elevated A1c, and referred here.  HTN, and spinal OA were also addressed.       Assessment & Plan:  Type 2 DM, with stage 3 CRI: uncertain etiology   Patient Instructions  good diet and exercise significantly improve the control of your diabetes.  please let me know if you wish to be referred to  a dietician.  high blood sugar is very risky to your health.  you should see an eye doctor and dentist every year.  It is very important to get all recommended vaccinations.  Controlling your blood pressure and cholesterol drastically reduces the damage diabetes does to your body.  Those who smoke should quit.  Please discuss these with your doctor. check your blood sugar once a day.  vary the time of day when you check, between before the 3 meals, and at bedtime.  also check if you have symptoms of your blood sugar being too high or too low.  please keep a record of the readings and bring it to your next appointment here (or you can bring the meter itself).  You can write it on any piece of paper.  please call us sooner if your blood sugar goes below 70, or if you have a lot of readings over 200.  Please reduce the metformin to 1 pill per day, and: I have sent a prescription to your pharmacy, to add "Rybelsus." Please come back for a follow-up appointment in 2 months.

## 2019-09-13 DIAGNOSIS — E119 Type 2 diabetes mellitus without complications: Secondary | ICD-10-CM | POA: Insufficient documentation

## 2019-09-24 ENCOUNTER — Encounter: Payer: Self-pay | Admitting: Endocrinology

## 2019-11-05 ENCOUNTER — Ambulatory Visit: Payer: Medicare Other | Admitting: Physician Assistant

## 2019-11-06 ENCOUNTER — Other Ambulatory Visit: Payer: Self-pay | Admitting: Physician Assistant

## 2019-11-07 NOTE — Telephone Encounter (Signed)
Please review

## 2019-11-10 ENCOUNTER — Other Ambulatory Visit: Payer: Self-pay

## 2019-11-10 ENCOUNTER — Ambulatory Visit: Payer: Medicare Other | Admitting: Endocrinology

## 2019-11-10 ENCOUNTER — Encounter: Payer: Self-pay | Admitting: Endocrinology

## 2019-11-10 VITALS — BP 128/74 | HR 62 | Ht 64.25 in | Wt 146.0 lb

## 2019-11-10 DIAGNOSIS — E1122 Type 2 diabetes mellitus with diabetic chronic kidney disease: Secondary | ICD-10-CM | POA: Diagnosis not present

## 2019-11-10 DIAGNOSIS — N1831 Chronic kidney disease, stage 3a: Secondary | ICD-10-CM

## 2019-11-10 LAB — POCT GLYCOSYLATED HEMOGLOBIN (HGB A1C): Hemoglobin A1C: 6.9 % — AB (ref 4.0–5.6)

## 2019-11-10 MED ORDER — METFORMIN HCL 500 MG PO TABS: 250.0000 mg | ORAL_TABLET | Freq: Two times a day (BID) | ORAL | 3 refills | Status: DC

## 2019-11-10 NOTE — Progress Notes (Signed)
Subjective:    Patient ID: Antonio [name], male    DOB: 02/12/1940, 79 y.o.   MRN: [id number]  HPI Pt returns for f/u of diabetes mellitus: DM type: 2 Dx'ed: 6387 Complications: stage 3 CRI Therapy: metformin DKA: never Severe hypoglycemia: never Pancreatitis: never Pancreatic imaging: CT (2013): no mention is made of the pancreas SDOH: none Other: he has never been on insulin Interval history: He did not tolerate Rybelsus (nausea).  He brings a record of his cbg's which I have reviewed today.  All are in the low to mid-100's.   Past Medical History:  Diagnosis Date  . Anemia    hx  . BPH (benign prostatic hyperplasia)   . 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 Chronic back pain   . Depression   . Diabetes mellitus without complication ([place])   . GERD (gastroesophageal reflux disease)   . History of carpal tunnel syndrome    Bilateral  . Hyperlipidemia    "borderline"  . Leg edema, right   . OSA on CPAP   . 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 CARPAL TUNNEL RELEASE Bilateral   . LUMBAR LAMINECTOMY/DECOMPRESSION MICRODISCECTOMY  09/06/2011   Procedure: LUMBAR LAMINECTOMY/DECOMPRESSION MICRODISCECTOMY 1 LEVEL;  Surgeon: Hosie Spangle, MD;  Location: [place] [facility];  Service: Neurosurgery;  Laterality: Left;  Left lumbar three-four Lumbar Laminotomy/microdiskectomy  . LUMBAR LAMINECTOMY/DECOMPRESSION MICRODISCECTOMY Left 12/26/2012   Procedure: LUMBAR LAMINECTOMY/DECOMPRESSION MICRODISCECTOMY LEFT LUMBAR THREE-FOUR ;  Surgeon: Hosie Spangle, MD;  Location: [place] [facility];  Service: Neurosurgery;  Laterality: Left;  . POLYPECTOMY     removed small intestine  . REVERSE SHOULDER ARTHROPLASTY Left 07/11/2019   Procedure: REVERSE SHOULDER ARTHROPLASTY;  Surgeon: Netta Cedars, MD;  Location: [facility];  Service: Orthopedics;  Laterality: Left;  interscalene block  . ROTATOR CUFF REPAIR  Left   . TONSILLECTOMY      Social History   Socioeconomic History  . Marital status: Married    Spouse name: Not on file  . Number of children: Not on file  . Years of education: Not on file  . Highest education level: Not on file  Occupational History  . Occupation: retired    Comment: Paediatric nurse  Tobacco Use  . Smoking status: Never Smoker  . Smokeless tobacco: Never Used  Vaping Use  . Vaping Use: Never used  Substance and Sexual Activity  . Alcohol use: No  . Drug use: No  . Sexual activity: Not on file  Other Topics Concern  . Not on file  Social History Narrative   Lives with wife in a tri-level home.  Has 3 children.  Retired from [employer].  Education: high school.   Social Determinants of Health   Financial Resource Strain:   . Difficulty of Paying Living Expenses: Not on file  Food Insecurity:   . Worried About Charity fundraiser in the Last Year: Not on file  . Ran Out of Food in the Last Year: Not on file  Transportation Needs:   . Lack of Transportation (Medical): Not on file  . Lack of Transportation (Non-Medical): Not on file  Physical Activity:   . Days of Exercise per Week: Not on file  . Minutes of Exercise per Session: Not on file  Stress:   . Feeling of Stress : Not on file  Social Connections:   .  Frequency of Communication with Friends and Family: Not on file  . Frequency of Social Gatherings with Friends and Family: Not on file  . Attends Religious Services: Not on file  . Active Member of Clubs or Organizations: Not on file  . Attends Archivist Meetings: Not on file  . Marital Status: Not on file  Intimate Partner Violence:   . Fear of Current or Ex-Partner: Not on file  . Emotionally Abused: Not on file  . Physically Abused: Not on file  . Sexually Abused: Not on file    Current Outpatient Medications on File Prior to Visit  Medication Sig Dispense Refill  . cholecalciferol (VITAMIN D) 1000 units  tablet Take 1,000 Units by mouth in the morning and at bedtime.     . divalproex (DEPAKOTE ER) 500 MG 24 hr tablet Take 2 tablets (1,000 mg total) by mouth daily. 180 tablet 0  . famotidine (PEPCID) 10 MG tablet Take 10 mg by mouth daily.     . ferrous sulfate 325 (65 FE) MG tablet Take 325 mg by mouth daily with breakfast.    . hydrochlorothiazide (HYDRODIURIL) 25 MG tablet Take 25 mg by mouth daily.    Marland Kitchen lamoTRIgine (LAMICTAL) 25 MG tablet TAKE 1 TABLET BY MOUTH  DAILY 90 tablet 0  . potassium chloride SA (K-DUR,KLOR-CON) 20 MEQ tablet Take 20 mEq by mouth daily.   3  . Tamsulosin HCl (FLOMAX) 0.4 MG CAPS Take 1 capsule (0.4 mg total) by mouth daily after breakfast. 30 capsule 0   No current facility-administered medications on file prior to visit.    Allergies  Allergen Reactions  . Metformin And Related Diarrhea  . Wellbutrin [Bupropion] Other (See Comments)    Mental fogginess per patient  . Zoloft [Sertraline] Other (See Comments)    Mental fogginess per patient    Family History  Problem Relation Age of Onset  . Colon cancer Mother   . COPD Father   . Diabetes Mellitus I Brother   . Hypertension Daughter     BP 128/74   Pulse 62   Ht 5' 4.25" (1.632 m)   Wt 146 lb (66.2 kg)   SpO2 99%   BMI 24.87 kg/m    Review of Systems     Objective:   Physical Exam VITAL SIGNS:  See vs page GENERAL: no distress Pulses: dorsalis pedis intact bilat.   MSK: no deformity of the feet CV: 1+ right and trace left leg edema.  bilat vv's.   Skin:  no ulcer on the feet.  normal color and temp on the feet. Neuro: sensation is intact to touch on the feet.     Lab Results  Component Value Date   HGBA1C 6.9 (A) 11/10/2019   Lab Results  Component Value Date   CREATININE 1.20 08/15/2019   BUN 20 08/15/2019   NA 141 08/15/2019   K 4.5 08/15/2019   CL 98 08/15/2019   CO2 25 08/15/2019      Assessment & Plan:  Type 2 DM: well-controlled   Patient Instructions  Please  continue the same metformin, and: check your blood sugar once a day.  vary the time of day when you check, between before the 3 meals, and at bedtime.  also check if you have symptoms of your blood sugar being too high or too low.  please keep a record of the readings and bring it to your next appointment here (or you can bring the meter itself).  You  can write it on any piece of paper.  please call us sooner if your blood sugar goes below 70, or if you have a lot of readings over 200. Please come back for a follow-up appointment in 3 months.

## 2019-11-10 NOTE — Patient Instructions (Addendum)
Please continue the same metformin, and: check your blood sugar once a day.  vary the time of day when you check, between before the 3 meals, and at bedtime.  also check if you have symptoms of your blood sugar being too high or too low.  please keep a record of the readings and bring it to your next appointment here (or you can bring the meter itself).  You can write it on any piece of paper.  please call us sooner if your blood sugar goes below 70, or if you have a lot of readings over 200. Please come back for a follow-up appointment in 3 months.

## 2019-11-23 ENCOUNTER — Other Ambulatory Visit: Payer: Self-pay | Admitting: Physician Assistant

## 2019-12-05 ENCOUNTER — Ambulatory Visit (INDEPENDENT_AMBULATORY_CARE_PROVIDER_SITE_OTHER): Payer: Medicare Other | Admitting: Physician Assistant

## 2019-12-05 ENCOUNTER — Encounter: Payer: Self-pay | Admitting: Physician Assistant

## 2019-12-05 ENCOUNTER — Other Ambulatory Visit: Payer: Self-pay

## 2019-12-05 DIAGNOSIS — Z79899 Other long term (current) drug therapy: Secondary | ICD-10-CM

## 2019-12-05 DIAGNOSIS — F319 Bipolar disorder, unspecified: Secondary | ICD-10-CM

## 2019-12-05 DIAGNOSIS — R454 Irritability and anger: Secondary | ICD-10-CM | POA: Diagnosis not present

## 2019-12-05 MED ORDER — DIVALPROEX SODIUM ER 500 MG PO TB24: 1500.0000 mg | ORAL_TABLET | Freq: Every day | ORAL | 0 refills | Status: DC

## 2019-12-05 MED ORDER — LAMOTRIGINE 25 MG PO TABS: 50.0000 mg | ORAL_TABLET | Freq: Every day | ORAL | 0 refills | Status: DC

## 2019-12-05 NOTE — Patient Instructions (Signed)
On the Lamictal 25 mg, increase to 2 pills every night. On the Depakote ER 500 mg, increased to 3 pills every night

## 2019-12-05 NOTE — Progress Notes (Signed)
[facility]  Patient ID: Antonio [name],  MRN: [id number]  PCP: Leeroy Cha, MD  Date of Evaluation: 12/05/2019 Time spent:30 minutes  Chief Complaint:  Chief Complaint    Anxiety; Depression      HISTORY/CURRENT STATUS: HPI Here for routine med check. Wife, [name], is with him.  He's still very irritable. Not having outbursts like he was but he's negative all the time, angry at the world for no reason. No increased energy, no decreased need for sleep, no impulsivity, no risky behaviors, no grandiosity, no increased spending, no paranoia, no AH/VH.  Still puts things off. Wants to sleep all the time, energy and motivation are very low and does not enjoy anything.  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  He does not cry easily.  He does isolate.  [name] reports that his children have even commented on his negativity and never being happy about anything.  Denies anxiety.  Denies suicidal or homicidal thoughts.  He is being treated by his PCP for anemia and endocrinology is treating him for diabetes.  It was well controlled on metformin at last visit in October.  Denies muscle or joint pain, stiffness, or dystonia. Denies dizziness, syncope, seizures, numbness, tingling, tremor, tics, unsteady gait, slurred speech, confusion.   Individual Medical History/ Review of Systems: Changes? :No    Past medications for mental health diagnoses include: Wellbutrin SR Wellbutrin XL, Zoloft caused falls, Cymbalta  Allergies: Metformin and related, Wellbutrin [bupropion], and Zoloft [sertraline]  Current Medications:  Current Outpatient Medications:  .  cholecalciferol (VITAMIN D) 1000 units tablet, Take 1,000 Units by mouth in the morning and at bedtime. , Disp: , Rfl:  .  divalproex (DEPAKOTE ER) 500 MG 24 hr tablet, Take 3 tablets (1,500 mg total) by mouth daily., Disp: 180 tablet, Rfl: 0 .   famotidine (PEPCID) 10 MG tablet, Take 10 mg by mouth daily. , Disp: , Rfl:  .  ferrous sulfate 325 (65 FE) MG tablet, Take 325 mg by mouth daily with breakfast., Disp: , Rfl:  .  hydrochlorothiazide (HYDRODIURIL) 25 MG tablet, Take 25 mg by mouth daily., Disp: , Rfl:  .  lamoTRIgine (LAMICTAL) 25 MG tablet, Take 2 tablets (50 mg total) by mouth daily., Disp: 90 tablet, Rfl: 0 .  potassium chloride SA (K-DUR,KLOR-CON) 20 MEQ tablet, Take 20 mEq by mouth daily. , Disp: , Rfl: 3 .  Tamsulosin HCl (FLOMAX) 0.4 MG CAPS, Take 1 capsule (0.4 mg total) by mouth daily after breakfast., Disp: 30 capsule, Rfl: 0 .  metFORMIN (GLUCOPHAGE) 500 MG tablet, Take 0.5 tablets (250 mg total) by mouth 2 (two) times daily with a meal. (Patient not taking: Reported on 12/05/2019), Disp: 45 tablet, Rfl: 3 Medication Side Effects: Mild increase of drowsiness.  Family Medical/ Social History: Changes? No  MENTAL HEALTH EXAM:  There were no vitals taken for this visit.There is no height or weight on file to calculate BMI.  General Appearance: Casual, Neat, Well Groomed and Obese  Eye Contact:  Good  Speech:  Clear and Coherent  Volume:  Normal  Mood:  Irritable  Affect:  Congruent  Thought Process:  Goal Directed and Descriptions of Associations: Intact  Orientation:  Full (Time, Place, and Person)  Thought Content: Logical   Suicidal Thoughts:  No  Homicidal Thoughts:  No  Memory:  WNL  Judgement:  Good  Insight:  Good  Psychomotor Activity:  Normal  Concentration:  Concentration: Good  Recall:  Good  Fund of Knowledge: Good  Language: Good  Assets:  Desire for Improvement  ADL's:  Intact  Cognition: WNL  Prognosis:  Good   08/15/2019 CBC was normal except hemoglobin of 12.4 and hematocrit 37.1. Glucose was 233 (known diabetic), kidney functions were normal, LFTs were normal except alkaline phosphatase was slightly elevated at 133. Depakote level was 64 which is good.   DIAGNOSES:    ICD-10-CM    1. Irritability and anger  R45.4   2. Encounter for long-term (current) use of medications  Z79.899 Valproic acid level    CBC with Differential/Platelet    Comprehensive metabolic panel  3. 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 I provided 30 minutes of face-to-face time during this encounter, including reviewing labs obtained since the last visit. We discussed the irritability and anger in different treatment options for it.  Since he is already on Depakote, which is a good treatment for that, I recommend that we increase that dose.  He will need labs in approximately 1 week.  I do believe he continues to be depressed and he is on an extremely low dose of Lamictal that is usually not therapeutic for depression so I will increase that dose as well.  He and [name] know to watch for rash and call if he has any problems. I strongly recommend counseling.  He has seen a counselor in the past but it has been a long time. Increase Lamictal 25 mg to 2 p.o. nightly. Increase Depakote ER 500 mg to 3 pills nightly. Labs ordered as above to be drawn in approximately 1 week. Refer to first available counselor here in the office. Return in 6 weeks.   [name], PA-C

## 2019-12-15 ENCOUNTER — Encounter: Payer: Self-pay | Admitting: Endocrinology

## 2019-12-17 ENCOUNTER — Telehealth: Payer: Self-pay | Admitting: Physician Assistant

## 2019-12-17 NOTE — Telephone Encounter (Signed)
Noted  

## 2019-12-17 NOTE — Telephone Encounter (Signed)
Please give him a call to ask about his symptoms.  He sent a message through the portal stating that he was dizzy and stumbling, I believe, and thinks it may be due to the medication.  He had mentioned that we could talk about it at the next visit, but I do not want to wait that long.  I would recommend he see his PCP or go to urgent care regardless, he needs to be evaluated for a medical cause of this, especially at his age.  I do not want to just assume it is due to the psych medications.

## 2019-12-17 NOTE — Telephone Encounter (Signed)
I've tried twice to reach him, the number I call an older male answers and I announce who I am and who I'm trying to reach and she has hung up on me twice. I'm not sure the issue.

## 2019-12-22 NOTE — Telephone Encounter (Signed)
Wife called and said that Antonio [name] has stopped taking the medicine cold [place] and would like for you to give him a call back at [phone]. Has stumbled twice and was caught by his wife

## 2019-12-22 NOTE — Telephone Encounter (Signed)
Rtc to wife and she reports after an increase in his Lamictal and Depakote his dizziness increased. He became off balance and has fallen several times. He stopped the medication about 5 days ago per wife. They didn't know which was causing the symptoms. He reports he was having dizziness prior to the increase in medication, but his balance was not off. Informed her I would inform Helene Kelp with the information and call back with a recommendation.

## 2019-12-23 ENCOUNTER — Other Ambulatory Visit: Payer: Self-pay | Admitting: Physician Assistant

## 2019-12-23 NOTE — Telephone Encounter (Signed)
I still want him to see his PCP. It could be coincidental that this happened at the same time we started these meds. I don't want to add anything else until he's cleared by his PCP.

## 2019-12-30 ENCOUNTER — Encounter: Payer: Self-pay | Admitting: Surgery

## 2019-12-30 ENCOUNTER — Ambulatory Visit: Payer: Self-pay | Admitting: Surgery

## 2019-12-30 DIAGNOSIS — G4710 Hypersomnia, unspecified: Secondary | ICD-10-CM | POA: Insufficient documentation

## 2019-12-30 DIAGNOSIS — F320 Major depressive disorder, single episode, mild: Secondary | ICD-10-CM | POA: Insufficient documentation

## 2019-12-30 DIAGNOSIS — F3340 Major depressive disorder, recurrent, in remission, unspecified: Secondary | ICD-10-CM | POA: Insufficient documentation

## 2019-12-30 DIAGNOSIS — D509 Iron deficiency anemia, unspecified: Secondary | ICD-10-CM | POA: Insufficient documentation

## 2019-12-30 DIAGNOSIS — R413 Other amnesia: Secondary | ICD-10-CM | POA: Insufficient documentation

## 2019-12-30 DIAGNOSIS — K4090 Unilateral inguinal hernia, without obstruction or gangrene, not specified as recurrent: Secondary | ICD-10-CM | POA: Insufficient documentation

## 2019-12-30 DIAGNOSIS — N529 Male erectile dysfunction, unspecified: Secondary | ICD-10-CM | POA: Insufficient documentation

## 2019-12-30 DIAGNOSIS — K4020 Bilateral inguinal hernia, without obstruction or gangrene, not specified as recurrent: Secondary | ICD-10-CM

## 2019-12-30 DIAGNOSIS — T24209A Burn of second degree of unspecified site of unspecified lower limb, except ankle and foot, initial encounter: Secondary | ICD-10-CM | POA: Insufficient documentation

## 2019-12-30 DIAGNOSIS — I872 Venous insufficiency (chronic) (peripheral): Secondary | ICD-10-CM | POA: Insufficient documentation

## 2019-12-30 DIAGNOSIS — I10 Essential (primary) hypertension: Secondary | ICD-10-CM | POA: Insufficient documentation

## 2019-12-30 DIAGNOSIS — M545 Low back pain, unspecified: Secondary | ICD-10-CM | POA: Insufficient documentation

## 2019-12-30 DIAGNOSIS — F3342 Major depressive disorder, recurrent, in full remission: Secondary | ICD-10-CM | POA: Insufficient documentation

## 2019-12-30 DIAGNOSIS — H903 Sensorineural hearing loss, bilateral: Secondary | ICD-10-CM | POA: Insufficient documentation

## 2019-12-30 DIAGNOSIS — R269 Unspecified abnormalities of gait and mobility: Secondary | ICD-10-CM | POA: Insufficient documentation

## 2019-12-30 DIAGNOSIS — N400 Enlarged prostate without lower urinary tract symptoms: Secondary | ICD-10-CM | POA: Insufficient documentation

## 2019-12-30 DIAGNOSIS — F32A Depression, unspecified: Secondary | ICD-10-CM | POA: Insufficient documentation

## 2019-12-30 DIAGNOSIS — E559 Vitamin D deficiency, unspecified: Secondary | ICD-10-CM | POA: Insufficient documentation

## 2019-12-30 DIAGNOSIS — G4733 Obstructive sleep apnea (adult) (pediatric): Secondary | ICD-10-CM | POA: Insufficient documentation

## 2019-12-30 DIAGNOSIS — E1121 Type 2 diabetes mellitus with diabetic nephropathy: Secondary | ICD-10-CM | POA: Insufficient documentation

## 2019-12-30 DIAGNOSIS — M5136 Other intervertebral disc degeneration, lumbar region: Secondary | ICD-10-CM | POA: Insufficient documentation

## 2019-12-30 DIAGNOSIS — R209 Unspecified disturbances of skin sensation: Secondary | ICD-10-CM | POA: Insufficient documentation

## 2019-12-30 DIAGNOSIS — E785 Hyperlipidemia, unspecified: Secondary | ICD-10-CM | POA: Insufficient documentation

## 2019-12-30 DIAGNOSIS — N1831 Chronic kidney disease, stage 3a: Secondary | ICD-10-CM | POA: Insufficient documentation

## 2019-12-30 NOTE — H&P (Signed)
[facility] Appointment: 12/30/2019 9:45 AM Location: [place] Office Patient #: [id number] DOB: [birth year] Married / Language: Cleophus Molt / Race: White Male  History of Present Illness Adin Hector MD; 12/30/2019 10:15 AM) The patient is a 79 year old male who presents with an inguinal hernia. Note for "Inguinal hernia": ` ` ` Patient sent for surgical consultation at the request of Leeroy Cha, MD  Chief Complaint: right groin swelling. Probable hernia. ` ` The patient is a 53-year-old gentleman who noted right groin swelling. Inguinal hernia suspected by his primary care physician. Surgical consultation offered. 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 ended up having a laparoscopic small bowel resection I confirmed lipoma by [facility] in 2013. Patient's wife wonder if he had had a prior hernia repair but he does not recall that. No other abdominal surgeries. Patient's had some intermittent sharp pains in the right groin this year. Lump became more obvious and is gotten larger. Patient and wife concerned. Patient does have to get up to urinate once at night. Sometimes sensation urination not great but no major hesitancy or frequency. Has erectile dysfunction for which he's been on medicine for. Looks like he does take Flomax as well. He is a diabetic. Had diarrhea on metformin but is better on the new her oral hypoglycemic. Sleep apnea uses CPAP. He does not smoke. He is able to do most daily chairs and work around the house but cannot walk more than a block or 2. Sounds like he's had some issues with gait and memory recall but no major neurological dysfunction according to his neurologist. Usually moves his bowels at least once or twice a day.  (Review of systems as stated in this history (HPI) or in the review of systems. Otherwise  all other 12 point ROS are negative) ` ` ###########################################`  This patient encounter took 45 minutes today to perform the following: obtain history, perform exam, review outside records, interpret tests & imaging, counsel the patient on their diagnosis; and, document this encounter, including findings & plan in the electronic health record (EHR).   Past Surgical History Illene Regulus, CMA; 12/30/2019 9:44 AM) Colon Polyp Removal - Colonoscopy Shoulder Surgery Left. Spinal Surgery - Lower Back Tonsillectomy Vasectomy  Diagnostic Studies History Illene Regulus, CMA; 12/30/2019 9:44 AM) Colonoscopy 5-10 years ago  Allergies Lars Mage Spillers, CMA; 12/30/2019 9:45 AM) No Known Drug Allergies [12/30/2019]:  Medication History (Alisha Spillers, CMA; 12/30/2019 9:46 AM) hydroCHLOROthiazide (25MG  Tablet, Oral) Active. Jardiance (25MG  Tablet, Oral) Active. Potassium Chloride Crys ER (20MEQ Tablet ER, Oral) Active. Tamsulosin HCl (0.4MG  Capsule, Oral) Active. Pepcid AC (10MG  Tablet, Oral) Active. Ferrous Sulfate (325 (65 Fe)MG Tablet, Oral) Active. Vitamin D (Cholecalciferol) (25 MCG(1000 UT) Capsule, Oral) Active. Medications Reconciled  Social History Illene Regulus, CMA; 12/30/2019 9:44 AM) Alcohol use Occasional alcohol use. Caffeine use Coffee. No drug use Tobacco use Never smoker.  Family History Illene Regulus, CMA; 12/30/2019 9:44 AM) Colon Cancer Mother.  Other Problems Illene Regulus, CMA; 12/30/2019 9:44 AM) Depression Diabetes Mellitus Gastroesophageal Reflux Disease Sleep Apnea     Review of Systems (Alisha Spillers CMA; 12/30/2019 9:44 AM) General Present- Fatigue. Not Present- Appetite Loss, Chills, Fever, Night Sweats, Weight Gain and Weight Loss. Skin Not Present- Change in Wart/Mole, Dryness, Hives, Jaundice, New Lesions, Non-Healing Wounds, Rash and Ulcer. HEENT Present- Hearing Loss, Ringing in the Ears  and Wears glasses/contact lenses. Not Present-  Earache, Hoarseness, Nose Bleed, Oral Ulcers, Seasonal Allergies, Sinus Pain, Sore Throat, Visual Disturbances and Yellow Eyes. Respiratory Not Present- Bloody sputum, Chronic Cough, Difficulty Breathing, Snoring and Wheezing. Cardiovascular Not Present- Chest Pain, Difficulty Breathing Lying Down, Leg Cramps, Palpitations, Rapid Heart Rate, Shortness of Breath and Swelling of Extremities. Gastrointestinal Not Present- Abdominal Pain, Bloating, Bloody Stool, Change in Bowel Habits, Chronic diarrhea, Constipation, Difficulty Swallowing, Excessive gas, Gets full quickly at meals, Hemorrhoids, Indigestion, Nausea, Rectal Pain and Vomiting. Male Genitourinary Not Present- Blood in Urine, Change in Urinary Stream, Frequency, Impotence, Nocturia, Painful Urination, Urgency and Urine Leakage. Musculoskeletal Present- Muscle Weakness. Not Present- Back Pain, Joint Pain, Joint Stiffness, Muscle Pain and Swelling of Extremities. Neurological Not Present- Decreased Memory, Fainting, Headaches, Numbness, Seizures, Tingling, Tremor, Trouble walking and Weakness.  Vitals (Alisha Spillers CMA; 12/30/2019 9:45 AM) 12/30/2019 9:44 AM Weight: 144 lb Height: 64.5in Body Surface Area: 1.71 m Body Mass Index: 24.34 kg/m  Pulse: 73 (Regular)  BP: 122/72(Sitting, Left Arm, Standard)        Physical Exam Adin Hector MD; 12/30/2019 10:15 AM)  General Mental Status-Alert. General Appearance-Not in acute distress, Not Sickly. Orientation-Oriented X3. Hydration-Well hydrated. Voice-Normal.  Integumentary Global Assessment Upon inspection and palpation of skin surfaces of the - Axillae: non-tender, no inflammation or ulceration, no drainage. and Distribution of scalp and body hair is normal. General Characteristics Temperature - normal warmth is noted.  Head and Neck Head-normocephalic, atraumatic with no lesions or palpable  masses. Face Global Assessment - atraumatic, no absence of expression. Neck Global Assessment - no abnormal movements, no bruit auscultated on the right, no bruit auscultated on the left, no decreased range of motion, non-tender. Trachea-midline. Thyroid Gland Characteristics - non-tender.  Eye Eyeball - Left-Extraocular movements intact, No Nystagmus - Left. Eyeball - Right-Extraocular movements intact, No Nystagmus - Right. Cornea - Left-No Hazy - Left. Cornea - Right-No Hazy - Right. Sclera/Conjunctiva - Left-No scleral icterus, No Discharge - Left. Sclera/Conjunctiva - Right-No scleral icterus, No Discharge - Right. Pupil - Left-Direct reaction to light normal. Pupil - Right-Direct reaction to light normal. Note: Wears glasses. Vision corrected  ENMT Ears Pinna - Left - no drainage observed, no generalized tenderness observed. Pinna - Right - no drainage observed, no generalized tenderness observed. Nose and Sinuses External Inspection of the Nose - no destructive lesion observed. Inspection of the nares - Left - quiet respiration. Inspection of the nares - Right - quiet respiration. Mouth and Throat Lips - Upper Lip - no fissures observed, no pallor noted. Lower Lip - no fissures observed, no pallor noted. Nasopharynx - no discharge present. Oral Cavity/Oropharynx - Tongue - no dryness observed. Oral Mucosa - no cyanosis observed. Hypopharynx - no evidence of airway distress observed. Note: mild hearing loss.  Chest and Lung Exam Inspection Movements - Normal and Symmetrical. Accessory muscles - No use of accessory muscles in breathing. Palpation Palpation of the chest reveals - Non-tender. Auscultation Breath sounds - Normal and Clear.  Cardiovascular Auscultation Rhythm - Regular. Murmurs & Other Heart Sounds - Auscultation of the heart reveals - No Murmurs and No Systolic Clicks.  Abdomen Inspection Inspection of the abdomen reveals - No Visible  peristalsis and No Abnormal pulsations. Umbilicus - No Bleeding, No Urine drainage. Palpation/Percussion Palpation and Percussion of the abdomen reveal - Soft, Non Tender, No Rebound tenderness, No Rigidity (guarding) and No Cutaneous hyperesthesia. Note: Abdomen soft. Nontender. Not distended. moderate-sized supraumbilical diastases recti. Obese and thin-walledNo umbilical or incisional hernias. No guarding.  Male Genitourinary Sexual Maturity Tanner 5 - Adult hair pattern and Adult penile size and shape. Note: large right inguinal hernia going down to the scrotum partially. Smaller one on the left side. No testicular masses. Otherwise normal external genitalia  Peripheral Vascular Upper Extremity Inspection - Left - No Cyanotic nailbeds - Left, Not Ischemic. Inspection - Right - No Cyanotic nailbeds - Right, Not Ischemic.  Neurologic Neurologic evaluation reveals -normal attention span and ability to concentrate, able to name objects and repeat phrases. Appropriate fund of knowledge , normal sensation and normal coordination. Mental Status Affect - not angry, not paranoid. Cranial Nerves-Normal Bilaterally. Gait-Normal.  Neuropsychiatric Mental status exam performed with findings of-able to articulate well with normal speech/language, rate, volume and coherence, thought content normal with ability to perform basic computations and apply abstract reasoning and no evidence of hallucinations, delusions, obsessions or homicidal/suicidal ideation.  Musculoskeletal Global Assessment Spine, Ribs and Pelvis - no instability, subluxation or laxity. Right Upper Extremity - no instability, subluxation or laxity.  Lymphatic Head & Neck  General Head & Neck Lymphatics: Bilateral - Description - No Localized lymphadenopathy. Axillary  General Axillary Region: Bilateral - Description - No Localized lymphadenopathy. Femoral & Inguinal  Generalized Femoral & Inguinal Lymphatics:  Left - Description - No Localized lymphadenopathy. Right - Description - No Localized lymphadenopathy.    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 Sensitive but ultimately reducible. Smaller but definite left inguinal hernia as well.  he has somewhat decreased performance status but I think can tolerate hernia repair.  Would like get cardiac clearance. patient recall seeing Dr. Alvester Chou many years ago. Hemoglobin had a heart block issue around the time of his small bowel surgery 2015. Patient's wife sees Dr. Alvester Chou red regularly as well. We will ask for input.   DOE (DYSPNEA ON EXERTION) (R06.00) Impression: somewhat mildly decreased performance status picking tired postdated activities in this elderly gentleman. Has seen Dr. Alvester Chou with cardiology many years ago. Would like to double check there is no major cardiac concerns for surgery.  Current Plans I recommended obtaining preoperative cardiac clearance. I am concerned about the health of the patient and the ability to tolerate the operation. Therefore, we will request clearance by cardiology to better assess operative risk & see if a reevaluation, further workup, etc is needed. Also recommendations on how medications such as for anticoagulation and blood pressure should be managed/held/restarted after surgery.  OSA ON CPAP (G47.33)   DM TYPE 2 (DIABETES MELLITUS, TYPE 2) (E11.9)   PREOP - ING HERNIA - ENCOUNTER FOR PREOPERATIVE EXAMINATION FOR GENERAL SURGICAL PROCEDURE (Z01.818)  Current Plans You are being scheduled for surgery- Our schedulers will call you.  You should hear from our office's scheduling department within 5 working days about the location, date, and time of surgery. We try to make accommodations for patient's preferences in scheduling  surgery, but sometimes the OR schedule or the surgeon's schedule prevents [place] from making those accommodations.  If you have not heard from our office [phone]) in 5 working days, call the office and ask for your surgeon's nurse.  If you have other questions about your diagnosis, plan, or surgery, call the office and ask for your surgeon's nurse.  Written instructions provided The anatomy & physiology of the abdominal wall and pelvic floor was discussed. The pathophysiology of hernias in the inguinal and pelvic  region was discussed. Natural history risks such as progressive enlargement, pain, incarceration, and strangulation was discussed. Contributors to complications such as smoking, obesity, diabetes, prior surgery, etc were discussed.  I feel the risks of no intervention will lead to serious problems that outweigh the operative risks; therefore, I recommended surgery to reduce and repair the hernia. I explained laparoscopic techniques with possible need for an open approach. I noted usual use of mesh to patch and/or buttress hernia repair  Risks such as bleeding, infection, abscess, need for further treatment, heart attack, death, and other risks were discussed. I noted a good likelihood this will help address the problem. Goals of post-operative recovery were discussed as well. Possibility that this will not correct all symptoms was explained. I stressed the importance of low-impact activity, aggressive pain control, avoiding constipation, & not pushing through pain to minimize risk of post-operative chronic pain or injury. Possibility of reherniation was discussed. We will work to minimize complications.  An educational handout further explaining the pathology & treatment options was given as well. Questions were answered. The patient expresses understanding & wishes to proceed with surgery.  Pt Education - Pamphlet Given - Laparoscopic Hernia Repair: discussed with patient  and provided information. Pt Education - [facility] Pain Control (Shanna Un) Pt Education - [facility] Hernia Post-Op HCI (Calli Bashor): discussed with patient and provided information. Pt Education - [facility] Mesh education: discussed with patient and provided information.  DIASTASIS RECTI (M62.08)  Current Plans Pt Education - [facility] Diastasis Recti: discussed with patient and provided information.  Adin Hector, MD, FACS, MASCRS Gastrointestinal and Minimally Invasive Surgery  [facility] [address], [place], [place] [address] [phone] Fax [phone] Main/Paging  CONTACT INFORMATION: Weekday (9AM-5PM) concerns: Call [facility] main office at [phone] Weeknight (5PM-9AM) or Weekend/Holiday concerns: Check [email or web address] for General Surgery [facility] coverage (Please, do not use SecureChat as it is not reliable communication to operating surgeons for immediate patient care)

## 2020-01-15 ENCOUNTER — Telehealth: Payer: Self-pay

## 2020-01-15 NOTE — Telephone Encounter (Signed)
Forwarded to requesting party via Epic fax function 

## 2020-01-15 NOTE — Telephone Encounter (Signed)
Mr.Hulsebus is requesting cardiac clearance for laparoscopic bilateral inguinal hernia repairs.  He will be seeing you on 01/26/2019 at 1340 p.m. I will defer his cardiac evaluation/clearance to you at that time.     Thank you for your help.  Jossie Ng. Nirvan Laban NP-C    01/15/2020, 8:36 AM [place] [address] Office [phone] Fax [phone]

## 2020-01-15 NOTE — Telephone Encounter (Signed)
   [facility] Pre-operative Risk Assessment    Request for surgical clearance:  1. What type of surgery is being performed? LAP BILATERAL INGUINAL HERNIA REPAIR   2. When is this surgery scheduled? TBD   3. What type of clearance is required (medical clearance vs. Pharmacy clearance to hold med vs. Both)? MEDICAL  4. Are there any medications that need to be held prior to surgery and how long? NONE   5. Practice name and name of physician performing surgery? [facility]  ATTN: [place], [place]  6. What is the office phone number? [phone]   7.   What is the office fax number? [phone]  8.   Anesthesia type (None, local, MAC, general) ? GENERAL  DR O'NEAL APPT 01-26-2019 _0

## 2020-01-20 ENCOUNTER — Ambulatory Visit: Payer: Medicare Other | Admitting: Physician Assistant

## 2020-01-25 NOTE — Progress Notes (Signed)
Cardiology Office Note:   Date:  01/26/2020  NAME:  Antonio [name]    MRN: [id number] DOB:  09/23/1940   PCP:  Lorenda Ishihara, MD  Cardiologist:  No primary care provider on file.   Referring MD: Karie Soda, MD   Chief Complaint  Patient presents with  . Pre-op Exam   History of Present Illness:   Antonio [name] is a 80 y.o. male with a hx of DM, HTN, HLD who is being seen today for the evaluation of preoperative assessment at the request of Gross, Viviann Spare, MD. He reports he will have bilateral inguinal hernia repairs with Dr. Star Age. Medical history is significant for diabetes. He is not on insulin. Most recent A1c 7.4. He only takes oral hypoglycemic agents. He also has high blood pressure. Blood pressure 130/72. He is never had a heart attack or stroke. No history of congestive heart failure. No significant history of kidney disease. Most recent serum creatinine 1.26. Reports he has no chest pain or shortness of breath. He can climb a flight of stairs without any chest pain or shortness of breath. He does walking around the house but no structured exercise. He is a never smoker. He does not drink alcohol or use drugs. He is asymptomatic today in the office. His EKG does demonstrate a right bundle branch block. No echocardiogram in the system. He has no symptoms from this. There is no evidence of heart failure lower extremity edema on today's examination. He is without symptoms. He is diabetic. He is not on a statin. Most recent LDL 119. Given his diabetes he should be on a statin. He reports he has reservations to do this. He will like to discuss it with his primary care physician.  Problem List 1. DM -A1c 7.4   2. HTN 3. HLD -Total cholesterol 200, HDL 58, LDL 119, triglycerides 134  Past Medical History: Past Medical History:  Diagnosis Date  . Anemia    hx  . BPH (benign prostatic hyperplasia)   . Cancer (HCC)    skin cancer to arm excised, melanoma on head  occassional  . Chronic back pain   . Depression   . Diabetes mellitus without complication (HCC)   . GERD (gastroesophageal reflux disease)   . History of carpal tunnel syndrome    Bilateral  . Hyperlipidemia    "borderline"  . Leg edema, right   . OSA on CPAP   . RBBB 01/11/2016   Noted on EKG    Past Surgical History: Past Surgical History:  Procedure Laterality Date  . CARPAL TUNNEL RELEASE Bilateral   . LUMBAR LAMINECTOMY/DECOMPRESSION MICRODISCECTOMY  09/06/2011   Procedure: LUMBAR LAMINECTOMY/DECOMPRESSION MICRODISCECTOMY 1 LEVEL;  Surgeon: Hewitt Shorts, MD;  Location: [facility];  Service: Neurosurgery;  Laterality: Left;  Left lumbar three-four Lumbar Laminotomy/microdiskectomy  . LUMBAR LAMINECTOMY/DECOMPRESSION MICRODISCECTOMY Left 12/26/2012   Procedure: LUMBAR LAMINECTOMY/DECOMPRESSION MICRODISCECTOMY LEFT LUMBAR THREE-FOUR ;  Surgeon: Hewitt Shorts, MD;  Location: [facility];  Service: Neurosurgery;  Laterality: Left;  . POLYPECTOMY     removed small intestine  . REVERSE SHOULDER ARTHROPLASTY Left 07/11/2019   Procedure: REVERSE SHOULDER ARTHROPLASTY;  Surgeon: Beverely Low, MD;  Location: [facility];  Service: Orthopedics;  Laterality: Left;  interscalene block  . ROTATOR CUFF REPAIR Left   . TONSILLECTOMY      Current Medications: Current Meds  Medication Sig  . cholecalciferol (VITAMIN D) 1000 units tablet Take 1,000 Units by mouth in the morning and at  bedtime.   . famotidine (PEPCID) 10 MG tablet Take 10 mg by mouth daily.   . ferrous sulfate 325 (65 FE) MG tablet Take 325 mg by mouth daily with breakfast.  . hydrochlorothiazide (HYDRODIURIL) 25 MG tablet Take 25 mg by mouth daily.  Marland Kitchen lamoTRIgine (LAMICTAL) 25 MG tablet TAKE 1 TABLET BY MOUTH  DAILY  . potassium chloride SA (K-DUR,KLOR-CON) 20 MEQ tablet Take 20 mEq by mouth daily.   . Tamsulosin HCl (FLOMAX) 0.4 MG CAPS Take 1 capsule (0.4 mg total) by mouth daily after breakfast.  . [DISCONTINUED]  divalproex (DEPAKOTE ER) 500 MG 24 hr tablet Take 3 tablets (1,500 mg total) by mouth daily.     Allergies:    Metformin and related, Wellbutrin [bupropion], and Zoloft [sertraline]   Social History: Social History   Socioeconomic History  . Marital status: Married    Spouse name: Not on file  . Number of children: 3  . Years of education: Not on file  . Highest education level: Not on file  Occupational History  . Occupation: retired    Comment: Animator  Tobacco Use  . Smoking status: Never Smoker  . Smokeless tobacco: Never Used  Vaping Use  . Vaping Use: Never used  Substance and Sexual Activity  . Alcohol use: No  . Drug use: No  . Sexual activity: Not on file  Other Topics Concern  . Not on file  Social History Narrative   Lives with wife in a tri-level home.  Has 3 children.  Retired from [employer].  Education: high school.   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  ROS:   All other ROS reviewed and negative. Pertinent positives noted in the HPI.     EKGs/Labs/Other Studies Reviewed:   The following studies were personally reviewed by me today:  EKG:  EKG is ordered today.  The ekg ordered today demonstrates normal sinus rhythm, heart rate 92, right bundle branch block noted, and was personally reviewed by me.   Recent Labs: 08/15/2019: ALT 9; BUN 20; Creatinine, Ser 1.20; Hemoglobin 12.4; Platelets 221; Potassium 4.5; Sodium 141   Recent Lipid Panel No results found for: CHOL, TRIG, HDL, CHOLHDL, VLDL, LDLCALC, LDLDIRECT  Physical Exam:   VS:   BP 130/72   Pulse 91   Ht 5\' 5"  (1.651 m)   Wt 143 lb (64.9 kg)   SpO2 99%   BMI 23.80 kg/m    Wt Readings from Last 3 Encounters:  01/26/20 143 lb (64.9 kg)  11/10/19 146 lb (66.2 kg)  09/09/19 146 lb 3.2 oz (66.3 kg)    General: Well nourished, well developed, in no acute distress Head: Atraumatic, normal size  Eyes: PEERLA, EOMI  Neck: Supple, no JVD Endocrine: No thryomegaly Cardiac: Normal S1, S2; RRR; no murmurs, rubs, or gallops Lungs: Clear to auscultation bilaterally, no wheezing, rhonchi or rales  Abd: Soft, nontender, no hepatomegaly  Ext: No edema, pulses 2+ Musculoskeletal: No deformities, BUE and BLE strength normal and equal Skin: Warm and dry, no rashes   Neuro: Alert and  oriented to person, place, time, and situation, CNII-XII grossly intact, no focal deficits  Psych: Normal mood and affect   ASSESSMENT:   JULIA DEC is a 80 y.o. male who presents for the following: 1. Preoperative cardiovascular examination   2. Mixed hyperlipidemia   3. RBBB     PLAN:   1. Preoperative cardiovascular examination -The Revised Cardiac Risk Index = 0, which equates to 0.4% (very low risk) estimated risk of perioperative myocardial infarction, pulmonary edema, ventricular fibrillation, cardiac arrest, or complete heart block.  -He can climb a flight of stairs without any chest pain or shortness of breath. This is greater than 4 METS. There is no need for further cardiac testing. His EKG demonstrates right bundle branch block. He has no evidence of heart failure. He has no evidence of any cardiovascular disease. Given his lack of symptoms I would recommend to proceed with surgery. -Our service is available as needed in the peri-operative period.    2. Mixed hyperlipidemia -LDL 119. He should consider a statin. He reports he would like to discuss this with his primary care physician.  3. RBBB -No symptoms. No evidence of heart failure. No need for  echocardiogram.  Disposition: Return if symptoms worsen or fail to improve.  Medication Adjustments/Labs and Tests Ordered: Current medicines are reviewed at length with the patient today.  Concerns regarding medicines are outlined above.  Orders Placed This Encounter  Procedures  . EKG 12-Lead   No orders of the defined types were placed in this encounter.   Patient Instructions  Medication Instructions:  The current medical regimen is effective;  continue present plan and medications.  *If you need a refill on your cardiac medications before your next appointment, please call your pharmacy*  Follow-Up: At [facility], you and your health needs are our priority.  As part of our continuing mission to provide you with exceptional heart care, we have created designated Provider Care Teams.  These Care Teams include your primary Cardiologist (physician) and Advanced Practice Providers (APPs -  Physician Assistants and Nurse Practitioners) who all work together to provide you with the care you need, when you need it.  We recommend signing up for the patient portal called "MyChart".  Sign up information is provided on this After Visit Summary.  MyChart is used to connect with patients for Virtual Visits (Telemedicine).  Patients are able to view lab/test results, encounter notes, upcoming appointments, etc.  Non-urgent messages can be sent to your provider as well.   To learn more about what you can do with MyChart, go to [email or web address].    Your next appointment:   As needed  The format for your next appointment:   In Person  Provider:   Eleonore Chiquito, MD        Signed, Addison Naegeli. Audie Box, [place]  [address], [place], [place] [address] [phone]  01/26/2020 2:03 PM

## 2020-01-26 ENCOUNTER — Ambulatory Visit (INDEPENDENT_AMBULATORY_CARE_PROVIDER_SITE_OTHER): Payer: Medicare Other | Admitting: Cardiovascular Disease

## 2020-01-26 ENCOUNTER — Encounter: Payer: Self-pay | Admitting: Cardiovascular Disease

## 2020-01-26 ENCOUNTER — Other Ambulatory Visit: Payer: Self-pay

## 2020-01-26 VITALS — BP 130/72 | HR 91 | Ht 65.0 in | Wt 143.0 lb

## 2020-01-26 DIAGNOSIS — I4510 Unspecified right bundle-branch block: Secondary | ICD-10-CM | POA: Diagnosis not present

## 2020-01-26 DIAGNOSIS — E782 Mixed hyperlipidemia: Secondary | ICD-10-CM

## 2020-01-26 DIAGNOSIS — Z01810 Encounter for preprocedural cardiovascular examination: Secondary | ICD-10-CM

## 2020-01-26 NOTE — Patient Instructions (Signed)
Medication Instructions:  The current medical regimen is effective;  continue present plan and medications.  *If you need a refill on your cardiac medications before your next appointment, please call your pharmacy*    Follow-Up: At [facility], you and your health needs are our priority.  As part of our continuing mission to provide you with exceptional heart care, we have created designated Provider Care Teams.  These Care Teams include your primary Cardiologist (physician) and Advanced Practice Providers (APPs -  Physician Assistants and Nurse Practitioners) who all work together to provide you with the care you need, when you need it.  We recommend signing up for the patient portal called "MyChart".  Sign up information is provided on this After Visit Summary.  MyChart is used to connect with patients for Virtual Visits (Telemedicine).  Patients are able to view lab/test results, encounter notes, upcoming appointments, etc.  Non-urgent messages can be sent to your provider as well.   To learn more about what you can do with MyChart, go to [email or web address].    Your next appointment:   As needed  The format for your next appointment:   In Person  Provider:   [place] O'Neal, MD      

## 2020-01-27 ENCOUNTER — Ambulatory Visit: Payer: Medicare Other | Admitting: Psychiatry

## 2020-02-06 ENCOUNTER — Other Ambulatory Visit: Payer: Self-pay

## 2020-02-10 ENCOUNTER — Encounter: Payer: Self-pay | Admitting: Endocrinology

## 2020-02-10 ENCOUNTER — Telehealth (INDEPENDENT_AMBULATORY_CARE_PROVIDER_SITE_OTHER): Payer: Medicare Other | Admitting: Endocrinology

## 2020-02-10 VITALS — Ht 65.0 in | Wt 143.0 lb

## 2020-02-10 DIAGNOSIS — E1122 Type 2 diabetes mellitus with diabetic chronic kidney disease: Secondary | ICD-10-CM | POA: Diagnosis not present

## 2020-02-10 DIAGNOSIS — N1831 Chronic kidney disease, stage 3a: Secondary | ICD-10-CM | POA: Diagnosis not present

## 2020-02-10 MED ORDER — EMPAGLIFLOZIN 25 MG PO TABS: 25.0000 mg | ORAL_TABLET | Freq: Every day | ORAL | 11 refills | Status: DC

## 2020-02-10 NOTE — Patient Instructions (Addendum)
check your blood sugar once a day.  vary the time of day when you check, between before the 3 meals, and at bedtime.  also check if you have symptoms of your blood sugar being too high or too low.  please keep a record of the readings and bring it to your next appointment here (or you can bring the meter itself).  You can write it on any piece of paper.  please call us sooner if your blood sugar goes below 70, or if you have a lot of readings over 200.   Please continue the same Jardiance.   Please come back for a follow-up appointment in 6 weeks.

## 2020-02-10 NOTE — Progress Notes (Signed)
Subjective:    Patient ID: Antonio [name], male    DOB: 27-Feb-1940, 80 y.o.   MRN: [id number]  HPI telehealth visit today via video visit.  Alternatives to telehealth are presented to this patient, and the patient agrees to the telehealth visit. Pt is advised of the cost of the visit, and agrees to this, also.   Patient is at home, and I am at the office.   Persons attending the telehealth visit: the patient and I Pt returns for f/u of diabetes mellitus: DM type: 2 Dx'ed: 0102 Complications: stage 3 CRI Therapy: Jardiance DKA: never Severe hypoglycemia: never Pancreatitis: never Pancreatic imaging: CT (2013): no mention is made of the pancreas SDOH: none Other: he has never been on insulin; he did not tolerate Rybelsus (nausea), or metformin-XR (diarrhea).   Interval history: pt says cbg's are in the mid-100's.  pt states he feels well in general.  He received Jardiance samples from primary care provider.   Past Medical History:  Diagnosis Date  . Anemia    hx  . BPH (benign prostatic hyperplasia)   . 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 Chronic back pain   . Depression   . Diabetes mellitus without complication ([place])   . GERD (gastroesophageal reflux disease)   . History of carpal tunnel syndrome    Bilateral  . Hyperlipidemia    "borderline"  . Leg edema, right   . OSA on CPAP   . 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 CARPAL TUNNEL RELEASE Bilateral   . LUMBAR LAMINECTOMY/DECOMPRESSION MICRODISCECTOMY  09/06/2011   Procedure: LUMBAR LAMINECTOMY/DECOMPRESSION MICRODISCECTOMY 1 LEVEL;  Surgeon: Hosie Spangle, MD;  Location: [facility];  Service: Neurosurgery;  Laterality: Left;  Left lumbar three-four Lumbar Laminotomy/microdiskectomy  . LUMBAR LAMINECTOMY/DECOMPRESSION MICRODISCECTOMY Left 12/26/2012   Procedure: LUMBAR LAMINECTOMY/DECOMPRESSION MICRODISCECTOMY LEFT  LUMBAR THREE-FOUR ;  Surgeon: Hosie Spangle, MD;  Location: [facility];  Service: Neurosurgery;  Laterality: Left;  . POLYPECTOMY     removed small intestine  . REVERSE SHOULDER ARTHROPLASTY Left 07/11/2019   Procedure: REVERSE SHOULDER ARTHROPLASTY;  Surgeon: Netta Cedars, MD;  Location: [facility];  Service: Orthopedics;  Laterality: Left;  interscalene block  . ROTATOR CUFF REPAIR Left   . TONSILLECTOMY      Social History   Socioeconomic History  . Marital status: Married    Spouse name: Not on file  . Number of children: 3  . Years of education: Not on file  . Highest education level: Not on file  Occupational History  . Occupation: retired    Comment: Paediatric nurse  Tobacco Use  . Smoking status: Never Smoker  . Smokeless tobacco: Never Used  Vaping Use  . Vaping Use: Never used  Substance and Sexual Activity  . Alcohol use: No  . Drug use: No  . Sexual activity: Not on file  Other Topics Concern  . Not on file  Social History Narrative   Lives with wife in a tri-level home.  Has 3 children.  Retired from [employer].  Education: high school.   They help raise Yorkie Warden/ranger   Social Determinants of Health   Financial Resource Strain: Not on file  Food Insecurity: Not on file  Transportation Needs: Not on file  Physical Activity: Not on file  Stress: Not on file  Social Connections: Not on  file  Intimate Partner Violence: Not on file    Current Outpatient Medications on File Prior to Visit  Medication Sig Dispense Refill  . cholecalciferol (VITAMIN D) 1000 units tablet Take 1,000 Units by mouth in the morning and at bedtime.     . famotidine (PEPCID) 10 MG tablet Take 10 mg by mouth daily.     . ferrous sulfate 325 (65 FE) MG tablet Take 325 mg by mouth daily with breakfast.    . hydrochlorothiazide (HYDRODIURIL) 25 MG tablet Take 25 mg by mouth daily.    Marland Kitchen lamoTRIgine (LAMICTAL) 25 MG tablet TAKE 1 TABLET BY MOUTH  DAILY 90 tablet 0  .  potassium chloride SA (K-DUR,KLOR-CON) 20 MEQ tablet Take 20 mEq by mouth daily.   3  . Tamsulosin HCl (FLOMAX) 0.4 MG CAPS Take 1 capsule (0.4 mg total) by mouth daily after breakfast. 30 capsule 0   No current facility-administered medications on file prior to visit.    Allergies  Allergen Reactions  . Metformin And Related Diarrhea  . Wellbutrin [Bupropion] Other (See Comments)    Mental fogginess per patient  . Zoloft [Sertraline] Other (See Comments)    Mental fogginess per patient    Family History  Problem Relation Age of Onset  . Colon cancer Mother   . COPD Father   . Diabetes Mellitus I Brother   . Hypertension Daughter     Ht 5\' 5"  (1.651 m)   Wt 143 lb (64.9 kg)   BMI 23.80 kg/m    Review of Systems     Objective:   Physical Exam      Assessment & Plan:  Type 2 DM, with CRI: apparently well-controlled.  We discussed.  He wants to continue Jardiance for now.    Patient Instructions  check your blood sugar once a day.  vary the time of day when you check, between before the 3 meals, and at bedtime.  also check if you have symptoms of your blood sugar being too high or too low.  please keep a record of the readings and bring it to your next appointment here (or you can bring the meter itself).  You can write it on any piece of paper.  please call us sooner if your blood sugar goes below 70, or if you have a lot of readings over 200.   Please continue the same Jardiance.   Please come back for a follow-up appointment in 6 weeks.

## 2020-02-10 NOTE — Progress Notes (Signed)
CBG today--fasting 158

## 2020-03-02 NOTE — Progress Notes (Signed)
DUE TO COVID-19 ONLY ONE VISITOR IS ALLOWED TO COME WITH YOU AND STAY IN THE WAITING ROOM ONLY DURING PRE OP AND PROCEDURE DAY OF SURGERY. THE 1 VISITOR  MAY VISIT WITH YOU AFTER SURGERY IN YOUR PRIVATE ROOM DURING VISITING HOURS ONLY!  YOU NEED TO HAVE A COVID 19 TEST ON___2/14/2022 ____ @_______ , THIS TEST MUST BE DONE BEFORE SURGERY,  COVID TESTING SITE [address] [name] [address], IT IS ON THE RIGHT GOING OUT [address] APPROXIMATELY  2 MINUTES PAST [employer] ON THE RIGHT. ONCE YOUR COVID TEST IS COMPLETED,  PLEASE BEGIN THE QUARANTINE INSTRUCTIONS AS OUTLINED IN YOUR HANDOUT.                Antonio [name]  03/02/2020   Your procedure is scheduled on: 03/11/2020    Report to [facility] Main  Entrance   Report to admitting at    0530 AM     Call this number if you have problems the morning of surgery [phone]    REMEMBER: NO  SOLID FOOD CANDY OR GUM AFTER MIDNIGHT. CLEAR LIQUIDS UNTIL   0430   Am NOTHING BY MOUTH EXCEPT CLEAR LIQUIDS UNTIL    . PLEASE FINISH ENSURE DRINK PER SURGEON ORDER  WHICH NEEDS TO BE COMPLETED AT  0430am    .      CLEAR LIQUID DIET   Foods Allowed                                                                    Coffee and tea, regular and decaf                            Fruit ices (not with fruit pulp)                                      Iced Popsicles                                    Carbonated beverages, regular and diet                                    Cranberry, grape and apple juices Sports drinks like Gatorade Lightly seasoned clear broth or consume(fat free) Sugar, honey syrup ___________________________________________________________________      BRUSH YOUR TEETH MORNING OF SURGERY AND RINSE YOUR MOUTH OUT, NO CHEWING GUM CANDY OR MINTS.     Take these medicines the morning of surgery with A SIP OF WATER: flomax, lamictal, pepcid   DO NOT TAKE ANY DIABETIC MEDICATIONS DAY OF YOUR  SURGERY                               You may not have any metal on your body including hair pins and              piercings  Do not wear jewelry, make-up,  lotions, powders or perfumes, deodorant             Do not wear nail polish on your fingernails.  Do not shave  48 hours prior to surgery.              Men may shave face and neck.   Do not bring valuables to the hospital. [place].  Contacts, dentures or bridgework may not be worn into surgery.  Leave suitcase in the car. After surgery it may be brought to your room.     Patients discharged the day of surgery will not be allowed to drive home. IF YOU ARE HAVING SURGERY AND GOING HOME THE SAME DAY, YOU MUST HAVE AN ADULT TO DRIVE YOU HOME AND BE WITH YOU FOR 24 HOURS. YOU MAY GO HOME BY TAXI OR UBER OR ORTHERWISE, BUT AN ADULT MUST ACCOMPANY YOU HOME AND STAY WITH YOU FOR 24 HOURS.  Name and phone number of your driver:  Special Instructions: N/A              Please read over the following fact sheets you were given: _____________________________________________________________________  [facility] - Preparing for Surgery Before surgery, you can play an important role.  Because skin is not sterile, your skin needs to be as free of germs as possible.  You can reduce the number of germs on your skin by washing with CHG (chlorahexidine gluconate) soap before surgery.  CHG is an antiseptic cleaner which kills germs and bonds with the skin to continue killing germs even after washing. Please DO NOT use if you have an allergy to CHG or antibacterial soaps.  If your skin becomes reddened/irritated stop using the CHG and inform your nurse when you arrive at Short Stay. Do not shave (including legs and underarms) for at least 48 hours prior to the first CHG shower.  You may shave your face/neck. Please follow these instructions carefully:  1.  Shower with CHG Soap the night before surgery and the   morning of Surgery.  2.  If you choose to wash your hair, wash your hair first as usual with your  normal  shampoo.  3.  After you shampoo, rinse your hair and body thoroughly to remove the  shampoo.                           4.  Use CHG as you would any other liquid soap.  You can apply chg directly  to the skin and wash                       Gently with a scrungie or clean washcloth.  5.  Apply the CHG Soap to your body ONLY FROM THE NECK DOWN.   Do not use on face/ open                           Wound or open sores. Avoid contact with eyes, ears mouth and genitals (private parts).                       Wash face,  Genitals (private parts) with your normal soap.             6.  Wash thoroughly, paying special attention to  the area where your surgery  will be performed.  7.  Thoroughly rinse your body with warm water from the neck down.  8.  DO NOT shower/wash with your normal soap after using and rinsing off  the CHG Soap.                9.  Pat yourself dry with a clean towel.            10.  Wear clean pajamas.            11.  Place clean sheets on your bed the night of your first shower and do not  sleep with pets. Day of Surgery : Do not apply any lotions/deodorants the morning of surgery.  Please wear clean clothes to the hospital/surgery center.  FAILURE TO FOLLOW THESE INSTRUCTIONS MAY RESULT IN THE CANCELLATION OF YOUR SURGERY PATIENT SIGNATURE_________________________________  NURSE SIGNATURE__________________________________  ________________________________________________________________________

## 2020-03-03 ENCOUNTER — Encounter (HOSPITAL_COMMUNITY): Payer: Self-pay

## 2020-03-03 ENCOUNTER — Encounter (HOSPITAL_COMMUNITY)
Admission: RE | Admit: 2020-03-03 | Discharge: 2020-03-03 | Disposition: A | Discharge status: Home / Self Care | Payer: Medicare Other | Source: Ambulatory Visit | Attending: Surgery | Admitting: Surgery

## 2020-03-03 ENCOUNTER — Other Ambulatory Visit: Payer: Self-pay

## 2020-03-03 DIAGNOSIS — Z01812 Encounter for preprocedural laboratory examination: Secondary | ICD-10-CM | POA: Diagnosis present

## 2020-03-03 HISTORY — DX: Essential (primary) hypertension: I10

## 2020-03-03 HISTORY — DX: Cardiac murmur, unspecified: R01.1

## 2020-03-03 LAB — BASIC METABOLIC PANEL
Anion gap: 10 (ref 5–15)
BUN: 36 mg/dL — ABNORMAL HIGH (ref 8–23)
CO2: 27 mmol/L (ref 22–32)
Calcium: 9 mg/dL (ref 8.9–10.3)
Chloride: 103 mmol/L (ref 98–111)
Creatinine, Ser: 1.22 mg/dL (ref 0.61–1.24)
GFR, Estimated: 60 mL/min — ABNORMAL LOW (ref >60)
Glucose, Bld: 144 mg/dL — ABNORMAL HIGH (ref 70–99)
Potassium: 3.6 mmol/L (ref 3.5–5.1)
Sodium: 140 mmol/L (ref 135–145)

## 2020-03-03 LAB — CBC
HCT: 44.2 % (ref 39.0–52.0)
Hemoglobin: 14.7 g/dL (ref 13.0–17.0)
MCH: 30.9 pg (ref 26.0–34.0)
MCHC: 33.3 g/dL (ref 30.0–36.0)
MCV: 92.9 fL (ref 80.0–100.0)
Platelets: 248 10*3/uL (ref 150–400)
RBC: 4.76 MIL/uL (ref 4.22–5.81)
RDW: 13.1 % (ref 11.5–15.5)
WBC: 8.1 10*3/uL (ref 4.0–10.5)
nRBC: 0 % (ref 0.0–0.2)

## 2020-03-03 LAB — GLUCOSE, CAPILLARY: Glucose-Capillary: 151 mg/dL — ABNORMAL HIGH (ref 70–99)

## 2020-03-03 LAB — HEMOGLOBIN A1C
Hgb A1c MFr Bld: 7.5 % — ABNORMAL HIGH (ref 4.8–5.6)
Mean Plasma Glucose: 168.55 mg/dL

## 2020-03-03 NOTE — Progress Notes (Addendum)
Anesthesia Review:  PCP: DR Renato Shin Last visit - video visit- 02/10/2020 Cardiologist : Eleonore Chiquito - 01/26/20- cardiac clearance - LOV  Chest x-ray : EKG :07/04/19  Echo : Stress test: Cardiac Cath :  Activity level: can climb a flight of stairs without difficulty  Sleep Study/ CPAP : Fasting Blood Sugar :      / Checks Blood Sugar -- times a day:   Blood Thinner/ Instructions /Last Dose: ASA / Instructions/ Last Dose :  DM- type 2  Checks glucose once daily  A1c-03/03/20 - 7.5  Routed to DR Gross.

## 2020-03-04 NOTE — Anesthesia Preprocedure Evaluation (Addendum)
Anesthesia Evaluation  Patient identified by MRN, date of birth, ID band Patient awake    Reviewed: Allergy & Precautions, NPO status , Patient's Chart, lab work & pertinent test results  Airway Mallampati: II  TM Distance: >3 FB Neck ROM: Full    Dental  (+) Teeth Intact   Pulmonary sleep apnea and Continuous Positive Airway Pressure Ventilation ,    Pulmonary exam normal        Cardiovascular hypertension, Pt. on medications  Rhythm:Regular Rate:Normal     Neuro/Psych Depression negative neurological ROS     GI/Hepatic Neg liver ROS, GERD  Medicated and Controlled,bilateal inguinal hernias   Endo/Other  diabetes, Well Controlled  Renal/GU negative Renal ROS   BPH    Musculoskeletal  (+) Arthritis , Osteoarthritis,    Abdominal (+)  Abdomen: soft. Bowel sounds: normal.  Peds  Hematology  (+) anemia ,   Anesthesia Other Findings   Reproductive/Obstetrics                           Anesthesia Physical Anesthesia Plan  ASA: II  Anesthesia Plan: General   Post-op Pain Management:    Induction: Intravenous  PONV Risk Score and Plan: 2 and Ondansetron, Dexamethasone and Treatment may vary due to age or medical condition  Airway Management Planned: Mask and Oral ETT  Additional Equipment: None  Intra-op Plan:   Post-operative Plan: Extubation in OR  Informed Consent:   Plan Discussed with:   Anesthesia Plan Comments: (Per cardiology note 01/26/20, "Preoperative cardiovascular examination -The Revised Cardiac Risk Index = 0, which equates to 0.4% (very low risk) estimated risk of perioperative myocardial infarction, pulmonary edema, ventricular fibrillation, cardiac arrest, or complete heart block.  -He can climb a flight of stairs without any chest pain or shortness of breath. This is greater than 4 METS. There is no need for further cardiac testing. His EKG demonstrates right  bundle branch block. He has no evidence of heart failure. He has no evidence of any cardiovascular disease. Given his lack of symptoms I would recommend to proceed with surgery. -Our service is available as needed in the peri-operative period." Lab Results      Component                Value               Date                      WBC                      8.1                 03/03/2020                HGB                      14.7                03/03/2020                HCT                      44.2                03/03/2020                MCV  92.9                03/03/2020                PLT                      248                 03/03/2020           Lab Results      Component                Value               Date                      NA                       140                 03/03/2020                K                        3.6                 03/03/2020                CO2                      27                  03/03/2020                GLUCOSE                  144 (H)             03/03/2020                BUN                      36 (H)              03/03/2020                CREATININE               1.22                03/03/2020                CALCIUM                  9.0                 03/03/2020                GFRNONAA                 60 (L)              03/03/2020                GFRAA                    66                  08/15/2019          )  Anesthesia Quick Evaluation  

## 2020-03-08 ENCOUNTER — Other Ambulatory Visit (HOSPITAL_COMMUNITY)
Admission: RE | Admit: 2020-03-08 | Discharge: 2020-03-08 | Disposition: A | Discharge status: Home / Self Care | Payer: Medicare Other | Source: Ambulatory Visit | Attending: Surgery | Admitting: Surgery

## 2020-03-08 DIAGNOSIS — Z20822 Contact with and (suspected) exposure to covid-19: Secondary | ICD-10-CM | POA: Insufficient documentation

## 2020-03-08 DIAGNOSIS — Z01812 Encounter for preprocedural laboratory examination: Secondary | ICD-10-CM | POA: Diagnosis present

## 2020-03-08 LAB — SARS CORONAVIRUS 2 (TAT 6-24 HRS): SARS Coronavirus 2: NEGATIVE

## 2020-03-09 ENCOUNTER — Ambulatory Visit (INDEPENDENT_AMBULATORY_CARE_PROVIDER_SITE_OTHER): Payer: Medicare Other | Admitting: Psychiatry

## 2020-03-09 ENCOUNTER — Other Ambulatory Visit: Payer: Self-pay

## 2020-03-09 DIAGNOSIS — Z630 Problems in relationship with spouse or partner: Secondary | ICD-10-CM

## 2020-03-09 DIAGNOSIS — F331 Major depressive disorder, recurrent, moderate: Secondary | ICD-10-CM

## 2020-03-09 DIAGNOSIS — G4721 Circadian rhythm sleep disorder, delayed sleep phase type: Secondary | ICD-10-CM | POA: Diagnosis not present
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
## 2020-03-10 MED ORDER — BUPIVACAINE LIPOSOME 1.3 % IJ SUSP
20.0000 mL | Freq: Once | INTRAMUSCULAR | Status: DC
  Filled 2020-03-10: qty 20

## 2020-03-11 ENCOUNTER — Ambulatory Visit (HOSPITAL_COMMUNITY)
Admission: RE | Admit: 2020-03-11 | Discharge: 2020-03-11 | Disposition: A | Discharge status: Home / Self Care | Payer: Medicare Other | Attending: Surgery | Admitting: Surgery

## 2020-03-11 ENCOUNTER — Ambulatory Visit (HOSPITAL_COMMUNITY): Payer: Medicare Other | Admitting: Physician Assistant

## 2020-03-11 ENCOUNTER — Encounter (HOSPITAL_COMMUNITY)
Admission: RE | Disposition: A | Discharge status: Home / Self Care | Payer: Self-pay | Source: Home / Self Care | Attending: Surgery

## 2020-03-11 ENCOUNTER — Ambulatory Visit (HOSPITAL_COMMUNITY): Payer: Medicare Other | Admitting: Anesthesiology

## 2020-03-11 ENCOUNTER — Encounter (HOSPITAL_COMMUNITY): Payer: Self-pay | Admitting: Surgery

## 2020-03-11 DIAGNOSIS — K4020 Bilateral inguinal hernia, without obstruction or gangrene, not specified as recurrent: Secondary | ICD-10-CM | POA: Diagnosis not present

## 2020-03-11 DIAGNOSIS — Z86010 Personal history of colonic polyps: Secondary | ICD-10-CM | POA: Insufficient documentation

## 2020-03-11 DIAGNOSIS — N1831 Chronic kidney disease, stage 3a: Secondary | ICD-10-CM | POA: Diagnosis not present

## 2020-03-11 DIAGNOSIS — Z800 Family history of malignant neoplasm of digestive organs: Secondary | ICD-10-CM | POA: Diagnosis not present

## 2020-03-11 DIAGNOSIS — K219 Gastro-esophageal reflux disease without esophagitis: Secondary | ICD-10-CM | POA: Insufficient documentation

## 2020-03-11 DIAGNOSIS — K432 Incisional hernia without obstruction or gangrene: Secondary | ICD-10-CM | POA: Diagnosis present

## 2020-03-11 DIAGNOSIS — G4733 Obstructive sleep apnea (adult) (pediatric): Secondary | ICD-10-CM | POA: Insufficient documentation

## 2020-03-11 DIAGNOSIS — E1122 Type 2 diabetes mellitus with diabetic chronic kidney disease: Secondary | ICD-10-CM | POA: Diagnosis not present

## 2020-03-11 DIAGNOSIS — Z9852 Vasectomy status: Secondary | ICD-10-CM | POA: Diagnosis not present

## 2020-03-11 HISTORY — PX: INGUINAL HERNIA REPAIR: SHX194

## 2020-03-11 LAB — GLUCOSE, CAPILLARY
Glucose-Capillary: 135 mg/dL — ABNORMAL HIGH (ref 70–99)
Glucose-Capillary: 221 mg/dL — ABNORMAL HIGH (ref 70–99)
Glucose-Capillary: 222 mg/dL — ABNORMAL HIGH (ref 70–99)
Glucose-Capillary: 227 mg/dL — ABNORMAL HIGH (ref 70–99)

## 2020-03-11 SURGERY — REPAIR, HERNIA, INGUINAL, BILATERAL, LAPAROSCOPIC
Anesthesia: General | Site: Abdomen

## 2020-03-11 MED ORDER — TRAMADOL HCL 50 MG PO TABS: 50.0000 mg | ORAL_TABLET | Freq: Four times a day (QID) | ORAL | 0 refills | Status: DC | PRN

## 2020-03-11 MED ORDER — CEFAZOLIN SODIUM-DEXTROSE 2-4 GM/100ML-% IV SOLN
2.0000 g | INTRAVENOUS | Status: AC
  Administered 2020-03-11: 2 g via INTRAVENOUS
  Filled 2020-03-11: qty 100

## 2020-03-11 MED ORDER — ORAL CARE MOUTH RINSE: 15.0000 mL | Freq: Once | OROMUCOSAL | Status: AC

## 2020-03-11 MED ORDER — 0.9 % SODIUM CHLORIDE (POUR BTL) OPTIME
TOPICAL | Status: DC | PRN
  Administered 2020-03-11: 1000 mL

## 2020-03-11 MED ORDER — ENSURE PRE-SURGERY PO LIQD
296.0000 mL | Freq: Once | ORAL | Status: DC
  Filled 2020-03-11: qty 296

## 2020-03-11 MED ORDER — LACTATED RINGERS IV SOLN: INTRAVENOUS | Status: DC

## 2020-03-11 MED ORDER — GABAPENTIN 300 MG PO CAPS
300.0000 mg | ORAL_CAPSULE | ORAL | Status: AC
  Administered 2020-03-11: 300 mg via ORAL
  Filled 2020-03-11: qty 1

## 2020-03-11 MED ORDER — ONDANSETRON HCL 4 MG/2ML IJ SOLN
INTRAMUSCULAR | Status: AC
  Filled 2020-03-11: qty 2

## 2020-03-11 MED ORDER — CHLORHEXIDINE GLUCONATE CLOTH 2 % EX PADS: 6.0000 | MEDICATED_PAD | Freq: Once | CUTANEOUS | Status: DC

## 2020-03-11 MED ORDER — LIDOCAINE 2% (20 MG/ML) 5 ML SYRINGE
INTRAMUSCULAR | Status: DC | PRN
  Administered 2020-03-11: 40 mg via INTRAVENOUS

## 2020-03-11 MED ORDER — BUPIVACAINE HCL 0.25 % IJ SOLN
INTRAMUSCULAR | Status: AC
  Filled 2020-03-11: qty 1

## 2020-03-11 MED ORDER — SUGAMMADEX SODIUM 200 MG/2ML IV SOLN
INTRAVENOUS | Status: DC | PRN
  Administered 2020-03-11: 200 mg via INTRAVENOUS

## 2020-03-11 MED ORDER — EPHEDRINE SULFATE-NACL 50-0.9 MG/10ML-% IV SOSY
PREFILLED_SYRINGE | INTRAVENOUS | Status: DC | PRN
  Administered 2020-03-11 (×2): 5 mg via INTRAVENOUS
  Administered 2020-03-11: 10 mg via INTRAVENOUS

## 2020-03-11 MED ORDER — BUPIVACAINE-EPINEPHRINE 0.25% -1:200000 IJ SOLN
INTRAMUSCULAR | Status: AC
  Filled 2020-03-11: qty 1

## 2020-03-11 MED ORDER — FENTANYL CITRATE (PF) 100 MCG/2ML IJ SOLN
INTRAMUSCULAR | Status: AC
  Filled 2020-03-11: qty 2

## 2020-03-11 MED ORDER — MIDAZOLAM HCL 2 MG/2ML IJ SOLN
INTRAMUSCULAR | Status: AC
  Filled 2020-03-11: qty 2

## 2020-03-11 MED ORDER — ACETAMINOPHEN 10 MG/ML IV SOLN
INTRAVENOUS | Status: DC | PRN
  Administered 2020-03-11: 1000 mg via INTRAVENOUS

## 2020-03-11 MED ORDER — BUPIVACAINE-EPINEPHRINE 0.25% -1:200000 IJ SOLN
INTRAMUSCULAR | Status: DC | PRN
  Administered 2020-03-11: 50 mL

## 2020-03-11 MED ORDER — DEXAMETHASONE SODIUM PHOSPHATE 10 MG/ML IJ SOLN
INTRAMUSCULAR | Status: AC
  Filled 2020-03-11: qty 1

## 2020-03-11 MED ORDER — LIDOCAINE HCL (PF) 2 % IJ SOLN
INTRAMUSCULAR | Status: DC | PRN
  Administered 2020-03-11: 1.5 mg/kg/h via INTRADERMAL

## 2020-03-11 MED ORDER — ROCURONIUM BROMIDE 10 MG/ML (PF) SYRINGE
PREFILLED_SYRINGE | INTRAVENOUS | Status: DC | PRN
  Administered 2020-03-11: 60 mg via INTRAVENOUS

## 2020-03-11 MED ORDER — KETAMINE HCL 10 MG/ML IJ SOLN
INTRAMUSCULAR | Status: AC
  Filled 2020-03-11: qty 1

## 2020-03-11 MED ORDER — PROPOFOL 10 MG/ML IV BOLUS
INTRAVENOUS | Status: AC
  Filled 2020-03-11: qty 20

## 2020-03-11 MED ORDER — LIDOCAINE HCL (PF) 2 % IJ SOLN
INTRAMUSCULAR | Status: AC
  Filled 2020-03-11: qty 5

## 2020-03-11 MED ORDER — MIDAZOLAM HCL 2 MG/2ML IJ SOLN
INTRAMUSCULAR | Status: DC | PRN
  Administered 2020-03-11 (×2): 1 mg via INTRAVENOUS

## 2020-03-11 MED ORDER — KETAMINE HCL 50 MG/ML IJ SOLN
INTRAMUSCULAR | Status: DC | PRN
  Administered 2020-03-11: 20 mg via INTRAMUSCULAR

## 2020-03-11 MED ORDER — CHLORHEXIDINE GLUCONATE 0.12 % MT SOLN
15.0000 mL | Freq: Once | OROMUCOSAL | Status: AC
  Administered 2020-03-11: 15 mL via OROMUCOSAL

## 2020-03-11 MED ORDER — ROCURONIUM BROMIDE 10 MG/ML (PF) SYRINGE
PREFILLED_SYRINGE | INTRAVENOUS | Status: AC
  Filled 2020-03-11: qty 10

## 2020-03-11 MED ORDER — EPHEDRINE 5 MG/ML INJ
INTRAVENOUS | Status: AC
  Filled 2020-03-11: qty 10

## 2020-03-11 MED ORDER — LIDOCAINE HCL 2 % IJ SOLN
INTRAMUSCULAR | Status: AC
  Filled 2020-03-11: qty 20

## 2020-03-11 MED ORDER — DEXAMETHASONE SODIUM PHOSPHATE 10 MG/ML IJ SOLN
INTRAMUSCULAR | Status: DC | PRN
  Administered 2020-03-11: 5 mg via INTRAVENOUS

## 2020-03-11 MED ORDER — ACETAMINOPHEN 10 MG/ML IV SOLN
INTRAVENOUS | Status: AC
  Filled 2020-03-11: qty 100

## 2020-03-11 MED ORDER — ACETAMINOPHEN 500 MG PO TABS
1000.0000 mg | ORAL_TABLET | ORAL | Status: AC
  Administered 2020-03-11: 1000 mg via ORAL
  Filled 2020-03-11: qty 2

## 2020-03-11 MED ORDER — PROPOFOL 10 MG/ML IV BOLUS
INTRAVENOUS | Status: DC | PRN
  Administered 2020-03-11: 150 mg via INTRAVENOUS

## 2020-03-11 MED ORDER — FENTANYL CITRATE (PF) 250 MCG/5ML IJ SOLN
INTRAMUSCULAR | Status: DC | PRN
  Administered 2020-03-11: 100 ug via INTRAVENOUS

## 2020-03-11 MED ORDER — BUPIVACAINE LIPOSOME 1.3 % IJ SUSP
INTRAMUSCULAR | Status: DC | PRN
  Administered 2020-03-11: 20 mL

## 2020-03-11 MED ORDER — ONDANSETRON HCL 4 MG/2ML IJ SOLN
INTRAMUSCULAR | Status: DC | PRN
  Administered 2020-03-11: 4 mg via INTRAVENOUS

## 2020-03-11 SURGICAL SUPPLY — 41 items
APL PRP STRL LF DISP 70% ISPRP (MISCELLANEOUS) ×1
CABLE HIGH FREQUENCY MONO STRZ (ELECTRODE) ×2 IMPLANT
CHLORAPREP W/TINT 26 (MISCELLANEOUS) ×2 IMPLANT
COVER SURGICAL LIGHT HANDLE (MISCELLANEOUS) ×2 IMPLANT
COVER WAND RF STERILE (DRAPES) IMPLANT
DECANTER SPIKE VIAL GLASS SM (MISCELLANEOUS) ×2 IMPLANT
DEVICE SECURE STRAP 25 ABSORB (INSTRUMENTS) IMPLANT
DRAPE WARM FLUID 44X44 (DRAPES) ×2 IMPLANT
DRSG TEGADERM 2-3/8X2-3/4 SM (GAUZE/BANDAGES/DRESSINGS) ×3 IMPLANT
DRSG TEGADERM 4X4.75 (GAUZE/BANDAGES/DRESSINGS) ×2 IMPLANT
ELECT REM PT RETURN 15FT ADLT (MISCELLANEOUS) ×2 IMPLANT
GAUZE SPONGE 2X2 8PLY STRL LF (GAUZE/BANDAGES/DRESSINGS) ×1 IMPLANT
GLOVE ECLIPSE 8.0 STRL XLNG CF (GLOVE) ×2 IMPLANT
GLOVE INDICATOR 8.0 STRL GRN (GLOVE) ×2 IMPLANT
GOWN STRL REUS W/TWL XL LVL3 (GOWN DISPOSABLE) ×4 IMPLANT
IRRIG SUCT STRYKERFLOW 2 WTIP (MISCELLANEOUS) ×2
IRRIGATION SUCT STRKRFLW 2 WTP (MISCELLANEOUS) IMPLANT
KIT BASIN OR (CUSTOM PROCEDURE TRAY) ×2 IMPLANT
KIT TURNOVER KIT A (KITS) ×2 IMPLANT
MESH HERNIA 6X6 BARD (Mesh General) IMPLANT
MESH HERNIA BARD 6X6 (Mesh General) ×1 IMPLANT
MESH ULTRAPRO 6X6 15CM15CM (Mesh General) ×2 IMPLANT
NDL INSUFFLATION 14GA 120MM (NEEDLE) IMPLANT
NEEDLE INSUFFLATION 14GA 120MM (NEEDLE) IMPLANT
PAD POSITIONING PINK XL (MISCELLANEOUS) ×2 IMPLANT
SCISSORS LAP 5X35 DISP (ENDOMECHANICALS) ×2 IMPLANT
SET TUBE SMOKE EVAC HIGH FLOW (TUBING) ×2 IMPLANT
SLEEVE ADV FIXATION 5X100MM (TROCAR) ×2 IMPLANT
SPONGE GAUZE 2X2 STER 10/PKG (GAUZE/BANDAGES/DRESSINGS) ×1
STRIP CLOSURE SKIN 1/2X4 (GAUZE/BANDAGES/DRESSINGS) ×1 IMPLANT
SUT MNCRL AB 4-0 PS2 18 (SUTURE) ×2 IMPLANT
SUT PDS AB 1 CT1 27 (SUTURE) ×4 IMPLANT
SUT VIC AB 2-0 SH 27 (SUTURE) ×4
SUT VIC AB 2-0 SH 27X BRD (SUTURE) IMPLANT
SUT VICRYL 0 UR6 27IN ABS (SUTURE) ×2 IMPLANT
TACKER 5MM HERNIA 3.5CML NAB (ENDOMECHANICALS) IMPLANT
TOWEL OR 17X26 10 PK STRL BLUE (TOWEL DISPOSABLE) ×2 IMPLANT
TOWEL OR NON WOVEN STRL DISP B (DISPOSABLE) ×2 IMPLANT
TRAY LAPAROSCOPIC (CUSTOM PROCEDURE TRAY) ×2 IMPLANT
TROCAR ADV FIXATION 5X100MM (TROCAR) ×2 IMPLANT
TROCAR XCEL BLUNT TIP 100MML (ENDOMECHANICALS) ×2 IMPLANT

## 2020-03-11 NOTE — H&P (Signed)
Antonio [name] DOB: May 05, 1940 Married / Language: English / Race: White Male  Patient Care Team: Leeroy Cha, MD as PCP - General (Internal Medicine) Michael Boston, MD as Consulting Physician (General Surgery) Tat, Eustace Quail, DO as Consulting Physician (Neurology) Renato Shin, MD as Consulting Physician (Endocrinology) Netta Cedars, MD as Consulting Physician (Orthopedic Surgery) Lorretta Harp, MD as Consulting Physician (Cardiology)  ` ` Patient sent for surgical consultation at the request of Leeroy Cha, MD  Chief Complaint: right groin swelling. Probable hernia. ` ` The patient is a 80-year-old gentleman who noted right groin swelling. Inguinal hernia suspected by his primary care physician. Surgical consultation offered. 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 ended up having a laparoscopic small bowel resection I confirmed lipoma by [facility] in 2013. Patient's wife wonder if he had had a prior hernia repair but he does not recall that. No other abdominal surgeries. Patient's had some intermittent sharp pains in the right groin this year. Lump became more obvious and is gotten larger. Patient and wife concerned. Patient does have to get up to urinate once at night. Sometimes sensation urination not great but no major hesitancy or frequency. Has erectile dysfunction for which he's been on medicine for. Looks like he does take Flomax as well. He is a diabetic. Had diarrhea on metformin but is better on the new her oral hypoglycemic. Sleep apnea uses CPAP. He does not smoke. He is able to do most daily chairs and work around the house but cannot walk more than a block or 2. Sounds like he's had some issues with gait and memory recall but no major neurological dysfunction according to his neurologist. Usually  moves his bowels at least once or twice a day.  (Review of systems as stated in this history (HPI) or in the review of systems. Otherwise all other 12 point ROS are negative) ` ` ###########################################`  This patient encounter took 45 minutes today to perform the following: obtain history, perform exam, review outside records, interpret tests & imaging, counsel the patient on their diagnosis; and, document this encounter, including findings & plan in the electronic health record (EHR).   Past Surgical History Illene Regulus, CMA; 12/30/2019 9:44 AM) Colon Polyp Removal - Colonoscopy Shoulder Surgery Left. Spinal Surgery - Lower Back Tonsillectomy Vasectomy  Diagnostic Studies History Illene Regulus, CMA; 12/30/2019 9:44 AM) Colonoscopy 5-10 years ago  Allergies Lars Mage Spillers, CMA; 12/30/2019 9:45 AM) No Known Drug Allergies [12/30/2019]:  Medication History (Alisha Spillers, CMA; 12/30/2019 9:46 AM) hydroCHLOROthiazide (25MG  Tablet, Oral) Active. Jardiance (25MG  Tablet, Oral) Active. Potassium Chloride Crys ER (20MEQ Tablet ER, Oral) Active. Tamsulosin HCl (0.4MG  Capsule, Oral) Active. Pepcid AC (10MG  Tablet, Oral) Active. Ferrous Sulfate (325 (65 Fe)MG Tablet, Oral) Active. Vitamin D (Cholecalciferol) (25 MCG(1000 UT) Capsule, Oral) Active. Medications Reconciled  Social History Illene Regulus, CMA; 12/30/2019 9:44 AM) Alcohol use Occasional alcohol use. Caffeine use Coffee. No drug use Tobacco use Never smoker.  Family History Illene Regulus, CMA; 12/30/2019 9:44 AM) Colon Cancer Mother.  Other Problems Illene Regulus, CMA; 12/30/2019 9:44 AM) Depression Diabetes Mellitus Gastroesophageal Reflux Disease Sleep Apnea     Review of Systems (Alisha Spillers CMA; 12/30/2019 9:44 AM) General Present- Fatigue. Not Present- Appetite Loss, Chills, Fever, Night Sweats, Weight Gain and Weight Loss. Skin Not  Present- Change in Wart/Mole, Dryness, Hives, Jaundice, New Lesions, Non-Healing Wounds, Rash and Ulcer. HEENT Present-  Hearing Loss, Ringing in the Ears and Wears glasses/contact lenses. Not Present- Earache, Hoarseness, Nose Bleed, Oral Ulcers, Seasonal Allergies, Sinus Pain, Sore Throat, Visual Disturbances and Yellow Eyes. Respiratory Not Present- Bloody sputum, Chronic Cough, Difficulty Breathing, Snoring and Wheezing. Cardiovascular Not Present- Chest Pain, Difficulty Breathing Lying Down, Leg Cramps, Palpitations, Rapid Heart Rate, Shortness of Breath and Swelling of Extremities. Gastrointestinal Not Present- Abdominal Pain, Bloating, Bloody Stool, Change in Bowel Habits, Chronic diarrhea, Constipation, Difficulty Swallowing, Excessive gas, Gets full quickly at meals, Hemorrhoids, Indigestion, Nausea, Rectal Pain and Vomiting. Male Genitourinary Not Present- Blood in Urine, Change in Urinary Stream, Frequency, Impotence, Nocturia, Painful Urination, Urgency and Urine Leakage. Musculoskeletal Present- Muscle Weakness. Not Present- Back Pain, Joint Pain, Joint Stiffness, Muscle Pain and Swelling of Extremities. Neurological Not Present- Decreased Memory, Fainting, Headaches, Numbness, Seizures, Tingling, Tremor, Trouble walking and Weakness.  Vitals (Alisha Spillers CMA; 12/30/2019 9:45 AM) 12/30/2019 9:44 AM Weight: 144 lb Height: 64.5in Body Surface Area: 1.71 m Body Mass Index: 24.34 kg/m  Pulse: 73 (Regular)  BP: 122/72(Sitting, Left Arm, Standard)        Physical Exam Adin Hector MD; 12/30/2019 10:15 AM)  General Mental Status-Alert. General Appearance-Not in acute distress, Not Sickly. Orientation-Oriented X3. Hydration-Well hydrated. Voice-Normal.  Integumentary Global Assessment Upon inspection and palpation of skin surfaces of the - Axillae: non-tender, no inflammation or ulceration, no drainage. and Distribution of scalp and body hair  is normal. General Characteristics Temperature - normal warmth is noted.  Head and Neck Head-normocephalic, atraumatic with no lesions or palpable masses. Face Global Assessment - atraumatic, no absence of expression. Neck Global Assessment - no abnormal movements, no bruit auscultated on the right, no bruit auscultated on the left, no decreased range of motion, non-tender. Trachea-midline. Thyroid Gland Characteristics - non-tender.  Eye Eyeball - Left-Extraocular movements intact, No Nystagmus - Left. Eyeball - Right-Extraocular movements intact, No Nystagmus - Right. Cornea - Left-No Hazy - Left. Cornea - Right-No Hazy - Right. Sclera/Conjunctiva - Left-No scleral icterus, No Discharge - Left. Sclera/Conjunctiva - Right-No scleral icterus, No Discharge - Right. Pupil - Left-Direct reaction to light normal. Pupil - Right-Direct reaction to light normal. Note: Wears glasses. Vision corrected  ENMT Ears Pinna - Left - no drainage observed, no generalized tenderness observed. Pinna - Right - no drainage observed, no generalized tenderness observed. Nose and Sinuses External Inspection of the Nose - no destructive lesion observed. Inspection of the nares - Left - quiet respiration. Inspection of the nares - Right - quiet respiration. Mouth and Throat Lips - Upper Lip - no fissures observed, no pallor noted. Lower Lip - no fissures observed, no pallor noted. Nasopharynx - no discharge present. Oral Cavity/Oropharynx - Tongue - no dryness observed. Oral Mucosa - no cyanosis observed. Hypopharynx - no evidence of airway distress observed. Note: mild hearing loss.  Chest and Lung Exam Inspection Movements - Normal and Symmetrical. Accessory muscles - No use of accessory muscles in breathing. Palpation Palpation of the chest reveals - Non-tender. Auscultation Breath sounds - Normal and Clear.  Cardiovascular Auscultation Rhythm - Regular. Murmurs &  Other Heart Sounds - Auscultation of the heart reveals - No Murmurs and No Systolic Clicks.  Abdomen Inspection Inspection of the abdomen reveals - No Visible peristalsis and No Abnormal pulsations. Umbilicus - No Bleeding, No Urine drainage. Palpation/Percussion Palpation and Percussion of the abdomen reveal - Soft, Non Tender, No Rebound tenderness, No Rigidity (guarding) and No Cutaneous hyperesthesia. Note: Abdomen soft. Nontender. Not distended. moderate-sized supraumbilical  diastases recti. Obese and thin-walledNo umbilical or incisional hernias. No guarding.  Male Genitourinary Sexual Maturity Tanner 5 - Adult hair pattern and Adult penile size and shape. Note: large right inguinal hernia going down to the scrotum partially. Smaller one on the left side. No testicular masses. Otherwise normal external genitalia  Peripheral Vascular Upper Extremity Inspection - Left - No Cyanotic nailbeds - Left, Not Ischemic. Inspection - Right - No Cyanotic nailbeds - Right, Not Ischemic.  Neurologic Neurologic evaluation reveals -normal attention span and ability to concentrate, able to name objects and repeat phrases. Appropriate fund of knowledge , normal sensation and normal coordination. Mental Status Affect - not angry, not paranoid. Cranial Nerves-Normal Bilaterally. Gait-Normal.  Neuropsychiatric Mental status exam performed with findings of-able to articulate well with normal speech/language, rate, volume and coherence, thought content normal with ability to perform basic computations and apply abstract reasoning and no evidence of hallucinations, delusions, obsessions or homicidal/suicidal ideation.  Musculoskeletal Global Assessment Spine, Ribs and Pelvis - no instability, subluxation or laxity. Right Upper Extremity - no instability, subluxation or laxity.  Lymphatic Head & Neck  General Head & Neck Lymphatics: Bilateral - Description - No Localized  lymphadenopathy. Axillary  General Axillary Region: Bilateral - Description - No Localized lymphadenopathy. Femoral & Inguinal  Generalized Femoral & Inguinal Lymphatics: Left - Description - No Localized lymphadenopathy. Right - Description - No Localized lymphadenopathy.    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 Sensitive but ultimately reducible. Smaller but definite left inguinal hernia as well.  he has somewhat decreased performance status but I think can tolerate hernia repair.  Would like get cardiac clearance. patient recall seeing Dr. Alvester Chou many years ago. Hemoglobin had a heart block issue around the time of his small bowel surgery 2015. Patient's wife sees Dr. Alvester Chou red regularly as well. We will ask for input.   DOE (DYSPNEA ON EXERTION) (R06.00) Impression: somewhat mildly decreased performance status picking tired postdated activities in this elderly gentleman. Has seen Dr. Alvester Chou with cardiology many years ago. Would like to double check there is no major cardiac concerns for surgery.  Current Plans I recommended obtaining preoperative cardiac clearance. I am concerned about the health of the patient and the ability to tolerate the operation. Therefore, we will request clearance by cardiology to better assess operative risk & see if a reevaluation, further workup, etc is needed. Also recommendations on how medications such as for anticoagulation and blood pressure should be managed/held/restarted after surgery.  OSA ON CPAP (G47.33)   DM TYPE 2 (DIABETES MELLITUS, TYPE 2) (E11.9)   PREOP - ING HERNIA - ENCOUNTER FOR PREOPERATIVE EXAMINATION FOR GENERAL SURGICAL PROCEDURE (Z01.818)  Current Plans You are being scheduled for surgery- Our schedulers will call  you.  You should hear from our office's scheduling department within 5 working days about the location, date, and time of surgery. We try to make accommodations for patient's preferences in scheduling surgery, but sometimes the OR schedule or the surgeon's schedule prevents [place] from making those accommodations.  If you have not heard from our office [phone]) in 5 working days, call the office and ask for your surgeon's nurse.  If you have other questions about your diagnosis, plan, or surgery, call the office and ask for your surgeon's nurse.  Written instructions provided The anatomy & physiology of the abdominal wall and pelvic  floor was discussed. The pathophysiology of hernias in the inguinal and pelvic region was discussed. Natural history risks such as progressive enlargement, pain, incarceration, and strangulation was discussed. Contributors to complications such as smoking, obesity, diabetes, prior surgery, etc were discussed.  I feel the risks of no intervention will lead to serious problems that outweigh the operative risks; therefore, I recommended surgery to reduce and repair the hernia. I explained laparoscopic techniques with possible need for an open approach. I noted usual use of mesh to patch and/or buttress hernia repair  Risks such as bleeding, infection, abscess, need for further treatment, heart attack, death, and other risks were discussed. I noted a good likelihood this will help address the problem. Goals of post-operative recovery were discussed as well. Possibility that this will not correct all symptoms was explained. I stressed the importance of low-impact activity, aggressive pain control, avoiding constipation, & not pushing through pain to minimize risk of post-operative chronic pain or injury. Possibility of reherniation was discussed. We will work to minimize complications.  An educational handout further explaining the pathology &  treatment options was given as well. Questions were answered. The patient expresses understanding & wishes to proceed with surgery.  Pt Education - Pamphlet Given - Laparoscopic Hernia Repair: discussed with patient and provided information. Pt Education - [facility] Pain Control (Nickcole Bralley) Pt Education - [facility] Hernia Post-Op HCI (Tylee Newby): discussed with patient and provided information. Pt Education - [facility] Mesh education: discussed with patient and provided information.  DIASTASIS RECTI (M62.08)  Current Plans Pt Education - [facility] Diastasis Recti: discussed with patient and provided information.  Adin Hector, MD, FACS, MASCRS Gastrointestinal and Minimally Invasive Surgery  [facility] [address], [place], [place] [address] [phone] Fax [phone] Main/Paging  CONTACT INFORMATION: Weekday (9AM-5PM) concerns: Call [facility] main office at [phone] Weeknight (5PM-9AM) or Weekend/Holiday concerns: Check [email or web address] for General Surgery [facility] coverage (Please, do not use SecureChat as it is not reliable communication to operating surgeons for immediate patient care)
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
## 2020-03-11 NOTE — Op Note (Signed)
03/11/2020  10:32 AM  PATIENT:  Antonio [name]  80 y.o. male  Patient Care Team: Leeroy Cha, MD as PCP - General (Internal Medicine) Michael Boston, MD as Consulting Physician (General Surgery) Tat, Eustace Quail, DO as Consulting Physician (Neurology) Renato Shin, MD as Consulting Physician (Endocrinology) Netta Cedars, MD as Consulting Physician (Orthopedic Surgery) Lorretta Harp, MD as Consulting Physician (Cardiology)  PRE-OPERATIVE DIAGNOSIS:   RIGHT SCROTAL INGUINAL HERNIA POSSIBLE INGUINAL HERNIA  POST-OPERATIVE DIAGNOSIS:   RIGHT SCROTAL INGUINAL HERNIA LEFT INGUINAL HERNIA INCISIONAL HERNIA  PROCEDURE:   LAPAROSCOPIC BILATERAL INGUINAL HERNIA REPAIR WITH MESH PRIMARY INCISIONAL HERNIA REPAIR TAP BLOCK - BILATERAL  SURGEON:  Adin Hector, MD  ASSISTANT: Eulis Foster, MD.  Jarrett Soho PGY 3   ANESTHESIA:     Regional ilioinguinal and genitofemoral and spermatic cord nerve blocks  General  Nerve block provided with liposomal bupivacaine (Experel) mixed with 0.25% bupivacaine as a Bilateral TAP block x 16mL each side at the level of the transverse abdominis & preperitoneal spaces along the flank at the anterior axillary line, from subcostal ridge to iliac crest under laparoscopic guidance    EBL:  Total I/O In: 1000 [I.V.:900; IV Piggyback:100] Out: 50 [Blood:50].  See anesthesia record  Delay start of Pharmacological VTE agent (>24hrs) due to surgical blood loss or risk of bleeding:  no  DRAINS: NONE  SPECIMEN:  Hernia sac & spermatic cord lipomas (not sent)  DISPOSITION OF SPECIMEN:  N/A  COUNTS:  YES  PLAN OF CARE: Discharge to home after PACU  PATIENT DISPOSITION:  PACU - hemodynamically stable.  INDICATION: Pleasant active gentleman with worsening hernia and right groin going down to scrotum.  Possible left side as well.  I recommended laparoscopic possible open repair.  The anatomy & physiology of the  abdominal wall and pelvic floor was discussed.  The pathophysiology of hernias in the inguinal and pelvic region was discussed.  Natural history risks such as progressive enlargement, pain, incarceration & strangulation was discussed.   Contributors to complications such as smoking, obesity, diabetes, prior surgery, etc were discussed.    I feel the risks of no intervention will lead to serious problems that outweigh the operative risks; therefore, I recommended surgery to reduce and repair the hernia.  I explained laparoscopic techniques with possible need for an open approach.  I noted usual use of mesh to patch and/or buttress hernia repair  Risks such as bleeding, infection, abscess, need for further treatment, heart attack, death, and other risks were discussed.  I noted a good likelihood this will help address the problem.   Goals of post-operative recovery were discussed as well.  Possibility that this will not correct all symptoms was explained.  I stressed the importance of low-impact activity, aggressive pain control, avoiding constipation, & not pushing through pain to minimize risk of post-operative chronic pain or injury. Possibility of reherniation was discussed.  We will work to minimize complications.     An educational handout further explaining the pathology & treatment options was given as well.  Questions were answered.  The patient expresses understanding & wishes to proceed with surgery.  OR FINDINGS: Large indirect inguinal hernia going down to scrotum on the right side.  No direct space femoral or obturator hernia.  On the left side smaller but definite indirect inguinal hernia as well.  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  Periumbilical incisional hernia 1 cm.  Primarily repaired  DESCRIPTION:  The patient was identified &  brought into the operating room. The patient was positioned supine with arms tucked. SCDs were active during the entire case.  The patient underwent general anesthesia without any difficulty.  The abdomen was prepped and draped in a sterile fashion. The patient's bladder was emptied.  A Surgical Timeout confirmed our plan.  I made a transverse incision through the inferior umbilical fold.  Initially encountered a periumbilical hernia inside of the wound incision.  About 1 cm in size.  We redirected a little more inferiorly in the left lateral in the paramedian region.  At that location, I made a small transverse nick through the anterior rectus fascia contralateral to the inguinal hernia side and placed a 0-vicryl stitch through the fascia.  I placed a Hasson trocar into the preperitoneal plane.  Entry was clean.  We induced carbon dioxide insufflation. Camera inspection revealed no injury.  I used a 30mm angled scope to bluntly free the peritoneum off the infraumbilical anterior abdominal wall.  I created enough of a preperitoneal pocket to place 1mm ports into the right & left mid-abdomen into this preperitoneal cavity.  I focused attention on the RIGHT pelvis since that was the dominant hernia side.   I used blunt & focused sharp dissection to free the peritoneum off the flank and down to the pubic rim.  I freed the anteriolateral bladder wall off the anteriolateral pelvic wall, sparing midline attachments.   I located a swath of peritoneum going into a hernia fascial defect at the  internal ring consistent with  an indirect inguinal hernia..  I gradually freed the peritoneal hernia sac off safely and reduced it into the preperitoneal space.  Patient had very large hernia sac sensibility in the scrotum.  Also a fair amount of spinal cord lipoma.  I freed the peritoneum off the spermatic vessels & vas deferens.  I freed peritoneum off the retroperitoneum along the psoas muscle.  Spermatic cord lipoma was dissected away & removed.  I excised the amount of redundant hernia sac and closed the peritoneal with 2-0 Vicryl laparoscopic  intracorporeal suturing.  I checked & assured hemostasis.     I turned attention on the opposite  LEFT pelvis.  I did dissection in a similar, mirror-image fashion. The patient had an indirect inguinal hernia.Marland Kitchen   Spermatic cord lipoma was dissected away & removed.    I checked & assured hemostasis.    I chose 15x15 cm sheets for each side.  On the right side because of the larger defect I used a standard Bard Marlex medium weight polypropylene mesh.  On the left side because it was not as significant, I chose ultra-lightweight polypropylene mesh (Ultrapro).  I cut a single sigmoid-shaped slit ~6cm from a corner of each mesh.  I placed the meshes into the preperitoneal space & laid them as overlapping diamonds such that at the inferior points, a 6x6 cm corner flap rested in the true anterolateral pelvis, covering the obturator & femoral foramina.   I allowed the bladder to return to the pubis, this helping tuck the corners of the mesh in the anteriolateral pelvis.  The medial corners overlapped each other across midline cephalad to the pubic rim.   Given the numerous hernias of moderate size, I placed a third 15x15cm mesh in the center as a vertical diamond.  The lateral wings of the mesh overlap across the direct spaces and internal rings where the dominant hernias were.  This provided good coverage and reinforcement of the hernia repairs.  Because of the central mesh placement with good overlap, I did not place any tacks.   I held the hernia sacs cephalad & evacuated carbon dioxide.  I closed the fascia with absorbable suture.  I closed the skin using 4-0 monocryl stitch.  Sterile dressings were applied.   The patient was extubated & arrived in the PACU in stable condition..  I had discussed postoperative care with the patient in the holding area.  Instructions are written in the chart.  I discussed operative findings, updated the patient's status, discussed probable steps to recovery, and gave postoperative  recommendations to the patient's spouse, [name].   Recommendations were made.  Questions were answered.  She expressed understanding & appreciation.   Adin Hector, M.D., F.A.C.S. Gastrointestinal and Minimally Invasive Surgery [facility] [address] [place], [place] [address] [phone] Main / Paging  03/11/2020 10:32 AM

## 2020-03-11 NOTE — Anesthesia Procedure Notes (Addendum)
Procedure Name: Intubation Date/Time: 03/11/2020 7:50 AM Performed by: Michele Rockers, CRNA Pre-anesthesia Checklist: Patient identified, Patient being monitored, Timeout performed, Emergency Drugs available and Suction available Patient Re-evaluated:Patient Re-evaluated prior to induction Oxygen Delivery Method: Circle System Utilized Preoxygenation: Pre-oxygenation with 100% oxygen Induction Type: IV induction Ventilation: Mask ventilation without difficulty Laryngoscope Size: [name] and 2 Grade View: Grade III Tube type: Oral Tube size: 7.5 mm Number of attempts: 1 Airway Equipment and Method: Stylet Placement Confirmation: ETT inserted through vocal cords under direct vision,  positive ETCO2 and breath sounds checked- equal and bilateral Secured at: 21 cm Tube secured with: Tape Dental Injury: Teeth and Oropharynx as per pre-operative assessment  Difficulty Due To: Difficult Airway- due to anterior larynx

## 2020-03-11 NOTE — Transfer of Care (Signed)
Immediate Anesthesia Transfer of Care Note  Patient: Antonio [name]  Procedure(s) Performed: LAPAROSCOPIC BILATERAL INGUINAL HERNIA REPAIR WITH MESH, TAP BLOCK (N/A Abdomen)  Patient Location: PACU  Anesthesia Type:General  Level of Consciousness: drowsy and patient cooperative  Airway & Oxygen Therapy: Patient Spontanous Breathing and Patient connected to face mask oxygen  Post-op Assessment: Report given to RN, Post -op Vital signs reviewed and stable and Patient moving all extremities X 4  Post vital signs: Reviewed and stable  Last Vitals:  Vitals Value Taken Time  BP 136/68 03/11/20 1036  Temp    Pulse 83 03/11/20 1038  Resp 11 03/11/20 1038  SpO2 100 % 03/11/20 1038  Vitals shown include unvalidated device data.  Last Pain:  Vitals:   03/11/20 0609  TempSrc:   PainSc: 0-No pain         Complications: No complications documented.

## 2020-03-11 NOTE — Discharge Instructions (Signed)
HERNIA REPAIR: POST OP INSTRUCTIONS ° °###################################################################### ° °EAT °Gradually transition to a high fiber diet with a fiber supplement over the next few weeks after discharge.  Start with a pureed / full liquid diet (see below) ° °WALK °Walk an hour a day.  Control your pain to do that.   ° °CONTROL PAIN °Control pain so that you can walk, sleep, tolerate sneezing/coughing, and go up/down stairs. ° °HAVE A BOWEL MOVEMENT DAILY °Keep your bowels regular to avoid problems.  OK to try a laxative to override constipation.  OK to use an antidairrheal to slow down diarrhea.  Call if not better after 2 tries ° °CALL IF YOU HAVE PROBLEMS/CONCERNS °Call if you are still struggling despite following these instructions. °Call if you have concerns not answered by these instructions ° °###################################################################### ° ° ° °1. DIET: Follow a light bland diet & liquids the first 24 hours after arrival home, such as soup, liquids, starches, etc.  Be sure to drink plenty of fluids.  Quickly advance to a usual solid diet within a few days.  Avoid fast food or heavy meals as your are more likely to get nauseated or have irregular bowels.  A low-fat, high-fiber diet for the rest of your life is ideal.  ° °2. Take your usually prescribed home medications unless otherwise directed. ° °3. PAIN CONTROL: °a. Pain is best controlled by a usual combination of three different methods TOGETHER: °i. Ice/Heat °ii. Over the counter pain medication °iii. Prescription pain medication °b. Most patients will experience some swelling and bruising around the hernia(s) such as the bellybutton, groins, or old incisions.  Ice packs or heating pads (30-60 minutes up to 6 times a day) will help. Use ice for the first few days to help decrease swelling and bruising, then switch to heat to help relax tight/sore spots and speed recovery.  Some people prefer to use ice  alone, heat alone, alternating between ice & heat.  Experiment to what works for you.  Swelling and bruising can take several weeks to resolve.   °c. It is helpful to take an over-the-counter pain medication regularly for the first few weeks.  Choose one of the following that works best for you: °i. Naproxen (Aleve, etc)  Two 220mg tabs twice a day °ii. Ibuprofen (Advil, etc) Three 200mg tabs four times a day (every meal & bedtime) °iii. Acetaminophen (Tylenol, etc) 325-650mg four times a day (every meal & bedtime) °d. A  prescription for pain medication should be given to you upon discharge.  Take your pain medication as prescribed.  °i. If you are having problems/concerns with the prescription medicine (does not control pain, nausea, vomiting, rash, itching, etc), please call us [phone] to see if we need to switch you to a different pain medicine that will work better for you and/or control your side effect better. °ii. If you need a refill on your pain medication, please contact your pharmacy.  They will contact our office to request authorization. Prescriptions will not be filled after 5 pm or on week-ends. ° °4. Avoid getting constipated.  Between the surgery and the pain medications, it is common to experience some constipation.  Increasing fluid intake and taking a fiber supplement (such as Metamucil, Citrucel, FiberCon, MiraLax, etc) 1-2 times a day regularly will usually help prevent this problem from occurring.  A mild laxative (prune juice, Milk of Magnesia, MiraLax, etc) should be taken according to package directions if there are no bowel movements after 48   hours.   ° °5. Wash / shower every day.  You may shower over the dressings as they are waterproof.   ° °6. Remove your waterproof bandages, skin tapes, and other bandages 3 days after surgery. You may replace a dressing/Band-Aid to cover the incision for comfort if you wish. You may leave the incisions open to air.  You may replace a  dressing/Band-Aid to cover an incision for comfort if you wish.  Continue to shower over incision(s) after the dressing is off. ° °7. ACTIVITIES as tolerated:   °a. You may resume regular (light) daily activities beginning the next day--such as daily self-care, walking, climbing stairs--gradually increasing activities as tolerated.  Control your pain so that you can walk an hour a day.  If you can walk 30 minutes without difficulty, it is safe to try more intense activity such as jogging, treadmill, bicycling, low-impact aerobics, swimming, etc. °b. Save the most intensive and strenuous activity for last such as sit-ups, heavy lifting, contact sports, etc  Refrain from any heavy lifting or straining until you are off narcotics for pain control.   °c. DO NOT PUSH THROUGH PAIN.  Let pain be your guide: If it hurts to do something, don't do it.  Pain is your body warning you to avoid that activity for another week until the pain goes down. °d. You may drive when you are no longer taking prescription pain medication, you can comfortably wear a seatbelt, and you can safely maneuver your car and apply brakes. °e. You may have sexual intercourse when it is comfortable.  ° °8. FOLLOW UP in our office °a. Please call [facility] at [phone] to set up an appointment to see your surgeon in the office for a follow-up appointment approximately 2-3 weeks after your surgery. °b. Make sure that you call for this appointment the day you arrive home to insure a convenient appointment time. ° °9.  If you have disability of FMLA / Family leave forms, please bring the forms to the office for processing.  (do not give to your surgeon). ° °WHEN TO CALL US [phone]: °1. Poor pain control °2. Reactions / problems with new medications (rash/itching, nausea, etc)  °3. Fever over 101.5 F (38.5 C) °4. Inability to urinate °5. Nausea and/or vomiting °6. Worsening swelling or bruising °7. Continued bleeding from incision. °8. Increased pain,  redness, or drainage from the incision ° ° The clinic staff is available to answer your questions during regular business hours (8:30am-5pm).  Please don’t hesitate to call and ask to speak to one of our nurses for clinical concerns.  ° If you have a medical emergency, go to the nearest emergency room or call 911. ° A surgeon from [facility] is always on call at the hospitals in [place] ° °[facility] °[address], [address], [place], [place]  [address] ? ° [address] [address], , [place]   [address] °MAIN: [phone] ? TOLL FREE: [phone] ? FAX: [phone] °[email or web address] ° °

## 2020-03-11 NOTE — Progress Notes (Signed)
Patient very drowsy after surgery/ slow to wake up. Patient states he feels very weak and "foggy." Wife states he usually takes a long time to wake up from anesthesia and this happened last time he had surgery.  Dr. Ambrose Pancoast called to assess patient's ability to go home safely. BP 90-100s/ 50s. Patient states he does not feel any dizziness with standing. Wife at bedside and confirms she is okay to take care of him at home.   Eugene Garnet, RN

## 2020-03-12 ENCOUNTER — Encounter (HOSPITAL_COMMUNITY): Payer: Self-pay | Admitting: Surgery

## 2020-03-12 NOTE — Anesthesia Postprocedure Evaluation (Signed)
Anesthesia Post Note  Patient: Antonio [name]  Procedure(s) Performed: LAPAROSCOPIC BILATERAL INGUINAL HERNIA REPAIR WITH MESH, TAP BLOCK (N/A Abdomen)     Patient location during evaluation: PACU Anesthesia Type: General Level of consciousness: awake and alert Pain management: pain level controlled Vital Signs Assessment: post-procedure vital signs reviewed and stable Respiratory status: spontaneous breathing, nonlabored ventilation, respiratory function stable and patient connected to nasal cannula oxygen Cardiovascular status: blood pressure returned to baseline and stable Postop Assessment: no apparent nausea or vomiting Anesthetic complications: no   No complications documented.  Last Vitals:  Vitals:   03/11/20 1600 03/11/20 1615  BP: 91/60 108/63  Pulse: (!) 106 99  Resp:  20  Temp:  36.6 C  SpO2: 99% 99%    Last Pain:  Vitals:   03/11/20 1615  TempSrc:   PainSc: 3                  Lux Meaders P Caterina Racine

## 2020-03-15 ENCOUNTER — Other Ambulatory Visit: Payer: Self-pay | Admitting: Physician Assistant

## 2020-03-26 ENCOUNTER — Other Ambulatory Visit: Payer: Self-pay

## 2020-03-29 ENCOUNTER — Inpatient Hospital Stay (HOSPITAL_COMMUNITY)
Admission: EM | Admit: 2020-03-29 | Discharge: 2020-04-01 | DRG: 418 | Disposition: A | Discharge status: Home / Self Care | Payer: Medicare Other | Attending: Internal Medicine | Admitting: Internal Medicine

## 2020-03-29 ENCOUNTER — Emergency Department (HOSPITAL_COMMUNITY): Payer: Medicare Other

## 2020-03-29 ENCOUNTER — Encounter (HOSPITAL_COMMUNITY): Payer: Self-pay

## 2020-03-29 ENCOUNTER — Other Ambulatory Visit: Payer: Self-pay

## 2020-03-29 DIAGNOSIS — Z96612 Presence of left artificial shoulder joint: Secondary | ICD-10-CM | POA: Diagnosis present

## 2020-03-29 DIAGNOSIS — E1122 Type 2 diabetes mellitus with diabetic chronic kidney disease: Secondary | ICD-10-CM | POA: Diagnosis present

## 2020-03-29 DIAGNOSIS — R413 Other amnesia: Secondary | ICD-10-CM | POA: Diagnosis present

## 2020-03-29 DIAGNOSIS — R7989 Other specified abnormal findings of blood chemistry: Secondary | ICD-10-CM

## 2020-03-29 DIAGNOSIS — Z66 Do not resuscitate: Secondary | ICD-10-CM | POA: Diagnosis present

## 2020-03-29 DIAGNOSIS — K819 Cholecystitis, unspecified: Secondary | ICD-10-CM | POA: Diagnosis not present

## 2020-03-29 DIAGNOSIS — F0390 Unspecified dementia without behavioral disturbance: Secondary | ICD-10-CM | POA: Diagnosis present

## 2020-03-29 DIAGNOSIS — I129 Hypertensive chronic kidney disease with stage 1 through stage 4 chronic kidney disease, or unspecified chronic kidney disease: Secondary | ICD-10-CM | POA: Diagnosis present

## 2020-03-29 DIAGNOSIS — G8929 Other chronic pain: Secondary | ICD-10-CM | POA: Diagnosis present

## 2020-03-29 DIAGNOSIS — E119 Type 2 diabetes mellitus without complications: Secondary | ICD-10-CM

## 2020-03-29 DIAGNOSIS — K8020 Calculus of gallbladder without cholecystitis without obstruction: Secondary | ICD-10-CM

## 2020-03-29 DIAGNOSIS — K219 Gastro-esophageal reflux disease without esophagitis: Secondary | ICD-10-CM | POA: Diagnosis present

## 2020-03-29 DIAGNOSIS — K8042 Calculus of bile duct with acute cholecystitis without obstruction: Secondary | ICD-10-CM | POA: Diagnosis present

## 2020-03-29 DIAGNOSIS — K7589 Other specified inflammatory liver diseases: Secondary | ICD-10-CM | POA: Diagnosis present

## 2020-03-29 DIAGNOSIS — Z8249 Family history of ischemic heart disease and other diseases of the circulatory system: Secondary | ICD-10-CM

## 2020-03-29 DIAGNOSIS — N1831 Chronic kidney disease, stage 3a: Secondary | ICD-10-CM | POA: Diagnosis present

## 2020-03-29 DIAGNOSIS — Y838 Other surgical procedures as the cause of abnormal reaction of the patient, or of later complication, without mention of misadventure at the time of the procedure: Secondary | ICD-10-CM | POA: Diagnosis present

## 2020-03-29 DIAGNOSIS — E785 Hyperlipidemia, unspecified: Secondary | ICD-10-CM | POA: Diagnosis present

## 2020-03-29 DIAGNOSIS — K8067 Calculus of gallbladder and bile duct with acute and chronic cholecystitis with obstruction: Secondary | ICD-10-CM | POA: Diagnosis not present

## 2020-03-29 DIAGNOSIS — Z825 Family history of asthma and other chronic lower respiratory diseases: Secondary | ICD-10-CM

## 2020-03-29 DIAGNOSIS — G4733 Obstructive sleep apnea (adult) (pediatric): Secondary | ICD-10-CM | POA: Diagnosis present

## 2020-03-29 DIAGNOSIS — Z20822 Contact with and (suspected) exposure to covid-19: Secondary | ICD-10-CM | POA: Diagnosis present

## 2020-03-29 DIAGNOSIS — K4090 Unilateral inguinal hernia, without obstruction or gangrene, not specified as recurrent: Secondary | ICD-10-CM

## 2020-03-29 DIAGNOSIS — Z85820 Personal history of malignant melanoma of skin: Secondary | ICD-10-CM

## 2020-03-29 DIAGNOSIS — Z833 Family history of diabetes mellitus: Secondary | ICD-10-CM

## 2020-03-29 DIAGNOSIS — N99841 Postprocedural hematoma of a genitourinary system organ or structure following other procedure: Secondary | ICD-10-CM | POA: Diagnosis present

## 2020-03-29 DIAGNOSIS — E876 Hypokalemia: Secondary | ICD-10-CM | POA: Diagnosis present

## 2020-03-29 DIAGNOSIS — Z800 Family history of malignant neoplasm of digestive organs: Secondary | ICD-10-CM

## 2020-03-29 DIAGNOSIS — N400 Enlarged prostate without lower urinary tract symptoms: Secondary | ICD-10-CM | POA: Diagnosis present

## 2020-03-29 LAB — LIPASE, BLOOD: Lipase: 191 U/L — ABNORMAL HIGH (ref 11–51)

## 2020-03-29 LAB — CBC
HCT: 35.2 % — ABNORMAL LOW (ref 39.0–52.0)
Hemoglobin: 11.5 g/dL — ABNORMAL LOW (ref 13.0–17.0)
MCH: 30.5 pg (ref 26.0–34.0)
MCHC: 32.7 g/dL (ref 30.0–36.0)
MCV: 93.4 fL (ref 80.0–100.0)
Platelets: 383 10*3/uL (ref 150–400)
RBC: 3.77 MIL/uL — ABNORMAL LOW (ref 4.22–5.81)
RDW: 13 % (ref 11.5–15.5)
WBC: 12.6 10*3/uL — ABNORMAL HIGH (ref 4.0–10.5)
nRBC: 0 % (ref 0.0–0.2)

## 2020-03-29 LAB — URINALYSIS, ROUTINE W REFLEX MICROSCOPIC
Bilirubin Urine: NEGATIVE
Glucose, UA: NEGATIVE mg/dL
Hgb urine dipstick: NEGATIVE
Ketones, ur: NEGATIVE mg/dL
Leukocytes,Ua: NEGATIVE
Nitrite: NEGATIVE
Protein, ur: NEGATIVE mg/dL
Specific Gravity, Urine: 1.032 — ABNORMAL HIGH (ref 1.005–1.030)
pH: 7 (ref 5.0–8.0)

## 2020-03-29 LAB — COMPREHENSIVE METABOLIC PANEL
ALT: 300 U/L — ABNORMAL HIGH (ref 0–44)
AST: 683 U/L — ABNORMAL HIGH (ref 15–41)
Albumin: 3.7 g/dL (ref 3.5–5.0)
Alkaline Phosphatase: 454 U/L — ABNORMAL HIGH (ref 38–126)
Anion gap: 14 (ref 5–15)
BUN: 27 mg/dL — ABNORMAL HIGH (ref 8–23)
CO2: 23 mmol/L (ref 22–32)
Calcium: 8.7 mg/dL — ABNORMAL LOW (ref 8.9–10.3)
Chloride: 101 mmol/L (ref 98–111)
Creatinine, Ser: 1.18 mg/dL (ref 0.61–1.24)
GFR, Estimated: >60 mL/min (ref >60)
Glucose, Bld: 207 mg/dL — ABNORMAL HIGH (ref 70–99)
Potassium: 3 mmol/L — ABNORMAL LOW (ref 3.5–5.1)
Sodium: 138 mmol/L (ref 135–145)
Total Bilirubin: 3.1 mg/dL — ABNORMAL HIGH (ref 0.3–1.2)
Total Protein: 7 g/dL (ref 6.5–8.1)

## 2020-03-29 MED ORDER — IOHEXOL 300 MG/ML  SOLN
100.0000 mL | Freq: Once | INTRAMUSCULAR | Status: AC | PRN
  Administered 2020-03-29: 100 mL via INTRAVENOUS

## 2020-03-29 MED ORDER — SODIUM CHLORIDE 0.9 % IV BOLUS
500.0000 mL | Freq: Once | INTRAVENOUS | Status: AC
  Administered 2020-03-29: 500 mL via INTRAVENOUS

## 2020-03-29 NOTE — ED Triage Notes (Signed)
Pt c/o epigastric pain starting at 0300am. States pain is where "rib cage meets the sternum". States he had 1 episode of vomit this morning. Hx of hernia surgeries. Denies cardiac hx

## 2020-03-29 NOTE — ED Notes (Signed)
Pt to ct via stretcher

## 2020-03-29 NOTE — ED Provider Notes (Signed)
[facility] DEPT Provider Note   CSN: [id number] Arrival date & time: 03/29/20  1713     History Chief Complaint  Patient presents with  . Abdominal Pain    Antonio [name] is a 80 y.o. male.  HPI 80 year old male with a history of anemia, BPH, skin cancer, DM type II, depression, hyperlipidemia presents to the ER with complaints of epigastric pain which has been waxing and waning since yesterday.  Patient states he had onset of epigastric pain early in the morning at 3 AM, which lasted for several hours but then subsided.  He states he felt better, and then had some [employer] at around 10 AM.  He states that the pain then returned, sharp and stabbing and fairly severe.  He had one episode of nonbloody nonbilious vomiting earlier this morning.  He  takes Pepcid daily, this did not seem to help his pain.  He recently states that he had laparoscopic bilateral inguinal hernia repair on 03/11/2020.  He had follow-up with his general surgeon earlier today.    Denies any chest pain or syncope.  He does still have his gallbladder.    Past Medical History:  Diagnosis Date  . Anemia    hx  . BPH (benign prostatic hyperplasia)   . 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 Chronic back pain   . Depression   . Diabetes mellitus without complication (HCC)    not on meds   . GERD (gastroesophageal reflux disease)   . Heart murmur   . History of carpal tunnel syndrome    Bilateral  . Hyperlipidemia    "borderline"  . Hypertension   . Leg edema, right   . OSA on CPAP   . RBBB 01/11/2016   Noted on EKG    Patient Active Problem List   Diagnosis Date Noted  . Abnormal gait 12/30/2019  . Bilateral sensorineural hearing loss 12/30/2019  . Chronic kidney disease, stage 3a ([place]) 12/30/2019  . Daytime hypersomnia 12/30/2019  . Diabetic renal disease ([place]) 12/30/2019  . Dyslipidemia 12/30/2019  . ED (erectile dysfunction)  of organic origin 12/30/2019  . Hypertension 12/30/2019  . Iron deficiency anemia 12/30/2019  . Low back pain 12/30/2019  . Memory loss 12/30/2019  . Mild depression ([place]) 12/30/2019  . Obstructive sleep apnea syndrome 12/30/2019  . Peripheral venous insufficiency 12/30/2019  . Prostatic hypertrophy 12/30/2019  . 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 Second degree burn of lower limb 12/30/2019  . Skin sensation disturbance 12/30/2019  . Vitamin D deficiency 12/30/2019  . 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  . Scrotal hernia - right 12/30/2019  . Bilateral inguinal hernia (BIH) 12/30/2019  . Diabetes ([place]) 09/13/2019  . Bilateral impacted cerumen 08/01/2019  . Foreign body in auricle of right ear 08/01/2019  . H/O total shoulder replacement, 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  . Major depressive disorder, recurrent episode, moderate ([place]) 01/06/2018  . Hyperlipidemia   . HNP (herniated nucleus pulposus), lumbar 12/26/2012    Past Surgical History:  Procedure Laterality Date  . CARPAL TUNNEL RELEASE Bilateral   . INGUINAL HERNIA REPAIR N/A 03/11/2020   Procedure: LAPAROSCOPIC BILATERAL INGUINAL HERNIA REPAIR WITH MESH, TAP BLOCK;  Surgeon: Michael Boston, MD;  Location: [facility];  Service: General;  Laterality: N/A;  . LUMBAR LAMINECTOMY/DECOMPRESSION MICRODISCECTOMY  09/06/2011   Procedure: LUMBAR LAMINECTOMY/DECOMPRESSION MICRODISCECTOMY 1 LEVEL;  Surgeon: Hosie Spangle, MD;  Location: [facility];  Service: Neurosurgery;  Laterality: Left;  Left lumbar three-four Lumbar Laminotomy/microdiskectomy  . LUMBAR LAMINECTOMY/DECOMPRESSION MICRODISCECTOMY Left 12/26/2012   Procedure: LUMBAR LAMINECTOMY/DECOMPRESSION MICRODISCECTOMY LEFT LUMBAR THREE-FOUR ;  Surgeon: Hosie Spangle, MD;  Location: [facility];  Service: Neurosurgery;  Laterality: Left;  . POLYPECTOMY     removed small intestine  . REVERSE SHOULDER ARTHROPLASTY Left 07/11/2019   Procedure:  REVERSE SHOULDER ARTHROPLASTY;  Surgeon: Netta Cedars, MD;  Location: [facility];  Service: Orthopedics;  Laterality: Left;  interscalene block  . ROTATOR CUFF REPAIR Left   . TONSILLECTOMY         Family History  Problem Relation Age of Onset  . Colon cancer Mother   . COPD Father   . Diabetes Mellitus I Brother   . Hypertension Daughter     Social History   Tobacco Use  . Smoking status: Never Smoker  . Smokeless tobacco: Never Used  Vaping Use  . Vaping Use: Never used  Substance Use Topics  . Alcohol use: No  . Drug use: No    Home Medications Prior to Admission medications   Medication Sig Start Date End Date Taking? Authorizing Provider  cholecalciferol (VITAMIN D) 1000 units tablet Take 1,000 Units by mouth in the morning and at bedtime.     [provider]  ELDERBERRY PO Take 2 each by mouth daily. Gummies    [provider]  empagliflozin (JARDIANCE) 25 MG TABS tablet Take 1 tablet (25 mg total) by mouth daily before breakfast. 02/10/20   Renato Shin, MD  famotidine (PEPCID) 10 MG tablet Take 10 mg by mouth daily.     [provider]  ferrous sulfate 325 (65 FE) MG tablet Take 325 mg by mouth daily with breakfast.    [provider]  hydrochlorothiazide (HYDRODIURIL) 25 MG tablet Take 25 mg by mouth daily.    [provider]  lamoTRIgine (LAMICTAL) 25 MG tablet TAKE 1 TABLET BY MOUTH  DAILY 03/16/20   [name] T, PA-C  potassium chloride SA (K-DUR,KLOR-CON) 20 MEQ tablet Take 20 mEq by mouth daily.  07/30/17   [provider]  Tamsulosin HCl (FLOMAX) 0.4 MG CAPS Take 1 capsule (0.4 mg total) by mouth daily after breakfast. 09/08/11   Kary Kos, MD  traMADol (ULTRAM) 50 MG tablet Take 1-2 tablets (50-100 mg total) by mouth every 6 (six) hours as needed for moderate pain or severe pain. 03/11/20   Michael Boston, MD    Allergies    Metformin and related, Wellbutrin [bupropion], and Zoloft [sertraline]  Review of  Systems   Review of Systems  Constitutional: Negative for chills and fever.  HENT: Negative for ear pain and sore throat.   Eyes: Negative for pain and visual disturbance.  Respiratory: Negative for cough and shortness of breath.   Cardiovascular: Negative for chest pain and palpitations.  Gastrointestinal: Positive for abdominal pain, nausea and vomiting. Negative for diarrhea.  Genitourinary: Negative for dysuria and hematuria.  Musculoskeletal: Negative for arthralgias and back pain.  Skin: Negative for color change and rash.  Neurological: Negative for seizures and syncope.  All other systems reviewed and are negative.   Physical Exam Updated Vital Signs BP 127/70   Pulse (!) 112   Temp (!) 97.5 F (36.4 C) (Oral)   Resp (!) 23   Ht _0  (1.651 m)   Wt 65.8 kg   SpO2 96%   BMI 24.13 kg/m   Physical Exam Vitals and nursing note reviewed.  Constitutional:  General: He is not in acute distress.    Appearance: He is well-developed and well-nourished. He is not ill-appearing, toxic-appearing or diaphoretic.  HENT:     Head: Normocephalic and atraumatic.  Eyes:     Conjunctiva/sclera: Conjunctivae normal.  Cardiovascular:     Rate and Rhythm: Normal rate and regular rhythm.     Heart sounds: No murmur heard.   Pulmonary:     Effort: Pulmonary effort is normal. No respiratory distress.     Breath sounds: Normal breath sounds.  Abdominal:     Palpations: Abdomen is soft.     Tenderness: There is abdominal tenderness in the epigastric area. There is no right CVA tenderness, left CVA tenderness or guarding. Negative signs include Murphy's sign and McBurney's sign.     Comments: Mild to moderate epigastric tenderness on exam.  No right upper quadrant tenderness, negative Murphy's.  Negative McBurney's, no flank tenderness.  Incisions to the lower abdomen well-healed over, no surrounding erythema, fluctuance, drainage  Musculoskeletal:        General: No edema.      Cervical back: Neck supple.  Skin:    General: Skin is warm and dry.  Neurological:     General: No focal deficit present.     Mental Status: He is alert.  Psychiatric:        Mood and Affect: Mood and affect and mood normal.        Behavior: Behavior normal.     ED Results / Procedures / Treatments   Labs (all labs ordered are listed, but only abnormal results are displayed) Labs Reviewed  LIPASE, BLOOD - Abnormal; Notable for the following components:      Result Value   Lipase 191 (*)    All other components within normal limits  COMPREHENSIVE METABOLIC PANEL - Abnormal; Notable for the following components:   Potassium 3.0 (*)    Glucose, Bld 207 (*)    BUN 27 (*)    Calcium 8.7 (*)    AST 683 (*)    ALT 300 (*)    Alkaline Phosphatase 454 (*)    Total Bilirubin 3.1 (*)    All other components within normal limits  CBC - Abnormal; Notable for the following components:   WBC 12.6 (*)    RBC 3.77 (*)    Hemoglobin 11.5 (*)    HCT 35.2 (*)    All other components within normal limits  URINALYSIS, ROUTINE W REFLEX MICROSCOPIC - Abnormal; Notable for the following components:   Color, Urine AMBER (*)    Specific Gravity, Urine 1.032 (*)    All other components within normal limits    EKG None  Radiology CT ABDOMEN PELVIS W CONTRAST  Result Date: 03/29/2020 CLINICAL DATA:  Epigastric pain since 3 a.m., vomiting EXAM: CT ABDOMEN AND PELVIS WITH CONTRAST TECHNIQUE: Multidetector CT imaging of the abdomen and pelvis was performed using the standard protocol following bolus administration of intravenous contrast. CONTRAST:  130m OMNIPAQUE IOHEXOL 300 MG/ML  SOLN COMPARISON:  At 2613 FINDINGS: Lower chest: No acute pleural or parenchymal lung disease. Hepatobiliary: No focal liver abnormality is seen. No gallstones, gallbladder wall thickening, or biliary dilatation. Pancreas: Unremarkable. No pancreatic ductal dilatation or surrounding inflammatory changes. Spleen: Normal in  size without focal abnormality. Adrenals/Urinary Tract: Numerous nonobstructing bilateral renal calculi are identified, measuring up to 5 mm on the right and 4 mm on the left. No evidence of obstructive uropathy within either kidney. Progressive areas of bilateral renal cortical  scarring and thinning. The adrenals and bladder are unremarkable. Stomach/Bowel: No bowel obstruction or ileus. Diverticulosis of the distal colon with no evidence of acute diverticulitis. Normal appendix right lower quadrant. Postsurgical changes from previous small bowel resection. Vascular/Lymphatic: Aortic atherosclerosis. No enlarged abdominal or pelvic lymph nodes. Reproductive: Prostate is mildly enlarged measuring 4.8 x 4.2 cm. Other: There is a complex fluid collection within the right inguinal canal extending into the right hemiscrotum, measuring approximately 8.6 x 7.4 x 10.6 cm in size. Overall this likely reflects a right inguinal hernia containing mesenteric fat and fluid, with concerns for an incarcerated hernia. No bowel herniation is identified. No free intraperitoneal fluid or free gas. No other abdominal wall hernia. Musculoskeletal: Chronic appearing L1 compression fracture. No other acute bony abnormalities. Reconstructed images demonstrate no additional findings. IMPRESSION: 1. Large incarcerated fat containing right inguinal hernia, with significant fluid in the hernia sac. No bowel herniation. 2. Bilateral nonobstructing renal calculi, with significant bilateral renal cortical scarring and atrophy. 3. Sigmoid diverticulosis without diverticulitis. 4.  Aortic Atherosclerosis (ICD10-I70.0). Electronically Signed   By: Randa Ngo M.D.   On: 03/29/2020 23:05    Procedures Procedures   Medications Ordered in ED Medications  iohexol (OMNIPAQUE) 300 MG/ML solution 100 mL (100 mLs Intravenous Contrast Given 03/29/20 2240)  sodium chloride 0.9 % bolus 500 mL (500 mLs Intravenous New Bag/Given 03/29/20 2331)    ED  Course  I have reviewed the triage vital signs and the nursing notes.  Pertinent labs & imaging results that were available during my care of the patient were reviewed by me and considered in my medical decision making (see chart for details).  Clinical Course as of 03/29/20 2355  Mon Mar 29, 2020  2129 Potassium(!): 3.0 [MB]  2129 AST(!): 683 [MB]  2129 ALT(!): 300 [MB]  2129 Alkaline Phosphatase(!): 454 [MB]  2129 Total Bilirubin(!): 3.1 [MB]  2129 Lipase(!): 191 [MB]  2129 WBC(!): 12.6 Patient presents to the ER with complaints of 2 days of epigastric pain which has been waxing and waning.  On arrival, he is alert, oriented, nontoxic-appearing, no acute distress, resting comfortably in the ER bed.  Vitals on arrival overall reassuring, blood pressure stable, afebrile, mildly tachycardic with a rate of 105, no evidence of hypoxia.  Sickle exam with epigastric tenderness, negative Murphy's, no right upper quadrant tenderness, no flank tenderness bilaterally.  No evidence of infection to his incisions.  DDx includes GERD, hernia, cholecystitis/cholangitis, less likely ACS, dissection  Labs ordered in triage, reviewed by me.  CBC with a leukocytosis of 12.6, hemoglobin 11.5 which appears stable.  Most notably, patient's LFTs are significantly elevated from baseline, 683 and 300 respectively.  His alk phos is 454, total bili is 3.1.  Normal renal function.  Potassium of 3.  Lipase of 191  Patient reports he is currently pain-free in the ER.  Given the significant elevated LFTs, total bili, plan for CT abdomen of the pelvis to further evaluate for possible gallbladder etiology [MB]  2311 CT ABDOMEN PELVIS W CONTRAST  IMPRESSION: 1. Large incarcerated fat containing right inguinal hernia, with significant fluid in the hernia sac. No bowel herniation. 2. Bilateral nonobstructing renal calculi, with significant bilateral renal cortical scarring and atrophy. 3. Sigmoid diverticulosis without  diverticulitis. 4. Aortic Atherosclerosis (ICD10-I70.0). [MB]  2353 Patient remains pain-free in the ER.  CT of the abdomen shows large fat-containing hernia, question this given recent surgery.  Regardless, this is fat-containing, likely not amenable to additional surgery.  As per  discussion with Dr. Roderic Palau, will order right upper quadrant ultrasound for further, he likely will need admission with consult GI in the morning.  Signed out care to Antonio [name] who will oversee the rest of his care and admit    This was a shared visit with my supervising physician Dr. Roderic Palau who independently saw and evaluated the patient & provided guidance in evaluation/management/disposition ,in agreement with care  [MB]    Clinical Course User Index [MB] Antonio [name]   MDM Rules/Calculators/A&P                           Final Clinical Impression(s) / ED Diagnoses Final diagnoses:  Elevated LFTs    Rx / DC Orders ED Discharge Orders    None       Antonio [name] 03/29/20 2355    Antonio Ferguson, MD 03/30/20 2303

## 2020-03-30 ENCOUNTER — Inpatient Hospital Stay (HOSPITAL_COMMUNITY): Payer: Medicare Other

## 2020-03-30 ENCOUNTER — Other Ambulatory Visit: Payer: Self-pay

## 2020-03-30 ENCOUNTER — Encounter (HOSPITAL_COMMUNITY): Payer: Self-pay | Admitting: Internal Medicine

## 2020-03-30 ENCOUNTER — Ambulatory Visit: Payer: Medicare Other | Admitting: Endocrinology

## 2020-03-30 ENCOUNTER — Observation Stay (HOSPITAL_COMMUNITY): Payer: Medicare Other

## 2020-03-30 DIAGNOSIS — K819 Cholecystitis, unspecified: Secondary | ICD-10-CM | POA: Diagnosis present

## 2020-03-30 DIAGNOSIS — K7589 Other specified inflammatory liver diseases: Secondary | ICD-10-CM | POA: Diagnosis present

## 2020-03-30 DIAGNOSIS — G8929 Other chronic pain: Secondary | ICD-10-CM | POA: Diagnosis present

## 2020-03-30 DIAGNOSIS — Z800 Family history of malignant neoplasm of digestive organs: Secondary | ICD-10-CM | POA: Diagnosis not present

## 2020-03-30 DIAGNOSIS — F0390 Unspecified dementia without behavioral disturbance: Secondary | ICD-10-CM | POA: Diagnosis present

## 2020-03-30 DIAGNOSIS — Z85820 Personal history of malignant melanoma of skin: Secondary | ICD-10-CM | POA: Diagnosis not present

## 2020-03-30 DIAGNOSIS — Z96612 Presence of left artificial shoulder joint: Secondary | ICD-10-CM | POA: Diagnosis present

## 2020-03-30 DIAGNOSIS — I129 Hypertensive chronic kidney disease with stage 1 through stage 4 chronic kidney disease, or unspecified chronic kidney disease: Secondary | ICD-10-CM | POA: Diagnosis present

## 2020-03-30 DIAGNOSIS — Z66 Do not resuscitate: Secondary | ICD-10-CM

## 2020-03-30 DIAGNOSIS — N99841 Postprocedural hematoma of a genitourinary system organ or structure following other procedure: Secondary | ICD-10-CM | POA: Diagnosis present

## 2020-03-30 DIAGNOSIS — G4733 Obstructive sleep apnea (adult) (pediatric): Secondary | ICD-10-CM | POA: Diagnosis present

## 2020-03-30 DIAGNOSIS — R112 Nausea with vomiting, unspecified: Secondary | ICD-10-CM | POA: Insufficient documentation

## 2020-03-30 DIAGNOSIS — K8042 Calculus of bile duct with acute cholecystitis without obstruction: Secondary | ICD-10-CM | POA: Diagnosis present

## 2020-03-30 DIAGNOSIS — K219 Gastro-esophageal reflux disease without esophagitis: Secondary | ICD-10-CM | POA: Diagnosis present

## 2020-03-30 DIAGNOSIS — Z8249 Family history of ischemic heart disease and other diseases of the circulatory system: Secondary | ICD-10-CM | POA: Diagnosis not present

## 2020-03-30 DIAGNOSIS — Z825 Family history of asthma and other chronic lower respiratory diseases: Secondary | ICD-10-CM | POA: Diagnosis not present

## 2020-03-30 DIAGNOSIS — N1831 Chronic kidney disease, stage 3a: Secondary | ICD-10-CM | POA: Diagnosis present

## 2020-03-30 DIAGNOSIS — E785 Hyperlipidemia, unspecified: Secondary | ICD-10-CM | POA: Diagnosis present

## 2020-03-30 DIAGNOSIS — N400 Enlarged prostate without lower urinary tract symptoms: Secondary | ICD-10-CM | POA: Diagnosis present

## 2020-03-30 DIAGNOSIS — Z20822 Contact with and (suspected) exposure to covid-19: Secondary | ICD-10-CM | POA: Diagnosis present

## 2020-03-30 DIAGNOSIS — Y838 Other surgical procedures as the cause of abnormal reaction of the patient, or of later complication, without mention of misadventure at the time of the procedure: Secondary | ICD-10-CM | POA: Diagnosis present

## 2020-03-30 DIAGNOSIS — E1122 Type 2 diabetes mellitus with diabetic chronic kidney disease: Secondary | ICD-10-CM | POA: Diagnosis present

## 2020-03-30 DIAGNOSIS — Z833 Family history of diabetes mellitus: Secondary | ICD-10-CM | POA: Diagnosis not present

## 2020-03-30 DIAGNOSIS — K8067 Calculus of gallbladder and bile duct with acute and chronic cholecystitis with obstruction: Secondary | ICD-10-CM | POA: Diagnosis present

## 2020-03-30 DIAGNOSIS — E876 Hypokalemia: Secondary | ICD-10-CM | POA: Insufficient documentation

## 2020-03-30 HISTORY — DX: Do not resuscitate: Z66

## 2020-03-30 LAB — HEPATIC FUNCTION PANEL
ALT: 269 U/L — ABNORMAL HIGH (ref 0–44)
AST: 276 U/L — ABNORMAL HIGH (ref 15–41)
Albumin: 3.2 g/dL — ABNORMAL LOW (ref 3.5–5.0)
Alkaline Phosphatase: 408 U/L — ABNORMAL HIGH (ref 38–126)
Bilirubin, Direct: 3.1 mg/dL — ABNORMAL HIGH (ref 0.0–0.2)
Indirect Bilirubin: 1.2 mg/dL — ABNORMAL HIGH (ref 0.3–0.9)
Total Bilirubin: 4.3 mg/dL — ABNORMAL HIGH (ref 0.3–1.2)
Total Protein: 6 g/dL — ABNORMAL LOW (ref 6.5–8.1)

## 2020-03-30 LAB — GLUCOSE, CAPILLARY
Glucose-Capillary: 117 mg/dL — ABNORMAL HIGH (ref 70–99)
Glucose-Capillary: 114 mg/dL — ABNORMAL HIGH (ref 70–99)
Glucose-Capillary: 102 mg/dL — ABNORMAL HIGH (ref 70–99)

## 2020-03-30 LAB — CBC
HCT: 31.1 % — ABNORMAL LOW (ref 39.0–52.0)
Hemoglobin: 10 g/dL — ABNORMAL LOW (ref 13.0–17.0)
MCH: 30.5 pg (ref 26.0–34.0)
MCHC: 32.2 g/dL (ref 30.0–36.0)
MCV: 94.8 fL (ref 80.0–100.0)
Platelets: 344 10*3/uL (ref 150–400)
RBC: 3.28 MIL/uL — ABNORMAL LOW (ref 4.22–5.81)
RDW: 13.3 % (ref 11.5–15.5)
WBC: 14.7 10*3/uL — ABNORMAL HIGH (ref 4.0–10.5)
nRBC: 0 % (ref 0.0–0.2)

## 2020-03-30 LAB — BASIC METABOLIC PANEL
Anion gap: 9 (ref 5–15)
BUN: 24 mg/dL — ABNORMAL HIGH (ref 8–23)
CO2: 24 mmol/L (ref 22–32)
Calcium: 8 mg/dL — ABNORMAL LOW (ref 8.9–10.3)
Chloride: 104 mmol/L (ref 98–111)
Creatinine, Ser: 1.03 mg/dL (ref 0.61–1.24)
GFR, Estimated: >60 mL/min (ref >60)
Glucose, Bld: 149 mg/dL — ABNORMAL HIGH (ref 70–99)
Potassium: 3.3 mmol/L — ABNORMAL LOW (ref 3.5–5.1)
Sodium: 137 mmol/L (ref 135–145)

## 2020-03-30 LAB — MAGNESIUM: Magnesium: 2 mg/dL (ref 1.7–2.4)

## 2020-03-30 LAB — RESP PANEL BY RT-PCR (FLU A&B, COVID) ARPGX2
Influenza A by PCR: NEGATIVE
Influenza B by PCR: NEGATIVE
SARS Coronavirus 2 by RT PCR: NEGATIVE

## 2020-03-30 LAB — PROTIME-INR
INR: 1.2 (ref 0.8–1.2)
Prothrombin Time: 15 seconds (ref 11.4–15.2)

## 2020-03-30 LAB — PHOSPHORUS: Phosphorus: 3.4 mg/dL (ref 2.5–4.6)

## 2020-03-30 MED ORDER — POTASSIUM CHLORIDE CRYS ER 20 MEQ PO TBCR
20.0000 meq | EXTENDED_RELEASE_TABLET | Freq: Every day | ORAL | Status: DC
  Administered 2020-04-01: 20 meq via ORAL
  Filled 2020-03-30: qty 1

## 2020-03-30 MED ORDER — ONDANSETRON HCL 4 MG/2ML IJ SOLN: 4.0000 mg | Freq: Four times a day (QID) | INTRAMUSCULAR | Status: DC | PRN

## 2020-03-30 MED ORDER — TAMSULOSIN HCL 0.4 MG PO CAPS
0.4000 mg | ORAL_CAPSULE | Freq: Every day | ORAL | Status: DC
  Administered 2020-04-01: 0.4 mg via ORAL
  Filled 2020-03-30: qty 1

## 2020-03-30 MED ORDER — TECHNETIUM TC 99M MEBROFENIN IV KIT
7.3000 | PACK | Freq: Once | INTRAVENOUS | Status: AC
  Administered 2020-03-30: 7.3 via INTRAVENOUS

## 2020-03-30 MED ORDER — POTASSIUM CHLORIDE 10 MEQ/100ML IV SOLN
INTRAVENOUS | Status: AC
  Administered 2020-03-30: 10 meq via INTRAVENOUS
  Filled 2020-03-30: qty 100

## 2020-03-30 MED ORDER — HYDRALAZINE HCL 20 MG/ML IJ SOLN: 10.0000 mg | INTRAMUSCULAR | Status: DC | PRN

## 2020-03-30 MED ORDER — ACETAMINOPHEN 650 MG RE SUPP: 650.0000 mg | Freq: Four times a day (QID) | RECTAL | Status: DC | PRN

## 2020-03-30 MED ORDER — ONDANSETRON HCL 4 MG PO TABS: 4.0000 mg | ORAL_TABLET | Freq: Four times a day (QID) | ORAL | Status: DC | PRN

## 2020-03-30 MED ORDER — METRONIDAZOLE IN NACL 5-0.79 MG/ML-% IV SOLN
500.0000 mg | Freq: Three times a day (TID) | INTRAVENOUS | Status: DC
  Administered 2020-03-30 – 2020-04-01 (×6): 500 mg via INTRAVENOUS
  Filled 2020-03-30 (×6): qty 100

## 2020-03-30 MED ORDER — INSULIN ASPART 100 UNIT/ML ~~LOC~~ SOLN
0.0000 [IU] | Freq: Three times a day (TID) | SUBCUTANEOUS | Status: DC
  Administered 2020-04-01: 2 [IU] via SUBCUTANEOUS

## 2020-03-30 MED ORDER — GADOBUTROL 1 MMOL/ML IV SOLN
6.0000 mL | Freq: Once | INTRAVENOUS | Status: AC | PRN
  Administered 2020-03-30: 6 mL via INTRAVENOUS

## 2020-03-30 MED ORDER — ACETAMINOPHEN 325 MG PO TABS
650.0000 mg | ORAL_TABLET | Freq: Four times a day (QID) | ORAL | Status: DC | PRN
  Administered 2020-03-30: 650 mg via ORAL
  Filled 2020-03-30: qty 2

## 2020-03-30 MED ORDER — POTASSIUM CHLORIDE IN NACL 20-0.9 MEQ/L-% IV SOLN
INTRAVENOUS | Status: DC
  Filled 2020-03-30 (×4): qty 1000

## 2020-03-30 MED ORDER — FAMOTIDINE IN NACL 20-0.9 MG/50ML-% IV SOLN
20.0000 mg | Freq: Two times a day (BID) | INTRAVENOUS | Status: DC
  Administered 2020-03-30 – 2020-03-31 (×3): 20 mg via INTRAVENOUS
  Filled 2020-03-30 (×3): qty 50

## 2020-03-30 MED ORDER — POTASSIUM CHLORIDE 10 MEQ/100ML IV SOLN
10.0000 meq | INTRAVENOUS | Status: AC
  Administered 2020-03-30 (×2): 10 meq via INTRAVENOUS
  Filled 2020-03-30 (×2): qty 100

## 2020-03-30 MED ORDER — INSULIN ASPART 100 UNIT/ML ~~LOC~~ SOLN: 0.0000 [IU] | Freq: Every day | SUBCUTANEOUS | Status: DC

## 2020-03-30 MED ORDER — HYDROMORPHONE HCL 1 MG/ML IJ SOLN
0.5000 mg | INTRAMUSCULAR | Status: DC | PRN
  Filled 2020-03-30: qty 0.5

## 2020-03-30 MED ORDER — LAMOTRIGINE 25 MG PO TABS
25.0000 mg | ORAL_TABLET | Freq: Every day | ORAL | Status: DC
  Administered 2020-04-01: 25 mg via ORAL
  Filled 2020-03-30: qty 1

## 2020-03-30 MED ORDER — SODIUM CHLORIDE 0.9 % IV SOLN
2.0000 g | Freq: Once | INTRAVENOUS | Status: AC
  Administered 2020-03-30: 2 g via INTRAVENOUS
  Filled 2020-03-30: qty 20

## 2020-03-30 MED ORDER — INFLUENZA VAC A&B SA ADJ QUAD 0.5 ML IM PRSY
0.5000 mL | PREFILLED_SYRINGE | INTRAMUSCULAR | Status: DC
  Filled 2020-03-30: qty 0.5

## 2020-03-30 MED ORDER — SODIUM CHLORIDE 0.9 % IV SOLN
2.0000 g | INTRAVENOUS | Status: DC
  Administered 2020-03-30 – 2020-03-31 (×2): 2 g via INTRAVENOUS
  Filled 2020-03-30 (×2): qty 2

## 2020-03-30 NOTE — H&P (View-Only) (Signed)
CC:  Epigastric pain, nausea, and vomiting  Subjective: Right now his biggest pain is from his hernia repair, his stomach pain resolved when he stopped eating.  He is waiting for and MRCP, and we discussed ERCP.  Objective: Vital signs in last 24 hours: Temp:  [97.3 F (36.3 C)-98 F (36.7 C)] 97.3 F (36.3 C) (03/08 1236) Pulse Rate:  [81-112] 87 (03/08 1236) Resp:  [14-27] 16 (03/08 1236) BP: (106-152)/(49-88) 135/63 (03/08 1236) SpO2:  [93 %-100 %] 99 % (03/08 1236) Weight:  [62.7 kg-65.8 kg] 62.7 kg (03/08 0422) Last BM Date: 03/29/20 600 IV Urine 400 Afebrile, VSS WBC 12.6>>14.7 K+ 3.3 CMP Latest Ref Rng & Units 03/30/2020 03/29/2020 03/03/2020  Calcium 8.9 - 10.3 mg/dL 8.0(L) 8.7(L) 9.0  Total Protein 6.5 - 8.1 g/dL 6.0(L) 7.0 -  Total Bilirubin 0.3 - 1.2 mg/dL 4.3(H) 3.1(H) -  Alkaline Phos 38 - 126 U/L 408(H) 454(H) -  AST 15 - 41 U/L 276(H) 683(H) -  ALT 0 - 44 U/L 269(H) 300(H) -  COVID is negative RUQ [place]: multiple stones up to 12 mm, CBD 2-3 mm, GB wall thickening CT: post surgical hematoma R inguinal canal/R scrotal hernia;  Bilateral nonobstructing renal calculi, with significant bilateral renal cortical scarring and atrophy. Sigmoid diverticulosis without diverticulitis HIDA:  No bile excretion into CBD or Small bowel - ? Hepatitis vs bile duct obstruction, multiple gallstone  Intake/Output from previous day: 03/07 0701 - 03/08 0700 In: 600 [IV Piggyback:600] Out: 400 [Urine:400] Intake/Output this shift: No intake/output data recorded.  General appearance: alert, cooperative and no distress Resp: clear to auscultation bilaterally GI: soft, non tender, + BS, port site looks fine Male genitalia: he has a large hematoma right side going into his scrotum, say testicles are very swollen also.   Lab Results:  Recent Labs    03/29/20 1740 03/30/20 0555  WBC 12.6* 14.7*  HGB 11.5* 10.0*  HCT 35.2* 31.1*  PLT 383 344    BMET Recent Labs     03/29/20 1740 03/30/20 0555  NA 138 137  K 3.0* 3.3*  CL 101 104  CO2 23 24  GLUCOSE 207* 149*  BUN 27* 24*  CREATININE 1.18 1.03  CALCIUM 8.7* 8.0*   PT/INR Recent Labs    03/30/20 0555  LABPROT 15.0  INR 1.2    Recent Labs  Lab 03/29/20 1740 03/30/20 0555  AST 683* 276*  ALT 300* 269*  ALKPHOS 454* 408*  BILITOT 3.1* 4.3*  PROT 7.0 6.0*  ALBUMIN 3.7 3.2*     Lipase     Component Value Date/Time   LIPASE 191 (H) 03/29/2020 1740     Medications: . insulin aspart  0-15 Units Subcutaneous TID WC  . insulin aspart  0-5 Units Subcutaneous QHS  . lamoTRIgine  25 mg Oral Daily  . potassium chloride SA  20 mEq Oral Daily  . tamsulosin  0.4 mg Oral QPC breakfast   . 0.9 % NaCl with KCl 20 mEq / L 100 mL/hr at 03/30/20 0821  . cefTRIAXone (ROCEPHIN)  IV    . famotidine (PEPCID) IV 20 mg (03/30/20 1236)  . metronidazole 500 mg (03/30/20 1318)    Assessment/Plan Chronic back pain Diabetes Chronic kidney disease Memory loss/Dementia OSA on CPAP Hyperbilirubinemia. BPH DNR  Abdominal pain, nausea, and vomiting Cholecystitis with obstruction/?choledocholithiasis   - Hepatitis vs Choledocholithiasis Bilateral inguinal hernia repair with RIH hematoma  FEN:NPO/IV fluids ID:  Rocephin/Flagyl 3/8 >> day 1 DVT: SCD's Follow up:  TBD   Plan:  Keep NPO, continue antibiotics, MRCP pending. Jock strap, and kpad     LOS: 0 days    JENNINGS,WILLARD 03/30/2020 Please see Amion
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
## 2020-03-30 NOTE — ED Provider Notes (Signed)
12:35 AM Care assumed at shift change from [place], [place].  In short, patient presenting for intermittent, waxing and waning epigastric pain.  Had an episode acutely at 3 AM which recurred at 10 AM after eating [employer].  Noted to have significant elevation of his LFTs.  Bilirubin 3.1.  There is mild lipase elevation to 191.  Ultrasound pending at change of shift.  This has resulted and shows moderate gallbladder wall thickening with multiple gallstones and layering sludge.  No pericholecystic fluid.  Sonographic Percell [name] sign is negative.  While imaging suggests chronic cholecystitis, history and laboratory evaluation with leukocytosis concerning for possible acute on chronic process.  General surgery paged to discuss case.  Anticipate admission.  1:09 AM Repeat page placed to general surgery.  1:26 AM Patient updated on plan for admission. Presently pain free, appears comfortable.   1:34 AM Repeat page placed to general surgery.  2:10 AM  Case discussed with Dr. Olevia Bowens of [facility] who will admit. Dr. Marcello Moores of general surgery also verbally made aware of patient's need for consultation this AM.   Antonietta Breach, PA-C 43/32/95 [id number]    Delora Fuel, MD 16/60/63 [phone]

## 2020-03-30 NOTE — H&P (Signed)
History and Physical    Antonio [name] IHK:[id number] DOB: 1940/10/08 DOA: 03/29/2020  PCP: Leeroy Cha, MD Consultants:  Johney Maine - surgery; Loanne Drilling - endocrinology; Gwenlyn Found - cardiology; Tat - neurology Patient coming from:  Home - lives with wife and 41(!) dogs; NOK: Wife, [place], [phone]  Chief Complaint: Abdominal pain  HPI: Antonio [name] is a 80 y.o. male with medical history significant of OSA on CPAP; HTN; HLD; DM; BPH; and chronic back pain presenting with abdominal pain.  He had recent (03/11/20) hernia repair.  He reports that he developed real bad pain right at the xiphoid region.  It went away and he ate something and it came back double.  It happened at 0300 yesterday AM after having eaten a good supper.  He got hungry and was going to see the doctor for hernia post-op; they stopped at [facility] and it came back again.  + n/v - he tried to make himself vomiting to get rid of the pain.  He is not having pain now - but hasn't had anything further to eat.  He does still have a gallbladder.    ED Course: Abdominal pain with recent hernia surgery.  RUQ indicates possible obstructive cholecystitis.  Needs HIDA.  Review of Systems: As per HPI; otherwise review of systems reviewed and negative.   Ambulatory Status:  Ambulates without assistance  COVID Vaccine Status:  Complete  Past Medical History:  Diagnosis Date  . Anemia    hx  . BPH (benign prostatic hyperplasia)   . 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 Chronic back pain   . Depression   . Diabetes mellitus without complication (HCC)    not on meds   . DNR (do not resuscitate) 03/30/2020  . GERD (gastroesophageal reflux disease)   . Heart murmur   . History of carpal tunnel syndrome    Bilateral  . Hyperlipidemia    "borderline"  . Hypertension   . Leg edema, right   . OSA on CPAP   . RBBB 01/11/2016   Noted on EKG    Past Surgical History:   Procedure Laterality Date  . CARPAL TUNNEL RELEASE Bilateral   . INGUINAL HERNIA REPAIR N/A 03/11/2020   Procedure: LAPAROSCOPIC BILATERAL INGUINAL HERNIA REPAIR WITH MESH, TAP BLOCK;  Surgeon: Michael Boston, MD;  Location: [facility];  Service: General;  Laterality: N/A;  . LUMBAR LAMINECTOMY/DECOMPRESSION MICRODISCECTOMY  09/06/2011   Procedure: LUMBAR LAMINECTOMY/DECOMPRESSION MICRODISCECTOMY 1 LEVEL;  Surgeon: Hosie Spangle, MD;  Location: [facility];  Service: Neurosurgery;  Laterality: Left;  Left lumbar three-four Lumbar Laminotomy/microdiskectomy  . LUMBAR LAMINECTOMY/DECOMPRESSION MICRODISCECTOMY Left 12/26/2012   Procedure: LUMBAR LAMINECTOMY/DECOMPRESSION MICRODISCECTOMY LEFT LUMBAR THREE-FOUR ;  Surgeon: Hosie Spangle, MD;  Location: [place] [facility]UR[facility] OR[facility];  Service: Neurosurgery;  Laterality: Left;  . POLYPECTOMY     removed small intestine  . REVERSE SHOULDER ARTHROPLASTY Left 07/11/2019   Procedure: REVERSE SHOULDER ARTHROPLASTY;  Surgeon: Netta Cedars, MD;  Location: [facility];  Service: Orthopedics;  Laterality: Left;  interscalene block  . ROTATOR CUFF REPAIR Left   . TONSILLECTOMY      Social History   Socioeconomic History  . Marital status: Married    Spouse name: Not on file  . Number of children: 3  . Years of education: Not on file  . Highest education level: Not on file  Occupational History  . Occupation: retired    Comment: Paediatric nurse  Tobacco Use  . Smoking status: Never Smoker  . Smokeless tobacco: Never Used  Vaping Use  . Vaping Use: Never used  Substance and Sexual Activity  . Alcohol use: Not Currently    Comment: quit 40 years ago  . Drug use: No  . Sexual activity: Not on file  Other Topics Concern  . Not on file  Social History Narrative   Lives with wife in a tri-level home.  Has 3 children.  Retired from [employer].  Education: high school.   They help raise Yorkie Warden/ranger   Social Determinants of Health   Financial  Resource Strain: Not on file  Food Insecurity: Not on file  Transportation Needs: Not on file  Physical Activity: Not on file  Stress: Not on file  Social Connections: Not on file  Intimate Partner Violence: Not on file    Allergies  Allergen Reactions  . Metformin And Related Diarrhea  . Wellbutrin [Bupropion] Other (See Comments)    Mental fogginess per patient  . Zoloft [Sertraline] Other (See Comments)    Mental fogginess per patient    Family History  Problem Relation Age of Onset  . Colon cancer Mother   . COPD Father   . Diabetes Mellitus I Brother   . Hypertension Daughter     Prior to Admission medications   Medication Sig Start Date End Date Taking? Authorizing Provider  cholecalciferol (VITAMIN D) 1000 units tablet Take 1,000 Units by mouth in the morning and at bedtime.    Yes [provider]  ELDERBERRY PO Take 2 each by mouth daily. Gummies   Yes [provider]  empagliflozin (JARDIANCE) 25 MG TABS tablet Take 1 tablet (25 mg total) by mouth daily before breakfast. 02/10/20  Yes Renato Shin, MD  famotidine (PEPCID) 10 MG tablet Take 10 mg by mouth daily.    Yes [provider]  ferrous sulfate 325 (65 FE) MG tablet Take 325 mg by mouth daily with breakfast.   Yes [provider]  hydrochlorothiazide (HYDRODIURIL) 25 MG tablet Take 25 mg by mouth daily.   Yes [provider]  lamoTRIgine (LAMICTAL) 25 MG tablet TAKE 1 TABLET BY MOUTH  DAILY Patient taking differently: Take 25 mg by mouth daily. 03/16/20  Yes [name] T, PA-C  potassium chloride SA (K-DUR,KLOR-CON) 20 MEQ tablet Take 20 mEq by mouth daily.  07/30/17  Yes [provider]  Tamsulosin HCl (FLOMAX) 0.4 MG CAPS Take 1 capsule (0.4 mg total) by mouth daily after breakfast. 09/08/11  Yes Kary Kos, MD  traMADol (ULTRAM) 50 MG tablet Take 1-2 tablets (50-100 mg total) by mouth every 6 (six) hours as needed for moderate pain or severe pain. 03/11/20   Liston Alba, MD    Physical Exam: Vitals:   03/30/20 0400 03/30/20 0422 03/30/20 1124 03/30/20 1236  BP: (!) 111/51 (!) 130/58 (!) 144/70 135/63  Pulse: 99 97 89 87  Resp: 19 16 16 16   Temp:  98 F (36.7 C)  (!) 97.3 F (36.3 C)  TempSrc:  Oral  Oral  SpO2: 94% 98%  99%  Weight:  62.7 kg    Height:         . General:  Appears calm and comfortable and is in NAD . Eyes:  PERRL, EOMI, normal lids, iris . ENT:  grossly normal hearing, lips & tongue, mmm . Neck:  no LAD, masses or thyromegaly . Cardiovascular:  RRR, no m/r/g. No LE edema.  Marland Kitchen Respiratory:  CTA bilaterally with no wheezes/rales/rhonchi.  Normal respiratory effort. . Abdomen:  soft, midepigastric TTP, ND, hypoactive but present BS; tiny umbilical and B LQ surgical scars appear to be healing well . Skin:  no rash or induration seen on limited exam . Musculoskeletal:  grossly normal tone BUE/BLE, good ROM, no bony abnormality . Lower extremity:  No LE edema.  Limited foot exam with no ulcerations.  2+ distal pulses. Marland Kitchen Psychiatric:  grossly normal mood and affect, speech fluent and appropriate, AOx3 . Neurologic:  CN 2-12 grossly intact, moves all extremities in coordinated fashion    Radiological Exams on Admission: Independently reviewed - see discussion in A/P where applicable  NM Hepatobiliary Liver Func  Result Date: 03/30/2020 CLINICAL DATA:  Abdominal pain. RIGHT upper quadrant pain with vomiting. Gallstones on ultrasound imaging. Elevated bilirubin. EXAM: NUCLEAR MEDICINE HEPATOBILIARY IMAGING TECHNIQUE: Sequential images of the abdomen were obtained out to 60 minutes following intravenous administration of radiopharmaceutical. RADIOPHARMACEUTICALS:  7.3 mCi Tc-72m  Choletec IV COMPARISON:  CT 03/29/2020, ultrasound 03/30/2020 FINDINGS: Prompt clearance radiotracer from blood pool and homogeneous uptake liver. Imaging performed for 90 minutes. There is no evidence transit radiotracer bile into the common  bile duct or intestine. The gallbladder does not fill. IMPRESSION: 1. No excretion radiotracer bile into the common bile duct or proximal small bowel. Findings are indicative of hepatitis versus bile duct obstruction. In patient with multiple gallstones and elevated bilirubin and acute abdominal pain, favor common bile duct obstruction secondary to choledocholithiasis 2. Patency of the cystic duct cannot be confirmed. Non filling of the gallbladder could indicate cholecystitis versus non filling of the gallbladder related to hydrostatic pressure from common bile duct obstruction. These results will be called to the ordering clinician or representative by the Radiologist Assistant, and communication documented in the PACS or [employer]. Electronically Signed   By: Suzy Bouchard M.D.   On: 03/30/2020 12:43   CT ABDOMEN PELVIS W CONTRAST  Addendum Date: 03/30/2020   ADDENDUM REPORT: 03/30/2020 09:34 ADDENDUM: After conferring with referring physician Dr. Johney Maine, the large intermediate density collection within the RIGHT inguinal canal is favored to represent a postsurgical hematoma related to reduction and repair of large RIGHT scrotal hernia. Patient was seen 1 day prior in clinic with no significant symptoms. Findings discussed toDr.  Johney Maine on 03/30/2020  at09:00. Electronically Signed   By: Suzy Bouchard M.D.   On: 03/30/2020 09:34   Result Date: 03/30/2020 CLINICAL DATA:  Epigastric pain since 3 a.m., vomiting EXAM: CT ABDOMEN AND PELVIS WITH CONTRAST TECHNIQUE: Multidetector CT imaging of the abdomen and pelvis was performed using the standard protocol following bolus administration of intravenous contrast. CONTRAST:  179mL OMNIPAQUE IOHEXOL 300 MG/ML  SOLN COMPARISON:  At 2613 FINDINGS: Lower chest: No acute pleural or parenchymal lung disease. Hepatobiliary: No focal liver abnormality is seen. No gallstones, gallbladder wall thickening, or biliary dilatation. Pancreas: Unremarkable. No pancreatic  ductal dilatation or surrounding inflammatory changes. Spleen: Normal in size without focal abnormality. Adrenals/Urinary Tract: Numerous nonobstructing bilateral renal calculi are identified, measuring up to 5 mm on the right and 4 mm on the left. No evidence of obstructive uropathy within either kidney. Progressive areas of bilateral renal cortical scarring and thinning. The adrenals and bladder are unremarkable. Stomach/Bowel: No bowel obstruction or ileus. Diverticulosis of the distal colon with no evidence of acute diverticulitis. Normal appendix right lower quadrant. Postsurgical changes from previous small bowel resection. Vascular/Lymphatic: Aortic atherosclerosis. No enlarged abdominal or pelvic lymph nodes. Reproductive: Prostate is  mildly enlarged measuring 4.8 x 4.2 cm. Other: There is a complex fluid collection within the right inguinal canal extending into the right hemiscrotum, measuring approximately 8.6 x 7.4 x 10.6 cm in size. Overall this likely reflects a right inguinal hernia containing mesenteric fat and fluid, with concerns for an incarcerated hernia. No bowel herniation is identified. No free intraperitoneal fluid or free gas. No other abdominal wall hernia. Musculoskeletal: Chronic appearing L1 compression fracture. No other acute bony abnormalities. Reconstructed images demonstrate no additional findings. IMPRESSION: 1. Large incarcerated fat containing right inguinal hernia, with significant fluid in the hernia sac. No bowel herniation. 2. Bilateral nonobstructing renal calculi, with significant bilateral renal cortical scarring and atrophy. 3. Sigmoid diverticulosis without diverticulitis. 4.  Aortic Atherosclerosis (ICD10-I70.0). Electronically Signed: By: Randa Ngo M.D. On: 03/29/2020 23:05   US Abdomen Limited RUQ (LIVER/GB)  Result Date: 03/30/2020 CLINICAL DATA:  Elevated liver function tests EXAM: ULTRASOUND ABDOMEN LIMITED RIGHT UPPER QUADRANT COMPARISON:  None. FINDINGS:  Gallbladder: The gallbladder contains multiple stones measuring up to 12 mm in greatest diameter as well as layering sludge and there is moderate gallbladder wall thickening noted. The gallbladder, however, is not distended, there is no pericholecystic fluid identified, and the sonographic Percell [name] sign is reportedly negative. Common bile duct: Diameter: 2-3 mm Liver: No focal lesion identified. Within normal limits in parenchymal echogenicity. Portal vein is patent on color Doppler imaging with normal direction of blood flow towards the liver. Other: No ascites IMPRESSION: Cholelithiasis with gallbladder wall thickening suggesting changes of chronic cholecystitis in the appropriate clinical setting. Electronically Signed   By: Fidela Salisbury MD   On: 03/30/2020 00:25    EKG: not done   Labs on Admission: I have personally reviewed the available labs and imaging studies at the time of the admission.  Pertinent labs:   K+ 3.3 Glucose 149 AP 408 Albumin 3.2 AST 376/ALT 269 (683/300 last night); normal on 08/15/19 Bili 3.1 WBC 14.7 Hgb 10.0 INR 1.2 COVID/flu negative UA unremarkable   Assessment/Plan Principal Problem:   Cholestatic hepatitis Active Problems:   Hyperlipidemia   Diabetes (HCC)   Chronic kidney disease, stage 3a (HCC)   Memory loss   Obstructive sleep apnea syndrome   Scrotal hernia - right, s/p lap repair w mesh 03/11/2020   Inguinal hernia, left, s/p lap repair w mesh 03/11/2020   Hyperbilirubinemia   DNR (do not resuscitate)    Acute cholecystitis with ductal obstruction resulting in hepatitis -Patient with recent hernia repair presenting with acute onset of epigastric pain with nausea -CT with probable postsurgical hematoma that is thought to be unrelated to presenting complaint; also with incidental nonobstructing B renal calculi and sigmoid diverticulosis -RUQ [place] with cholelithiasis with GB wall thickening -HIDA scan performed and showed bile duct obstruction  from choledocholithiasis -LFT elevation is thought to be related to this issue  -Needs cholecystectomy but possibly also ERCP first -Surgery already consulted and has been notified of HIDA scan result -GI has been consulted for ERCP as well; Dr. Silverio Decamp has requested MRCP first and this is ordered -NPO for now -Dilaudid for pain, Zofran for nausea -Empiric coverage with Rocephin/Flagyl for now  Recent hernia repair -Appears to be healing well - patient without complaint -Apparently post-operative hematoma appreciated on CT -No intervention appears to be indicated at this time  OSA on CPAP -Continue CPAP  HTN -Hold HCTZ -Will cover with prn IV hydralazine  HLD -He does not appear to be taking medications for this issue at  this time  DM -Will check A1c -hold Jardiance -Cover with moderate-scale SSI  BPH -Continue tamsulosin  Stage 3a CKD -Stable -Repeat BMP in AM  Dementia -Continue Lamictal  DNR -I have discussed code status with the patient and he would not desire resuscitation and would prefer to die a natural death should that situation arise. -He will need a gold out of facility DNR form at the time of discharge    Note: This patient has been tested and is negative for the novel coronavirus COVID-19. The patient has been fully vaccinated against COVID-19.   Level of care: Med-Surg DVT prophylaxis: SCDs Code Status:  DNR - confirmed with patient Family Communication: None present; I spoke with the patient's wife briefly by telephone at the time of admission. Disposition Plan:  The patient is from: home  Anticipated d/c is to: home without [facility] services   Anticipated d/c date will depend on clinical response to treatment, but likely 2-3 days  Patient is currently: acutely ill Consults called: Surgery; GI  Admission status:  Admit - It is my clinical opinion that admission to INPATIENT is reasonable and necessary because of the expectation that this patient  will require hospital care that crosses at least 2 midnights to treat this condition based on the medical complexity of the problems presented.  Given the aforementioned information, the predictability of an adverse outcome is felt to be significant.    Karmen Bongo MD [facility]   How to contact the [facility] Attending or Consulting provider [place] or covering provider during after hours [place], for this patient?  1. Check the care team in [facility] and look for a) attending/consulting [facility] provider listed and b) the [facility] team listed 2. Log into [email or web address] and use [place]'s universal password to access. If you do not have the password, please contact the hospital operator. 3. Locate the [facility] provider you are looking for under [facility] and page to a number that you can be directly reached. 4. If you still have difficulty reaching the provider, please page the [facility] (Director on Call) for the Hospitalists listed on amion for assistance.   03/30/2020, 1:24 PM

## 2020-03-30 NOTE — Progress Notes (Signed)
CC:  Epigastric pain, nausea, and vomiting  Subjective: Right now his biggest pain is from his hernia repair, his stomach pain resolved when he stopped eating.  He is waiting for and MRCP, and we discussed ERCP.  Objective: Vital signs in last 24 hours: Temp:  [97.3 F (36.3 C)-98 F (36.7 C)] 97.3 F (36.3 C) (03/08 1236) Pulse Rate:  [81-112] 87 (03/08 1236) Resp:  [14-27] 16 (03/08 1236) BP: (106-152)/(49-88) 135/63 (03/08 1236) SpO2:  [93 %-100 %] 99 % (03/08 1236) Weight:  [62.7 kg-65.8 kg] 62.7 kg (03/08 0422) Last BM Date: 03/29/20 600 IV Urine 400 Afebrile, VSS WBC 12.6>>14.7 K+ 3.3 CMP Latest Ref Rng & Units 03/30/2020 03/29/2020 03/03/2020  Calcium 8.9 - 10.3 mg/dL 8.0(L) 8.7(L) 9.0  Total Protein 6.5 - 8.1 g/dL 6.0(L) 7.0 -  Total Bilirubin 0.3 - 1.2 mg/dL 4.3(H) 3.1(H) -  Alkaline Phos 38 - 126 U/L 408(H) 454(H) -  AST 15 - 41 U/L 276(H) 683(H) -  ALT 0 - 44 U/L 269(H) 300(H) -  COVID is negative RUQ [place]: multiple stones up to 12 mm, CBD 2-3 mm, GB wall thickening CT: post surgical hematoma R inguinal canal/R scrotal hernia;  Bilateral nonobstructing renal calculi, with significant bilateral renal cortical scarring and atrophy. Sigmoid diverticulosis without diverticulitis HIDA:  No bile excretion into CBD or Small bowel - ? Hepatitis vs bile duct obstruction, multiple gallstone  Intake/Output from previous day: 03/07 0701 - 03/08 0700 In: 600 [IV Piggyback:600] Out: 400 [Urine:400] Intake/Output this shift: No intake/output data recorded.  General appearance: alert, cooperative and no distress Resp: clear to auscultation bilaterally GI: soft, non tender, + BS, port site looks fine Male genitalia: he has a large hematoma right side going into his scrotum, say testicles are very swollen also.   Lab Results:  Recent Labs    03/29/20 1740 03/30/20 0555  WBC 12.6* 14.7*  HGB 11.5* 10.0*  HCT 35.2* 31.1*  PLT 383 344    BMET Recent Labs     03/29/20 1740 03/30/20 0555  NA 138 137  K 3.0* 3.3*  CL 101 104  CO2 23 24  GLUCOSE 207* 149*  BUN 27* 24*  CREATININE 1.18 1.03  CALCIUM 8.7* 8.0*   PT/INR Recent Labs    03/30/20 0555  LABPROT 15.0  INR 1.2    Recent Labs  Lab 03/29/20 1740 03/30/20 0555  AST 683* 276*  ALT 300* 269*  ALKPHOS 454* 408*  BILITOT 3.1* 4.3*  PROT 7.0 6.0*  ALBUMIN 3.7 3.2*     Lipase     Component Value Date/Time   LIPASE 191 (H) 03/29/2020 1740     Medications: . insulin aspart  0-15 Units Subcutaneous TID WC  . insulin aspart  0-5 Units Subcutaneous QHS  . lamoTRIgine  25 mg Oral Daily  . potassium chloride SA  20 mEq Oral Daily  . tamsulosin  0.4 mg Oral QPC breakfast   . 0.9 % NaCl with KCl 20 mEq / L 100 mL/hr at 03/30/20 0821  . cefTRIAXone (ROCEPHIN)  IV    . famotidine (PEPCID) IV 20 mg (03/30/20 1236)  . metronidazole 500 mg (03/30/20 1318)    Assessment/Plan Chronic back pain Diabetes Chronic kidney disease Memory loss/Dementia OSA on CPAP Hyperbilirubinemia. BPH DNR  Abdominal pain, nausea, and vomiting Cholecystitis with obstruction/?choledocholithiasis   - Hepatitis vs Choledocholithiasis Bilateral inguinal hernia repair with RIH hematoma  FEN:NPO/IV fluids ID:  Rocephin/Flagyl 3/8 >> day 1 DVT: SCD's Follow up:  TBD   Plan:  Keep NPO, continue antibiotics, MRCP pending. Jock strap, and kpad     LOS: 0 days    JENNINGS,WILLARD 03/30/2020 Please see Amion

## 2020-03-30 NOTE — Consult Note (Signed)
CC: RUQ pain  Requesting provider: Dr Olevia Bowens  HPI: Antonio [name] is an 80 y.o. male with DM 2, who is here for acute onset RUQ pain starting Mon after eating fast food.  Associated with nausea and vomiting.  S/p bilateral inguinal hernia repair with SG on 2/17.    Past Medical History:  Diagnosis Date  . Anemia    hx  . BPH (benign prostatic hyperplasia)   . 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 Chronic back pain   . Depression   . Diabetes mellitus without complication (HCC)    not on meds   . GERD (gastroesophageal reflux disease)   . Heart murmur   . History of carpal tunnel syndrome    Bilateral  . Hyperlipidemia    "borderline"  . Hypertension   . Leg edema, right   . OSA on CPAP   . 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 CARPAL TUNNEL RELEASE Bilateral   . INGUINAL HERNIA REPAIR N/A 03/11/2020   Procedure: LAPAROSCOPIC BILATERAL INGUINAL HERNIA REPAIR WITH MESH, TAP BLOCK;  Surgeon: Michael Boston, MD;  Location: [facility];  Service: General;  Laterality: N/A;  . LUMBAR LAMINECTOMY/DECOMPRESSION MICRODISCECTOMY  09/06/2011   Procedure: LUMBAR LAMINECTOMY/DECOMPRESSION MICRODISCECTOMY 1 LEVEL;  Surgeon: Hosie Spangle, MD;  Location: [facility];  Service: Neurosurgery;  Laterality: Left;  Left lumbar three-four Lumbar Laminotomy/microdiskectomy  . LUMBAR LAMINECTOMY/DECOMPRESSION MICRODISCECTOMY Left 12/26/2012   Procedure: LUMBAR LAMINECTOMY/DECOMPRESSION MICRODISCECTOMY LEFT LUMBAR THREE-FOUR ;  Surgeon: Hosie Spangle, MD;  Location: [facility];  Service: Neurosurgery;  Laterality: Left;  . POLYPECTOMY     removed small intestine  . REVERSE SHOULDER ARTHROPLASTY Left 07/11/2019   Procedure: REVERSE SHOULDER ARTHROPLASTY;  Surgeon: Netta Cedars, MD;  Location: [facility];  Service: Orthopedics;  Laterality: Left;  interscalene block  . ROTATOR CUFF REPAIR Left   .  TONSILLECTOMY      Family History  Problem Relation Age of Onset  . Colon cancer Mother   . COPD Father   . Diabetes Mellitus I Brother   . Hypertension Daughter     Social:  reports that he has never smoked. He has never used smokeless tobacco. He reports that he does not drink alcohol and does not use drugs.  Allergies:  Allergies  Allergen Reactions  . Metformin And Related Diarrhea  . Wellbutrin [Bupropion] Other (See Comments)    Mental fogginess per patient  . Zoloft [Sertraline] Other (See Comments)    Mental fogginess per patient    Medications: I have reviewed the patient's current medications.  Results for orders placed or performed during the hospital encounter of 03/29/20 (from the past 48 hour(s))  Lipase, blood     Status: Abnormal   Collection Time: 03/29/20  5:40 PM  Result Value Ref Range   Lipase 191 (H) 11 - 51 U/L    Comment: Performed at [facility], [place] [address] [place], [place] [address]  Comprehensive metabolic panel     Status: Abnormal   Collection Time: 03/29/20  5:40 PM  Result Value Ref Range   Sodium 138 135 - 145 mmol/L   Potassium 3.0 (L) 3.5 - 5.1 mmol/L   Chloride 101 98 - 111 mmol/L   CO2 23 22 - 32 mmol/L   Glucose, Bld 207 (H) 70 - 99 mg/dL    Comment: Glucose reference  range applies only to samples taken after fasting for at least 8 hours.   BUN 27 (H) 8 - 23 mg/dL   Creatinine, Ser 1.18 0.61 - 1.24 mg/dL   Calcium 8.7 (L) 8.9 - 10.3 mg/dL   Total Protein 7.0 6.5 - 8.1 g/dL   Albumin 3.7 3.5 - 5.0 g/dL   AST 683 (H) 15 - 41 U/L   ALT 300 (H) 0 - 44 U/L   Alkaline Phosphatase 454 (H) 38 - 126 U/L   Total Bilirubin 3.1 (H) 0.3 - 1.2 mg/dL   GFR, Estimated >60 >60 mL/min    Comment: (NOTE) Calculated using the CKD-EPI Creatinine Equation (2021)    Anion gap 14 5 - 15    Comment: Performed at [facility], [place] [address] [place], [place] [address]  CBC     Status: Abnormal   Collection  Time: 03/29/20  5:40 PM  Result Value Ref Range   WBC 12.6 (H) 4.0 - 10.5 K/uL   RBC 3.77 (L) 4.22 - 5.81 MIL/uL   Hemoglobin 11.5 (L) 13.0 - 17.0 g/dL   HCT 35.2 (L) 39.0 - 52.0 %   MCV 93.4 80.0 - 100.0 fL   MCH 30.5 26.0 - 34.0 pg   MCHC 32.7 30.0 - 36.0 g/dL   RDW 13.0 11.5 - 15.5 %   Platelets 383 150 - 400 K/uL   nRBC 0.0 0.0 - 0.2 %    Comment: Performed at [facility], [place] [address] [place], [place] [address]  Urinalysis, Routine w reflex microscopic Urine, Clean Catch     Status: Abnormal   Collection Time: 03/29/20 11:30 PM  Result Value Ref Range   Color, Urine AMBER (A) YELLOW    Comment: BIOCHEMICALS MAY BE AFFECTED BY COLOR   APPearance CLEAR CLEAR   Specific Gravity, Urine 1.032 (H) 1.005 - 1.030   pH 7.0 5.0 - 8.0   Glucose, UA NEGATIVE NEGATIVE mg/dL   Hgb urine dipstick NEGATIVE NEGATIVE   Bilirubin Urine NEGATIVE NEGATIVE   Ketones, ur NEGATIVE NEGATIVE mg/dL   Protein, ur NEGATIVE NEGATIVE mg/dL   Nitrite NEGATIVE NEGATIVE   Leukocytes,Ua NEGATIVE NEGATIVE    Comment: Performed at [place] [address], [place], [place] [address]  Resp Panel by RT-PCR (Flu A&B, Covid) Nasopharyngeal Swab     Status: None   Collection Time: 03/30/20 12:36 AM   Specimen: Nasopharyngeal Swab; Nasopharyngeal(NP) swabs in vial transport medium  Result Value Ref Range   SARS Coronavirus 2 by RT PCR NEGATIVE NEGATIVE    Comment: (NOTE) SARS-CoV-2 target nucleic acids are NOT DETECTED.  The SARS-CoV-2 RNA is generally detectable in upper respiratory specimens during the acute phase of infection. The lowest concentration of SARS-CoV-2 viral copies this assay can detect is 138 copies/mL. A negative result does not preclude SARS-Cov-2 infection and should not be used as the sole basis for treatment or other patient management decisions. A negative result may occur with  improper specimen collection/handling, submission of specimen  other than nasopharyngeal swab, presence of viral mutation(s) within the areas targeted by this assay, and inadequate number of viral copies(<138 copies/mL). A negative result must be combined with clinical observations, patient history, and epidemiological information. The expected result is Negative.  Fact Sheet for Patients:  EntrepreneurPulse.com.au  Fact Sheet for Healthcare Providers:  IncredibleEmployment.be  This test is no t yet approved or cleared by the [place] FDA and  has been authorized for detection and/or diagnosis  of SARS-CoV-2 by FDA under an Emergency Use Authorization (EUA). This EUA will remain  in effect (meaning this test can be used) for the duration of the COVID-19 declaration under Section 564(b)(1) of the Act, 21 U.S.C.section 360bbb-3(b)(1), unless the authorization is terminated  or revoked sooner.       Influenza A by PCR NEGATIVE NEGATIVE   Influenza B by PCR NEGATIVE NEGATIVE    Comment: (NOTE) The Xpert Xpress SARS-CoV-2/FLU/RSV plus assay is intended as an aid in the diagnosis of influenza from Nasopharyngeal swab specimens and should not be used as a sole basis for treatment. Nasal washings and aspirates are unacceptable for Xpert Xpress SARS-CoV-2/FLU/RSV testing.  Fact Sheet for Patients: EntrepreneurPulse.com.au  Fact Sheet for Healthcare Providers: IncredibleEmployment.be  This test is not yet approved or cleared by the [place] FDA and has been authorized for detection and/or diagnosis of SARS-CoV-2 by FDA under an Emergency Use Authorization (EUA). This EUA will remain in effect (meaning this test can be used) for the duration of the COVID-19 declaration under Section 564(b)(1) of the Act, 21 U.S.C. section 360bbb-3(b)(1), unless the authorization is terminated or revoked.  Performed at [facility], [place] [address], [place], [place] [address]   Protime-INR     Status: None   Collection Time: 03/30/20  5:55 AM  Result Value Ref Range   Prothrombin Time 15.0 11.4 - 15.2 seconds   INR 1.2 0.8 - 1.2    Comment: (NOTE) INR goal varies based on device and disease states. Performed at [facility], [place] [address], [place], [place] [address]   CBC     Status: Abnormal   Collection Time: 03/30/20  5:55 AM  Result Value Ref Range   WBC 14.7 (H) 4.0 - 10.5 K/uL   RBC 3.28 (L) 4.22 - 5.81 MIL/uL   Hemoglobin 10.0 (L) 13.0 - 17.0 g/dL   HCT 31.1 (L) 39.0 - 52.0 %   MCV 94.8 80.0 - 100.0 fL   MCH 30.5 26.0 - 34.0 pg   MCHC 32.2 30.0 - 36.0 g/dL   RDW 13.3 11.5 - 15.5 %   Platelets 344 150 - 400 K/uL   nRBC 0.0 0.0 - 0.2 %    Comment: Performed at [facility], [place] [address] [place], [place] [address]  Hepatic function panel     Status: Abnormal   Collection Time: 03/30/20  5:55 AM  Result Value Ref Range   Total Protein 6.0 (L) 6.5 - 8.1 g/dL   Albumin 3.2 (L) 3.5 - 5.0 g/dL   AST 276 (H) 15 - 41 U/L   ALT 269 (H) 0 - 44 U/L   Alkaline Phosphatase 408 (H) 38 - 126 U/L   Total Bilirubin 4.3 (H) 0.3 - 1.2 mg/dL   Bilirubin, Direct 3.1 (H) 0.0 - 0.2 mg/dL   Indirect Bilirubin 1.2 (H) 0.3 - 0.9 mg/dL    Comment: Performed at [facility], [place] [address] [place], [place] [address]  Basic metabolic panel     Status: Abnormal   Collection Time: 03/30/20  5:55 AM  Result Value Ref Range   Sodium 137 135 - 145 mmol/L   Potassium 3.3 (L) 3.5 - 5.1 mmol/L   Chloride 104 98 - 111 mmol/L   CO2 24 22 - 32 mmol/L   Glucose, Bld 149 (H) 70 - 99 mg/dL    Comment: Glucose reference range applies only to samples taken after fasting for at least 8  hours.   BUN 24 (H) 8 - 23 mg/dL   Creatinine, Ser 1.03 0.61 - 1.24 mg/dL   Calcium 8.0 (L) 8.9 - 10.3 mg/dL   GFR, Estimated >60 >60 mL/min    Comment: (NOTE) Calculated using the CKD-EPI Creatinine  Equation (2021)    Anion gap 9 5 - 15    Comment: Performed at [facility], [place] [address] [place], [place] [address]  Magnesium     Status: None   Collection Time: 03/30/20  5:55 AM  Result Value Ref Range   Magnesium 2.0 1.7 - 2.4 mg/dL    Comment: Performed at [facility], [place] [address] [place], [place] [address]  Phosphorus     Status: None   Collection Time: 03/30/20  5:55 AM  Result Value Ref Range   Phosphorus 3.4 2.5 - 4.6 mg/dL    Comment: Performed at [facility], [place] [address], [place], [place] [address]    CT ABDOMEN PELVIS W CONTRAST  Result Date: 03/29/2020 CLINICAL DATA:  Epigastric pain since 3 a.m., vomiting EXAM: CT ABDOMEN AND PELVIS WITH CONTRAST TECHNIQUE: Multidetector CT imaging of the abdomen and pelvis was performed using the standard protocol following bolus administration of intravenous contrast. CONTRAST:  157mL OMNIPAQUE IOHEXOL 300 MG/ML  SOLN COMPARISON:  At 2613 FINDINGS: Lower chest: No acute pleural or parenchymal lung disease. Hepatobiliary: No focal liver abnormality is seen. No gallstones, gallbladder wall thickening, or biliary dilatation. Pancreas: Unremarkable. No pancreatic ductal dilatation or surrounding inflammatory changes. Spleen: Normal in size without focal abnormality. Adrenals/Urinary Tract: Numerous nonobstructing bilateral renal calculi are identified, measuring up to 5 mm on the right and 4 mm on the left. No evidence of obstructive uropathy within either kidney. Progressive areas of bilateral renal cortical scarring and thinning. The adrenals and bladder are unremarkable. Stomach/Bowel: No bowel obstruction or ileus. Diverticulosis of the distal colon with no evidence of acute diverticulitis. Normal appendix right lower quadrant. Postsurgical changes from previous small bowel resection. Vascular/Lymphatic: Aortic atherosclerosis. No enlarged abdominal or pelvic lymph nodes.  Reproductive: Prostate is mildly enlarged measuring 4.8 x 4.2 cm. Other: There is a complex fluid collection within the right inguinal canal extending into the right hemiscrotum, measuring approximately 8.6 x 7.4 x 10.6 cm in size. Overall this likely reflects a right inguinal hernia containing mesenteric fat and fluid, with concerns for an incarcerated hernia. No bowel herniation is identified. No free intraperitoneal fluid or free gas. No other abdominal wall hernia. Musculoskeletal: Chronic appearing L1 compression fracture. No other acute bony abnormalities. Reconstructed images demonstrate no additional findings. IMPRESSION: 1. Large incarcerated fat containing right inguinal hernia, with significant fluid in the hernia sac. No bowel herniation. 2. Bilateral nonobstructing renal calculi, with significant bilateral renal cortical scarring and atrophy. 3. Sigmoid diverticulosis without diverticulitis. 4.  Aortic Atherosclerosis (ICD10-I70.0). Electronically Signed   By: Randa Ngo M.D.   On: 03/29/2020 23:05   US Abdomen Limited RUQ (LIVER/GB)  Result Date: 03/30/2020 CLINICAL DATA:  Elevated liver function tests EXAM: ULTRASOUND ABDOMEN LIMITED RIGHT UPPER QUADRANT COMPARISON:  None. FINDINGS: Gallbladder: The gallbladder contains multiple stones measuring up to 12 mm in greatest diameter as well as layering sludge and there is moderate gallbladder wall thickening noted. The gallbladder, however, is not distended, there is no pericholecystic fluid identified, and the sonographic Percell [name] sign is reportedly negative. Common bile duct: Diameter: 2-3 mm Liver: No focal lesion identified. Within normal limits in parenchymal echogenicity. Portal vein is patent on  color Doppler imaging with normal direction of blood flow towards the liver. Other: No ascites IMPRESSION: Cholelithiasis with gallbladder wall thickening suggesting changes of chronic cholecystitis in the appropriate clinical setting. Electronically  Signed   By: Fidela Salisbury MD   On: 03/30/2020 00:25    ROS - all of the below systems have been reviewed with the patient and positives are indicated with bold text General: chills, fever or night sweats Eyes: blurry vision or double vision ENT: epistaxis or sore throat Allergy/Immunology: itchy/watery eyes or nasal congestion Hematologic/Lymphatic: bleeding problems, blood clots or swollen lymph nodes Endocrine: temperature intolerance or unexpected weight changes Breast: new or changing breast lumps or nipple discharge Resp: cough, shortness of breath, or wheezing CV: chest pain or dyspnea on exertion GI: as per HPI GU: dysuria, trouble voiding, or hematuria MSK: joint pain or joint stiffness Neuro: TIA or stroke symptoms Derm: pruritus and skin lesion changes Psych: anxiety and depression  PE Blood pressure (!) 130/58, pulse 97, temperature 98 F (36.7 C), temperature source Oral, resp. rate 16, height 5\' 5"  (1.651 m), weight 62.7 kg, SpO2 98 %. Constitutional: NAD; conversant; no deformities Eyes: Moist conjunctiva; no lid lag; anicteric; PERRL Neck: Trachea midline; no thyromegaly Lungs: Normal respiratory effort; no tactile fremitus CV: RRR; no palpable thrills; no pitting edema GI: Abd soft, TTP RUQ; no palpable hepatosplenomegaly MSK: Normal range of motion of extremities; no clubbing/cyanosis Psychiatric: Appropriate affect; alert and oriented x3 Lymphatic: No palpable cervical or axillary lymphadenopathy  Results for orders placed or performed during the hospital encounter of 03/29/20 (from the past 48 hour(s))  Lipase, blood     Status: Abnormal   Collection Time: 03/29/20  5:40 PM  Result Value Ref Range   Lipase 191 (H) 11 - 51 U/L    Comment: Performed at [facility], [place] [address], [place], [place] [address]  Comprehensive metabolic panel     Status: Abnormal   Collection Time: 03/29/20  5:40 PM  Result Value Ref Range   Sodium 138 135 -  145 mmol/L   Potassium 3.0 (L) 3.5 - 5.1 mmol/L   Chloride 101 98 - 111 mmol/L   CO2 23 22 - 32 mmol/L   Glucose, Bld 207 (H) 70 - 99 mg/dL    Comment: Glucose reference range applies only to samples taken after fasting for at least 8 hours.   BUN 27 (H) 8 - 23 mg/dL   Creatinine, Ser 1.18 0.61 - 1.24 mg/dL   Calcium 8.7 (L) 8.9 - 10.3 mg/dL   Total Protein 7.0 6.5 - 8.1 g/dL   Albumin 3.7 3.5 - 5.0 g/dL   AST 683 (H) 15 - 41 U/L   ALT 300 (H) 0 - 44 U/L   Alkaline Phosphatase 454 (H) 38 - 126 U/L   Total Bilirubin 3.1 (H) 0.3 - 1.2 mg/dL   GFR, Estimated >60 >60 mL/min    Comment: (NOTE) Calculated using the CKD-EPI Creatinine Equation (2021)    Anion gap 14 5 - 15    Comment: Performed at [facility], [place] [address], [place], [place] [address]  CBC     Status: Abnormal   Collection Time: 03/29/20  5:40 PM  Result Value Ref Range   WBC 12.6 (H) 4.0 - 10.5 K/uL   RBC 3.77 (L) 4.22 - 5.81 MIL/uL   Hemoglobin 11.5 (L) 13.0 - 17.0 g/dL   HCT 35.2 (L) 39.0 - 52.0 %   MCV 93.4 80.0 - 100.0 fL  MCH 30.5 26.0 - 34.0 pg   MCHC 32.7 30.0 - 36.0 g/dL   RDW 13.0 11.5 - 15.5 %   Platelets 383 150 - 400 K/uL   nRBC 0.0 0.0 - 0.2 %    Comment: Performed at [facility], [place] [address], [place], [place] [address]  Urinalysis, Routine w reflex microscopic Urine, Clean Catch     Status: Abnormal   Collection Time: 03/29/20 11:30 PM  Result Value Ref Range   Color, Urine AMBER (A) YELLOW    Comment: BIOCHEMICALS MAY BE AFFECTED BY COLOR   APPearance CLEAR CLEAR   Specific Gravity, Urine 1.032 (H) 1.005 - 1.030   pH 7.0 5.0 - 8.0   Glucose, UA NEGATIVE NEGATIVE mg/dL   Hgb urine dipstick NEGATIVE NEGATIVE   Bilirubin Urine NEGATIVE NEGATIVE   Ketones, ur NEGATIVE NEGATIVE mg/dL   Protein, ur NEGATIVE NEGATIVE mg/dL   Nitrite NEGATIVE NEGATIVE   Leukocytes,Ua NEGATIVE NEGATIVE    Comment: Performed at [place]  [address] [place], [place] [address]  Resp Panel by RT-PCR (Flu A&B, Covid) Nasopharyngeal Swab     Status: None   Collection Time: 03/30/20 12:36 AM   Specimen: Nasopharyngeal Swab; Nasopharyngeal(NP) swabs in vial transport medium  Result Value Ref Range   SARS Coronavirus 2 by RT PCR NEGATIVE NEGATIVE    Comment: (NOTE) SARS-CoV-2 target nucleic acids are NOT DETECTED.  The SARS-CoV-2 RNA is generally detectable in upper respiratory specimens during the acute phase of infection. The lowest concentration of SARS-CoV-2 viral copies this assay can detect is 138 copies/mL. A negative result does not preclude SARS-Cov-2 infection and should not be used as the sole basis for treatment or other patient management decisions. A negative result may occur with  improper specimen collection/handling, submission of specimen other than nasopharyngeal swab, presence of viral mutation(s) within the areas targeted by this assay, and inadequate number of viral copies(<138 copies/mL). A negative result must be combined with clinical observations, patient history, and epidemiological information. The expected result is Negative.  Fact Sheet for Patients:  EntrepreneurPulse.com.au  Fact Sheet for Healthcare Providers:  IncredibleEmployment.be  This test is no t yet approved or cleared by the [place] FDA and  has been authorized for detection and/or diagnosis of SARS-CoV-2 by FDA under an Emergency Use Authorization (EUA). This EUA will remain  in effect (meaning this test can be used) for the duration of the COVID-19 declaration under Section 564(b)(1) of the Act, 21 U.S.C.section 360bbb-3(b)(1), unless the authorization is terminated  or revoked sooner.       Influenza A by PCR NEGATIVE NEGATIVE   Influenza B by PCR NEGATIVE NEGATIVE    Comment: (NOTE) The Xpert Xpress SARS-CoV-2/FLU/RSV plus assay is intended as an aid in the diagnosis of influenza  from Nasopharyngeal swab specimens and should not be used as a sole basis for treatment. Nasal washings and aspirates are unacceptable for Xpert Xpress SARS-CoV-2/FLU/RSV testing.  Fact Sheet for Patients: EntrepreneurPulse.com.au  Fact Sheet for Healthcare Providers: IncredibleEmployment.be  This test is not yet approved or cleared by the [place] FDA and has been authorized for detection and/or diagnosis of SARS-CoV-2 by FDA under an Emergency Use Authorization (EUA). This EUA will remain in effect (meaning this test can be used) for the duration of the COVID-19 declaration under Section 564(b)(1) of the Act, 21 U.S.C. section 360bbb-3(b)(1), unless the authorization is terminated or revoked.  Performed at [facility], [place] [address]  [place], [place] [address]   Protime-INR     Status: None   Collection Time: 03/30/20  5:55 AM  Result Value Ref Range   Prothrombin Time 15.0 11.4 - 15.2 seconds   INR 1.2 0.8 - 1.2    Comment: (NOTE) INR goal varies based on device and disease states. Performed at [facility], [place] [address] [place], [place] [address]   CBC     Status: Abnormal   Collection Time: 03/30/20  5:55 AM  Result Value Ref Range   WBC 14.7 (H) 4.0 - 10.5 K/uL   RBC 3.28 (L) 4.22 - 5.81 MIL/uL   Hemoglobin 10.0 (L) 13.0 - 17.0 g/dL   HCT 31.1 (L) 39.0 - 52.0 %   MCV 94.8 80.0 - 100.0 fL   MCH 30.5 26.0 - 34.0 pg   MCHC 32.2 30.0 - 36.0 g/dL   RDW 13.3 11.5 - 15.5 %   Platelets 344 150 - 400 K/uL   nRBC 0.0 0.0 - 0.2 %    Comment: Performed at [facility], [place] [address] , [place] [address]  Hepatic function panel     Status: Abnormal   Collection Time: 03/30/20  5:55 AM  Result Value Ref Range   Total Protein 6.0 (L) 6.5 - 8.1 g/dL   Albumin 3.2 (L) 3.5 - 5.0 g/dL   AST 276 (H) 15 - 41 U/L   ALT 269 (H) 0 - 44 U/L   Alkaline Phosphatase 408 (H) 38  - 126 U/L   Total Bilirubin 4.3 (H) 0.3 - 1.2 mg/dL   Bilirubin, Direct 3.1 (H) 0.0 - 0.2 mg/dL   Indirect Bilirubin 1.2 (H) 0.3 - 0.9 mg/dL    Comment: Performed at [name], [place] [address] [place], [place] [address]  Basic metabolic panel     Status: Abnormal   Collection Time: 03/30/20  5:55 AM  Result Value Ref Range   Sodium 137 135 - 145 mmol/L   Potassium 3.3 (L) 3.5 - 5.1 mmol/L   Chloride 104 98 - 111 mmol/L   CO2 24 22 - 32 mmol/L   Glucose, Bld 149 (H) 70 - 99 mg/dL    Comment: Glucose reference range applies only to samples taken after fasting for at least 8 hours.   BUN 24 (H) 8 - 23 mg/dL   Creatinine, Ser 1.03 0.61 - 1.24 mg/dL   Calcium 8.0 (L) 8.9 - 10.3 mg/dL   GFR, Estimated >60 >60 mL/min    Comment: (NOTE) Calculated using the CKD-EPI Creatinine Equation (2021)    Anion gap 9 5 - 15    Comment: Performed at [facility], [place] [address] [place], [place] [address]  Magnesium     Status: None   Collection Time: 03/30/20  5:55 AM  Result Value Ref Range   Magnesium 2.0 1.7 - 2.4 mg/dL    Comment: Performed at [facility], [place] [address], [place], [place] [address]  Phosphorus     Status: None   Collection Time: 03/30/20  5:55 AM  Result Value Ref Range   Phosphorus 3.4 2.5 - 4.6 mg/dL    Comment: Performed at [facility], [place] [address] [place],  [address]    CT ABDOMEN PELVIS W CONTRAST  Result Date: 03/29/2020 CLINICAL DATA:  Epigastric pain since 3 a.m., vomiting EXAM: CT ABDOMEN AND PELVIS WITH CONTRAST TECHNIQUE: Multidetector CT imaging of the abdomen and pelvis was performed using the standard  protocol following bolus administration of intravenous contrast. CONTRAST:  19mL OMNIPAQUE IOHEXOL 300 MG/ML  SOLN COMPARISON:  At 2613 FINDINGS: Lower chest: No acute pleural or parenchymal lung disease. Hepatobiliary: No focal liver abnormality is seen. No gallstones,  gallbladder wall thickening, or biliary dilatation. Pancreas: Unremarkable. No pancreatic ductal dilatation or surrounding inflammatory changes. Spleen: Normal in size without focal abnormality. Adrenals/Urinary Tract: Numerous nonobstructing bilateral renal calculi are identified, measuring up to 5 mm on the right and 4 mm on the left. No evidence of obstructive uropathy within either kidney. Progressive areas of bilateral renal cortical scarring and thinning. The adrenals and bladder are unremarkable. Stomach/Bowel: No bowel obstruction or ileus. Diverticulosis of the distal colon with no evidence of acute diverticulitis. Normal appendix right lower quadrant. Postsurgical changes from previous small bowel resection. Vascular/Lymphatic: Aortic atherosclerosis. No enlarged abdominal or pelvic lymph nodes. Reproductive: Prostate is mildly enlarged measuring 4.8 x 4.2 cm. Other: There is a complex fluid collection within the right inguinal canal extending into the right hemiscrotum, measuring approximately 8.6 x 7.4 x 10.6 cm in size. Overall this likely reflects a right inguinal hernia containing mesenteric fat and fluid, with concerns for an incarcerated hernia. No bowel herniation is identified. No free intraperitoneal fluid or free gas. No other abdominal wall hernia. Musculoskeletal: Chronic appearing L1 compression fracture. No other acute bony abnormalities. Reconstructed images demonstrate no additional findings. IMPRESSION: 1. Large incarcerated fat containing right inguinal hernia, with significant fluid in the hernia sac. No bowel herniation. 2. Bilateral nonobstructing renal calculi, with significant bilateral renal cortical scarring and atrophy. 3. Sigmoid diverticulosis without diverticulitis. 4.  Aortic Atherosclerosis (ICD10-I70.0). Electronically Signed   By: Randa Ngo M.D.   On: 03/29/2020 23:05   US Abdomen Limited RUQ (LIVER/GB)  Result Date: 03/30/2020 CLINICAL DATA:  Elevated liver  function tests EXAM: ULTRASOUND ABDOMEN LIMITED RIGHT UPPER QUADRANT COMPARISON:  None. FINDINGS: Gallbladder: The gallbladder contains multiple stones measuring up to 12 mm in greatest diameter as well as layering sludge and there is moderate gallbladder wall thickening noted. The gallbladder, however, is not distended, there is no pericholecystic fluid identified, and the sonographic Percell [name] sign is reportedly negative. Common bile duct: Diameter: 2-3 mm Liver: No focal lesion identified. Within normal limits in parenchymal echogenicity. Portal vein is patent on color Doppler imaging with normal direction of blood flow towards the liver. Other: No ascites IMPRESSION: Cholelithiasis with gallbladder wall thickening suggesting changes of chronic cholecystitis in the appropriate clinical setting. Electronically Signed   By: Fidela Salisbury MD   On: 03/30/2020 00:25     A/P: SHAYE LAGACE is an 80 y.o. male with what appears obstructive cholecystitis.  Recommend HIDA.  May need GI eval. Ok for clears if NPO not needed for HIDA    Rosario Adie, MD  [facility]

## 2020-03-31 ENCOUNTER — Inpatient Hospital Stay (HOSPITAL_COMMUNITY): Payer: Medicare Other

## 2020-03-31 ENCOUNTER — Encounter (HOSPITAL_COMMUNITY)
Admission: EM | Disposition: A | Discharge status: Home / Self Care | Payer: Self-pay | Source: Home / Self Care | Attending: Internal Medicine

## 2020-03-31 ENCOUNTER — Inpatient Hospital Stay (HOSPITAL_COMMUNITY): Payer: Medicare Other | Admitting: Certified Registered Nurse Anesthetist

## 2020-03-31 DIAGNOSIS — K7589 Other specified inflammatory liver diseases: Secondary | ICD-10-CM | POA: Diagnosis not present

## 2020-03-31 HISTORY — PX: CHOLECYSTECTOMY: SHX55

## 2020-03-31 LAB — CBC
HCT: 30.3 % — ABNORMAL LOW (ref 39.0–52.0)
Hemoglobin: 9.4 g/dL — ABNORMAL LOW (ref 13.0–17.0)
MCH: 29.8 pg (ref 26.0–34.0)
MCHC: 31 g/dL (ref 30.0–36.0)
MCV: 96.2 fL (ref 80.0–100.0)
Platelets: 297 10*3/uL (ref 150–400)
RBC: 3.15 MIL/uL — ABNORMAL LOW (ref 4.22–5.81)
RDW: 13.5 % (ref 11.5–15.5)
WBC: 9.8 10*3/uL (ref 4.0–10.5)
nRBC: 0 % (ref 0.0–0.2)

## 2020-03-31 LAB — HEPATIC FUNCTION PANEL
ALT: 157 U/L — ABNORMAL HIGH (ref 0–44)
AST: 66 U/L — ABNORMAL HIGH (ref 15–41)
Albumin: 2.8 g/dL — ABNORMAL LOW (ref 3.5–5.0)
Alkaline Phosphatase: 355 U/L — ABNORMAL HIGH (ref 38–126)
Bilirubin, Direct: 3.1 mg/dL — ABNORMAL HIGH (ref 0.0–0.2)
Indirect Bilirubin: 1.4 mg/dL — ABNORMAL HIGH (ref 0.3–0.9)
Total Bilirubin: 4.5 mg/dL — ABNORMAL HIGH (ref 0.3–1.2)
Total Protein: 5.6 g/dL — ABNORMAL LOW (ref 6.5–8.1)

## 2020-03-31 LAB — GLUCOSE, CAPILLARY
Glucose-Capillary: 77 mg/dL (ref 70–99)
Glucose-Capillary: 73 mg/dL (ref 70–99)
Glucose-Capillary: 79 mg/dL (ref 70–99)
Glucose-Capillary: 72 mg/dL (ref 70–99)
Glucose-Capillary: 82 mg/dL (ref 70–99)
Glucose-Capillary: 90 mg/dL (ref 70–99)
Glucose-Capillary: 91 mg/dL (ref 70–99)
Glucose-Capillary: 119 mg/dL — ABNORMAL HIGH (ref 70–99)

## 2020-03-31 LAB — BASIC METABOLIC PANEL
Anion gap: 9 (ref 5–15)
BUN: 20 mg/dL (ref 8–23)
CO2: 20 mmol/L — ABNORMAL LOW (ref 22–32)
Calcium: 8 mg/dL — ABNORMAL LOW (ref 8.9–10.3)
Chloride: 110 mmol/L (ref 98–111)
Creatinine, Ser: 0.92 mg/dL (ref 0.61–1.24)
GFR, Estimated: >60 mL/min (ref >60)
Glucose, Bld: 93 mg/dL (ref 70–99)
Potassium: 3.3 mmol/L — ABNORMAL LOW (ref 3.5–5.1)
Sodium: 139 mmol/L (ref 135–145)

## 2020-03-31 SURGERY — LAPAROSCOPIC CHOLECYSTECTOMY WITH INTRAOPERATIVE CHOLANGIOGRAM
Anesthesia: General

## 2020-03-31 MED ORDER — LIDOCAINE 2% (20 MG/ML) 5 ML SYRINGE
INTRAMUSCULAR | Status: AC
  Filled 2020-03-31: qty 5

## 2020-03-31 MED ORDER — FENTANYL CITRATE (PF) 100 MCG/2ML IJ SOLN
INTRAMUSCULAR | Status: AC
  Filled 2020-03-31: qty 2

## 2020-03-31 MED ORDER — DEXAMETHASONE SODIUM PHOSPHATE 10 MG/ML IJ SOLN
INTRAMUSCULAR | Status: AC
  Filled 2020-03-31: qty 1

## 2020-03-31 MED ORDER — ROCURONIUM BROMIDE 10 MG/ML (PF) SYRINGE
PREFILLED_SYRINGE | INTRAVENOUS | Status: AC
  Filled 2020-03-31: qty 10

## 2020-03-31 MED ORDER — CHLORHEXIDINE GLUCONATE 0.12 % MT SOLN
15.0000 mL | OROMUCOSAL | Status: AC
  Administered 2020-03-31: 15 mL via OROMUCOSAL

## 2020-03-31 MED ORDER — PROPOFOL 10 MG/ML IV BOLUS
INTRAVENOUS | Status: DC | PRN
  Administered 2020-03-31: 120 mg via INTRAVENOUS

## 2020-03-31 MED ORDER — ONDANSETRON HCL 4 MG/2ML IJ SOLN
INTRAMUSCULAR | Status: AC
  Filled 2020-03-31: qty 2

## 2020-03-31 MED ORDER — ONDANSETRON 4 MG PO TBDP: 4.0000 mg | ORAL_TABLET | Freq: Four times a day (QID) | ORAL | Status: DC | PRN

## 2020-03-31 MED ORDER — METOPROLOL TARTRATE 5 MG/5ML IV SOLN: 5.0000 mg | Freq: Four times a day (QID) | INTRAVENOUS | Status: DC | PRN

## 2020-03-31 MED ORDER — SODIUM CHLORIDE 0.9 % IR SOLN
Status: DC | PRN
  Administered 2020-03-31: 1000 mL

## 2020-03-31 MED ORDER — IOPAMIDOL (ISOVUE-300) INJECTION 61%
INTRAVENOUS | Status: DC | PRN
  Administered 2020-03-31: 9 mL

## 2020-03-31 MED ORDER — HEPARIN SODIUM (PORCINE) 5000 UNIT/ML IJ SOLN
5000.0000 [IU] | Freq: Three times a day (TID) | INTRAMUSCULAR | Status: DC
  Administered 2020-03-31 – 2020-04-01 (×2): 5000 [IU] via SUBCUTANEOUS
  Filled 2020-03-31: qty 1

## 2020-03-31 MED ORDER — SUGAMMADEX SODIUM 200 MG/2ML IV SOLN
INTRAVENOUS | Status: DC | PRN
  Administered 2020-03-31: 200 mg via INTRAVENOUS

## 2020-03-31 MED ORDER — LACTATED RINGERS IV SOLN: Freq: Once | INTRAVENOUS | Status: AC

## 2020-03-31 MED ORDER — FENTANYL CITRATE (PF) 100 MCG/2ML IJ SOLN: 25.0000 ug | INTRAMUSCULAR | Status: DC | PRN

## 2020-03-31 MED ORDER — ESMOLOL HCL 100 MG/10ML IV SOLN
INTRAVENOUS | Status: DC | PRN
  Administered 2020-03-31: 20 mg via INTRAVENOUS

## 2020-03-31 MED ORDER — BUPIVACAINE LIPOSOME 1.3 % IJ SUSP
20.0000 mL | Freq: Once | INTRAMUSCULAR | Status: AC
  Administered 2020-03-31: 20 mL
  Filled 2020-03-31: qty 20

## 2020-03-31 MED ORDER — PROPOFOL 10 MG/ML IV BOLUS
INTRAVENOUS | Status: AC
  Filled 2020-03-31: qty 20

## 2020-03-31 MED ORDER — ONDANSETRON HCL 4 MG/2ML IJ SOLN
INTRAMUSCULAR | Status: DC | PRN
  Administered 2020-03-31: 4 mg via INTRAVENOUS

## 2020-03-31 MED ORDER — SUGAMMADEX SODIUM 200 MG/2ML IV SOLN: INTRAVENOUS | Status: DC | PRN

## 2020-03-31 MED ORDER — PANTOPRAZOLE SODIUM 40 MG IV SOLR
40.0000 mg | Freq: Every day | INTRAVENOUS | Status: DC
  Administered 2020-03-31: 40 mg via INTRAVENOUS
  Filled 2020-03-31: qty 40

## 2020-03-31 MED ORDER — ESMOLOL HCL 100 MG/10ML IV SOLN
INTRAVENOUS | Status: AC
  Filled 2020-03-31: qty 10

## 2020-03-31 MED ORDER — PHENYLEPHRINE HCL (PRESSORS) 10 MG/ML IV SOLN
INTRAVENOUS | Status: AC
  Filled 2020-03-31: qty 1

## 2020-03-31 MED ORDER — ONDANSETRON HCL 4 MG/2ML IJ SOLN: 4.0000 mg | Freq: Four times a day (QID) | INTRAMUSCULAR | Status: DC | PRN

## 2020-03-31 MED ORDER — LIDOCAINE 2% (20 MG/ML) 5 ML SYRINGE
INTRAMUSCULAR | Status: DC | PRN
  Administered 2020-03-31: 60 mg via INTRAVENOUS

## 2020-03-31 MED ORDER — LACTATED RINGERS IV SOLN: INTRAVENOUS | Status: DC

## 2020-03-31 MED ORDER — LACTATED RINGERS IV SOLN: INTRAVENOUS | Status: DC | PRN

## 2020-03-31 MED ORDER — FENTANYL CITRATE (PF) 100 MCG/2ML IJ SOLN
INTRAMUSCULAR | Status: DC | PRN
  Administered 2020-03-31 (×2): 50 ug via INTRAVENOUS
  Administered 2020-03-31: 25 ug via INTRAVENOUS

## 2020-03-31 MED ORDER — DEXAMETHASONE SODIUM PHOSPHATE 4 MG/ML IJ SOLN
INTRAMUSCULAR | Status: DC | PRN
  Administered 2020-03-31: 4 mg via INTRAVENOUS

## 2020-03-31 MED ORDER — FENTANYL CITRATE (PF) 100 MCG/2ML IJ SOLN: 12.5000 ug | INTRAMUSCULAR | Status: DC | PRN

## 2020-03-31 MED ORDER — PHENYLEPHRINE HCL-NACL 10-0.9 MG/250ML-% IV SOLN
INTRAVENOUS | Status: DC | PRN
  Administered 2020-03-31: 20 ug/min via INTRAVENOUS

## 2020-03-31 MED ORDER — 0.9 % SODIUM CHLORIDE (POUR BTL) OPTIME
TOPICAL | Status: DC | PRN
  Administered 2020-03-31: 1000 mL

## 2020-03-31 MED ORDER — ROCURONIUM BROMIDE 10 MG/ML (PF) SYRINGE
PREFILLED_SYRINGE | INTRAVENOUS | Status: DC | PRN
  Administered 2020-03-31: 40 mg via INTRAVENOUS

## 2020-03-31 SURGICAL SUPPLY — 48 items
ADH SKN CLS APL DERMABOND .7 (GAUZE/BANDAGES/DRESSINGS) ×1
APL SKNCLS STERI-STRIP NONHPOA (GAUZE/BANDAGES/DRESSINGS)
APL SWBSTK 6 STRL LF DISP (MISCELLANEOUS) ×2
APPLICATOR COTTON TIP 6 STRL (MISCELLANEOUS) ×2 IMPLANT
APPLICATOR COTTON TIP 6IN STRL (MISCELLANEOUS) ×4 IMPLANT
APPLIER CLIP 5 13 M/L LIGAMAX5 (MISCELLANEOUS)
APPLIER CLIP ROT 10 11.4 M/L (STAPLE) ×2
APR CLP MED LRG 11.4X10 (STAPLE) ×1
APR CLP MED LRG 5 ANG JAW (MISCELLANEOUS)
BAG SPEC RTRVL 10 TROC 200 (ENDOMECHANICALS) ×1
BENZOIN TINCTURE PRP APPL 2/3 (GAUZE/BANDAGES/DRESSINGS) IMPLANT
CABLE HIGH FREQUENCY MONO STRZ (ELECTRODE) IMPLANT
CATH REDDICK CHOLANGI 4FR 50CM (CATHETERS) ×2 IMPLANT
CLIP APPLIE 5 13 M/L LIGAMAX5 (MISCELLANEOUS) IMPLANT
CLIP APPLIE ROT 10 11.4 M/L (STAPLE) IMPLANT
COVER MAYO STAND STRL (DRAPES) ×2 IMPLANT
COVER SURGICAL LIGHT HANDLE (MISCELLANEOUS) ×2 IMPLANT
COVER WAND RF STERILE (DRAPES) IMPLANT
DECANTER SPIKE VIAL GLASS SM (MISCELLANEOUS) ×1 IMPLANT
DERMABOND ADVANCED (GAUZE/BANDAGES/DRESSINGS) ×1
DERMABOND ADVANCED .7 DNX12 (GAUZE/BANDAGES/DRESSINGS) ×1 IMPLANT
DRAPE C-ARM 42X120 X-RAY (DRAPES) ×2 IMPLANT
ELECT L-HOOK LAP 45CM DISP (ELECTROSURGICAL)
ELECT PENCIL ROCKER SW 15FT (MISCELLANEOUS) IMPLANT
ELECT REM PT RETURN 15FT ADLT (MISCELLANEOUS) ×2 IMPLANT
ELECTRODE L-HOOK LAP 45CM DISP (ELECTROSURGICAL) IMPLANT
GLOVE BIOGEL M 8.0 STRL (GLOVE) ×2 IMPLANT
GOWN STRL REUS W/TWL XL LVL3 (GOWN DISPOSABLE) ×2 IMPLANT
HEMOSTAT SURGICEL 4X8 (HEMOSTASIS) IMPLANT
IV CATH 14GX2 1/4 (CATHETERS) ×2 IMPLANT
KIT BASIN OR (CUSTOM PROCEDURE TRAY) ×2 IMPLANT
KIT TURNOVER KIT A (KITS) ×2 IMPLANT
POUCH RETRIEVAL ECOSAC 10 (ENDOMECHANICALS) IMPLANT
POUCH RETRIEVAL ECOSAC 10MM (ENDOMECHANICALS) ×2
SCISSORS LAP 5X45 EPIX DISP (ENDOMECHANICALS) ×2 IMPLANT
SET IRRIG TUBING LAPAROSCOPIC (IRRIGATION / IRRIGATOR) ×2 IMPLANT
SET TUBE SMOKE EVAC HIGH FLOW (TUBING) ×2 IMPLANT
SLEEVE XCEL OPT CAN 5 100 (ENDOMECHANICALS) ×2 IMPLANT
STRIP CLOSURE SKIN 1/2X4 (GAUZE/BANDAGES/DRESSINGS) IMPLANT
SUT MNCRL AB 4-0 PS2 18 (SUTURE) ×4 IMPLANT
SUT PROLENE 2 0 SH DA (SUTURE) ×1 IMPLANT
SUT VICRYL 0 UR6 27IN ABS (SUTURE) ×1 IMPLANT
SYR 20ML LL LF (SYRINGE) ×2 IMPLANT
TOWEL OR 17X26 10 PK STRL BLUE (TOWEL DISPOSABLE) ×2 IMPLANT
TRAY LAPAROSCOPIC (CUSTOM PROCEDURE TRAY) ×2 IMPLANT
TROCAR BLADELESS OPT 5 100 (ENDOMECHANICALS) ×2 IMPLANT
TROCAR XCEL BLUNT TIP 100MML (ENDOMECHANICALS) IMPLANT
TROCAR XCEL NON-BLD 11X100MML (ENDOMECHANICALS) IMPLANT

## 2020-03-31 NOTE — Anesthesia Preprocedure Evaluation (Addendum)
Anesthesia Evaluation  Patient identified by MRN, date of birth, ID band Patient awake    Reviewed: Allergy & Precautions, NPO status , Patient's Chart, lab work & pertinent test results  History of Anesthesia Complications (+) PROLONGED EMERGENCE  Airway Mallampati: II  TM Distance: >3 FB Neck ROM: Full    Dental  (+) Chipped, Dental Advisory Given   Pulmonary sleep apnea and Continuous Positive Airway Pressure Ventilation ,  03/30/2020 SARS coronavirus NEG   breath sounds clear to auscultation       Cardiovascular hypertension, Pt. on medications (-) angina Rhythm:Regular Rate:Normal     Neuro/Psych Depression negative neurological ROS     GI/Hepatic GERD  Medicated and Controlled,(+) Hepatitis -Acute cholecystitis: markedly elevated LFTs   Endo/Other  diabetes (glu 79), Oral Hypoglycemic Agents  Renal/GU Renal InsufficiencyRenal disease     Musculoskeletal   Abdominal   Peds  Hematology  (+) Blood dyscrasia (Hb 9.4), anemia ,   Anesthesia Other Findings   Reproductive/Obstetrics                            Anesthesia Physical Anesthesia Plan  ASA: III  Anesthesia Plan: General   Post-op Pain Management:    Induction: Intravenous  PONV Risk Score and Plan: 2 and Ondansetron and Dexamethasone  Airway Management Planned: Oral ETT  Additional Equipment: None  Intra-op Plan:   Post-operative Plan: Extubation in OR  Informed Consent: I have reviewed the patients History and Physical, chart, labs and discussed the procedure including the risks, benefits and alternatives for the proposed anesthesia with the patient or authorized representative who has indicated his/her understanding and acceptance.    Suspend DNR.   Dental advisory given  Plan Discussed with: CRNA and Surgeon  Anesthesia Plan Comments: (Pt denies he is DNR, agrees with full resuscitation)        Anesthesia Quick Evaluation

## 2020-03-31 NOTE — Anesthesia Postprocedure Evaluation (Signed)
Anesthesia Post Note  Patient: [name]  Procedure(s) Performed: LAPAROSCOPIC CHOLECYSTECTOMY WITH INTRAOPERATIVE CHOLANGIOGRAM, TAP BLOCK (N/A )     Patient location during evaluation: PACU Anesthesia Type: General Level of consciousness: awake and alert Pain management: pain level controlled Vital Signs Assessment: post-procedure vital signs reviewed and stable Respiratory status: spontaneous breathing, nonlabored ventilation, respiratory function stable and patient connected to nasal cannula oxygen Cardiovascular status: blood pressure returned to baseline and stable Postop Assessment: no apparent nausea or vomiting Anesthetic complications: no   No complications documented.  Last Vitals:  Vitals:   03/31/20 2001 03/31/20 2142  BP:  (!) 176/97  Pulse: 67 96  Resp: 15 20  Temp:  36.5 C  SpO2: 98% 100%    Last Pain:  Vitals:   03/31/20 2142  TempSrc: Oral  PainSc:                  Osby Sweetin L Summar Mcglothlin

## 2020-03-31 NOTE — Transfer of Care (Signed)
Immediate Anesthesia Transfer of Care Note  Patient: Antonio [name]  Procedure(s) Performed: LAPAROSCOPIC CHOLECYSTECTOMY WITH INTRAOPERATIVE CHOLANGIOGRAM, TAP BLOCK (N/A )  Patient Location: PACU  Anesthesia Type:General  Level of Consciousness: drowsy and patient cooperative  Airway & Oxygen Therapy: Patient Spontanous Breathing and Patient connected to face mask oxygen  Post-op Assessment: Report given to RN and Post -op Vital signs reviewed and stable  Post vital signs: Reviewed and stable  Last Vitals:  Vitals Value Taken Time  BP 152/70 03/31/20 1752  Temp    Pulse 64 03/31/20 1757  Resp 18 03/31/20 1757  SpO2 100 % 03/31/20 1757  Vitals shown include unvalidated device data.  Last Pain:  Vitals:   03/31/20 0929  TempSrc:   PainSc: 0-No pain      Patients Stated Pain Goal: 0 (52/77/82 [id number])  Complications: No complications documented.

## 2020-03-31 NOTE — Interval H&P Note (Signed)
History and Physical Interval Note:  03/31/2020 2:52 PM  Antonio [name]  has presented today for surgery, with the diagnosis of Acute Cholicystitis.  The various methods of treatment have been discussed with the patient and family. After consideration of risks, benefits and other options for treatment, the patient has consented to  Procedure(s): LAPAROSCOPIC CHOLECYSTECTOMY WITH INTRAOPERATIVE CHOLANGIOGRAM (N/A) as a surgical intervention.  The patient's history has been reviewed, patient examined, no change in status, stable for surgery.  I have reviewed the patient's chart and labs.  Questions were answered to the patient's satisfaction.     Pedro Earls

## 2020-03-31 NOTE — Anesthesia Procedure Notes (Signed)
Procedure Name: Intubation Date/Time: 03/31/2020 4:01 PM Performed by: Montel Clock, CRNA Pre-anesthesia Checklist: Patient identified, Emergency Drugs available, Suction available, Patient being monitored and Timeout performed Patient Re-evaluated:Patient Re-evaluated prior to induction Oxygen Delivery Method: Circle system utilized Preoxygenation: Pre-oxygenation with 100% oxygen Induction Type: IV induction Ventilation: Mask ventilation without difficulty and Oral airway inserted - appropriate to patient size Laryngoscope Size: 3 and Glidescope Grade View: Grade I Tube type: Oral Tube size: 7.5 mm Number of attempts: 1 Airway Equipment and Method: Video-laryngoscopy and Rigid stylet Placement Confirmation: ETT inserted through vocal cords under direct vision,  positive ETCO2 and breath sounds checked- equal and bilateral Secured at: 22 cm Tube secured with: Tape Dental Injury: Teeth and Oropharynx as per pre-operative assessment  Comments: Used glidescope related to prior difficulty per records.

## 2020-03-31 NOTE — Progress Notes (Signed)
PROGRESS NOTE    Antonio [name]  PYP:[id number] DOB: 1940/12/18 DOA: 03/29/2020 PCP: Leeroy Cha, MD   Chief Complaint  Patient presents with  . Abdominal Pain  Brief Narrative: 80 year old male with significant history of OSA on CPAP, hyperlipidemia, hypertension, diabetes, BPH, chronic back pain, recent hernia repair 03/11/2020 presents with abdominal pain. As per repot developed real bad pain right at the xiphoid region -went away and he ate something but it came back "double  at 0300 3/7 AM after having eaten a good supper and at [employer].  In the ED right upper quadrant ultrasound suggestive of obstructive cholecystitis, CT with probable postsurgical hematoma, HIDA scan was performed showed bile duct obstruction from choledocholithiasis with abnormal LFTs GI was consulted, surgery was consulted was ordered MRCP, kept n.p.o. and was admitted overnight at empiric antibiotics and pain control MRCP came back later showing cholelithiasis with findings history of acute cholecystitis , 3 small gallstone in the gallbladder neck and proximal cystic duct, no visible stone on CBD, mild bilateral perinephric stranding compression deformity Patient was reassessed by surgery 3/9 a.m. and plan for for cholecystectomy  Subjective: Seen/examined Went down to [facility] for cholecystectomy Seen in [facility] C/o swelling on rt inguinal area which is improving  Assessment & Plan:  Abdominal pain nausea vomiting Abnormal LFTs Acute cholecystitis Patient was seen by GI and surgery underwent extensive work-up with CT abdomen, right upper quadrant ultrasound, HIDA scan and eventually MRCP that showed acute cholecystitis and taken to the OR this morning. Pain control w/ po/iv opiates.  Recent hernia repair- Scrotal hernia - right, s/p lap repair w mesh 03/11/2020. Inguinal hernia, left, s/p lap repair w mesh 03/11/2020: healing. Rt groin swelling post op- improving as per him.non tender swelling.  Hypokalemia  replete and monitor  OSA on cpap  HTN- holding hctz, prn meds  Hyperlipidemia-not taking meds.  DM: hba1c 7.5. sugar stable, keep on sliding scale insulin. 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  DVT prophylaxis: SCDs Start: 03/30/20 0947 Code Status:   Code Status: DNR  Family Communication: plan of care discussed with patient and discussed with the daughter in th room  Status is: Inpatient Remains inpatient appropriate because:IV treatments appropriate due to intensity of illness or inability to take PO and Inpatient level of care appropriate due to severity of illness  Dispo: The patient is from: Home              Anticipated d/c is to: tbd              Patient currently is not medically stable to d/c.   Difficult to place patient No   Unresulted Labs (From admission, onward)          Start     Ordered   03/30/20 0500  Hepatic function panel  Daily,   R      03/30/20 0215  Medications reviewed:  Scheduled Meds: . influenza vaccine adjuvanted  0.5 mL Intramuscular Tomorrow-1000  . [MAR Hold] insulin aspart  0-15 Units Subcutaneous TID WC  . [MAR Hold] insulin aspart  0-5 Units Subcutaneous QHS  . [MAR Hold] lamoTRIgine  25 mg Oral Daily  . [MAR Hold] potassium chloride SA  20 mEq Oral Daily  . [MAR Hold] tamsulosin  0.4 mg Oral QPC breakfast   Continuous Infusions: . 0.9 % NaCl with KCl 20 mEq / L 100 mL/hr at 03/30/20 2130  . [MAR Hold] cefTRIAXone (ROCEPHIN)  IV 2  g (03/30/20 2124)  . [MAR Hold] famotidine (PEPCID) IV 20 mg (03/30/20 2335)  . [MAR Hold] metronidazole 500 mg (03/31/20 0420)    Consultants:see note  Procedures:see note  Antimicrobials: Anti-infectives (From admission, onward)   Start     Dose/Rate Route Frequency Ordered Stop   03/30/20 2200  [MAR Hold]  cefTRIAXone (ROCEPHIN) 2 g in sodium chloride 0.9 % 100 mL IVPB        (MAR Hold since Wed 03/31/2020 at 0923.Hold Reason: Transfer to a Procedural area.)   2 g 200 mL/hr over 30 Minutes Intravenous Every 24 hours 03/30/20 0212     03/30/20 0215  [MAR Hold]  metroNIDAZOLE (FLAGYL) IVPB 500 mg        (MAR Hold since Wed 03/31/2020 at 0923.Hold Reason: Transfer to a Procedural area.)   500 mg 100 mL/hr over 60 Minutes Intravenous Every 8 hours 03/30/20 0212     03/30/20 0145  cefTRIAXone (ROCEPHIN) 2 g in sodium chloride 0.9 % 100 mL IVPB        2 g 200 mL/hr over 30 Minutes Intravenous  Once 03/30/20 0134 03/30/20 0318     Culture/Microbiology No results found for: SDES, SPECREQUEST, CULT, REPTSTATUS  Other culture-see note  Objective: Vitals: Today's Vitals   03/30/20 2329 03/31/20 0420 03/31/20 0924 03/31/20 0929  BP:  118/70 (!) 170/89   Pulse:  76 91   Resp:  16 16   Temp:  97.8 F (36.6 C) 98.1 F (36.7 C)   TempSrc:  Oral Oral   SpO2:  96% 98%   Weight:      Height:      PainSc: Asleep   0-No pain   No intake or output data in the 24 hours ending 03/31/20 1349 Filed Weights   03/29/20 1728 03/30/20 0422  Weight: 65.8 kg 62.7 kg   Weight change:   Intake/Output from previous day: 03/08 0701 - 03/09 0700 In: 564.9 [I.V.:421.3; IV Piggyback:143.6] Out: -  Intake/Output this shift: No intake/output data recorded. Filed Weights   03/29/20 1728 03/30/20 0422  Weight: 65.8 kg 62.7 kg    Examination: General exam: AAOx3 ,NAD, elderly, weak appearing. HEENT:Oral mucosa moist, Ear/Nose WNL grossly,dentition normal. Respiratory system: bilaterally  diminished,no use of accessory muscle, non tender. Cardiovascular system: S1 & S2 +, regular, No JVD. Gastrointestinal system: Abdomen soft, mildly tender RUQ, rt groin swollen/firm mass. Nervous System:Alert, awake, moving extremities and grossly nonfocal Extremities: No edema, distal peripheral pulses palpable.  Skin: No rashes,no icterus. MSK: Normal muscle bulk,tone, power  Data Reviewed: I have personally reviewed following labs and imaging studies CBC: Recent Labs  Lab 03/29/20 1740 03/30/20 0555 03/31/20 0535  WBC 12.6* 14.7* 9.8  HGB 11.5* 10.0* 9.4*  HCT 35.2* 31.1* 30.3*  MCV 93.4 94.8 96.2  PLT 383 344 875   Basic Metabolic Panel: Recent Labs  Lab 03/29/20 1740 03/30/20 0555 03/31/20 0535  NA 138 137  139  K 3.0* 3.3* 3.3*  CL 101 104 110  CO2 23 24 20*  GLUCOSE 207* 149* 93  BUN 27* 24* 20  CREATININE 1.18 1.03 0.92  CALCIUM 8.7* 8.0* 8.0*  MG  --  2.0  --   PHOS  --  3.4  --    GFR: Estimated Creatinine Clearance: 54.8 mL/min (by C-G formula based on SCr of 0.92 mg/dL). Liver Function Tests: Recent Labs  Lab 03/29/20 1740 03/30/20 0555 03/31/20 0535  AST 683* 276* 66*  ALT 300* 269* 157*  ALKPHOS 454* 408* 355*  BILITOT 3.1* 4.3* 4.5*  PROT 7.0 6.0* 5.6*  ALBUMIN 3.7 3.2* 2.8*   Recent Labs  Lab 03/29/20 1740  LIPASE 191*   No results for input(s): AMMONIA in the last 168 hours. Coagulation Profile: Recent Labs  Lab 03/30/20 0555  INR 1.2   Cardiac Enzymes: No results for input(s): CKTOTAL, CKMB, CKMBINDEX, TROPONINI in the last 168 hours. BNP (last 3 results) No results for input(s): PROBNP in the last 8760 hours. HbA1C: No results for input(s): HGBA1C in the last 72 hours. CBG: Recent Labs  Lab 03/30/20 1631 03/30/20 2149 03/31/20 0720 03/31/20 0929 03/31/20 1237  GLUCAP 114* 102* 77 73 79   Lipid Profile: No results for input(s): CHOL, HDL, LDLCALC, TRIG, CHOLHDL, LDLDIRECT in the last 72 hours. Thyroid Function  Tests: No results for input(s): TSH, T4TOTAL, FREET4, T3FREE, THYROIDAB in the last 72 hours. Anemia Panel: No results for input(s): VITAMINB12, FOLATE, FERRITIN, TIBC, IRON, RETICCTPCT in the last 72 hours. Sepsis Labs: No results for input(s): PROCALCITON, LATICACIDVEN in the last 168 hours.  Recent Results (from the past 240 hour(s))  Resp Panel by RT-PCR (Flu A&B, Covid) Nasopharyngeal Swab     Status: None   Collection Time: 03/30/20 12:36 AM   Specimen: Nasopharyngeal Swab; Nasopharyngeal(NP) swabs in vial transport medium  Result Value Ref Range Status   SARS Coronavirus 2 by RT PCR NEGATIVE NEGATIVE Final    Comment: (NOTE) SARS-CoV-2 target nucleic acids are NOT DETECTED.  The SARS-CoV-2 RNA is generally detectable in upper respiratory specimens during the acute phase of infection. The lowest concentration of SARS-CoV-2 viral copies this assay can detect is 138 copies/mL. A negative result does not preclude SARS-Cov-2 infection and should not be used as the sole basis for treatment or other patient management decisions. A negative result may occur with  improper specimen collection/handling, submission of specimen other than nasopharyngeal swab, presence of viral mutation(s) within the areas targeted by this assay, and inadequate number of viral copies(<138 copies/mL). A negative result must be combined with clinical observations, patient history, and epidemiological information. The expected result is Negative.  Fact Sheet for Patients:  EntrepreneurPulse.com.au  Fact Sheet for Healthcare Providers:  IncredibleEmployment.be  This test is no t yet approved or cleared by the [place] FDA and  has been authorized for detection and/or diagnosis of SARS-CoV-2 by FDA under an Emergency Use Authorization (EUA). This EUA will remain  in effect (meaning this test can be used) for the duration of the COVID-19 declaration under Section  564(b)(1) of the Act, 21 U.S.C.section 360bbb-3(b)(1), unless the authorization is terminated  or revoked sooner.       Influenza A by PCR NEGATIVE NEGATIVE Final   Influenza B by PCR NEGATIVE NEGATIVE Final    Comment: (NOTE) The Xpert Xpress SARS-CoV-2/FLU/RSV plus assay is intended as an aid in the diagnosis of influenza from Nasopharyngeal swab specimens and should not be used as  a sole basis for treatment. Nasal washings and aspirates are unacceptable for Xpert Xpress SARS-CoV-2/FLU/RSV testing.  Fact Sheet for Patients: EntrepreneurPulse.com.au  Fact Sheet for Healthcare Providers: IncredibleEmployment.be  This test is not yet approved or cleared by the [place] FDA and has been authorized for detection and/or diagnosis of SARS-CoV-2 by FDA under an Emergency Use Authorization (EUA). This EUA will remain in effect (meaning this test can be used) for the duration of the COVID-19 declaration under Section 564(b)(1) of the Act, 21 U.S.C. section 360bbb-3(b)(1), unless the authorization is terminated or revoked.  Performed at [facility], [place] [address] [place], [place] [address]      Radiology Studies: NM Hepatobiliary Liver Func  Result Date: 03/30/2020 CLINICAL DATA:  Abdominal pain. RIGHT upper quadrant pain with vomiting. Gallstones on ultrasound imaging. Elevated bilirubin. EXAM: NUCLEAR MEDICINE HEPATOBILIARY IMAGING TECHNIQUE: Sequential images of the abdomen were obtained out to 60 minutes following intravenous administration of radiopharmaceutical. RADIOPHARMACEUTICALS:  7.3 mCi Tc-36m  Choletec IV COMPARISON:  CT 03/29/2020, ultrasound 03/30/2020 FINDINGS: Prompt clearance radiotracer from blood pool and homogeneous uptake liver. Imaging performed for 90 minutes. There is no evidence transit radiotracer bile into the common bile duct or intestine. The gallbladder does not fill. IMPRESSION: 1. No excretion  radiotracer bile into the common bile duct or proximal small bowel. Findings are indicative of hepatitis versus bile duct obstruction. In patient with multiple gallstones and elevated bilirubin and acute abdominal pain, favor common bile duct obstruction secondary to choledocholithiasis 2. Patency of the cystic duct cannot be confirmed. Non filling of the gallbladder could indicate cholecystitis versus non filling of the gallbladder related to hydrostatic pressure from common bile duct obstruction. These results will be called to the ordering clinician or representative by the Radiologist Assistant, and communication documented in the PACS or [employer]. Electronically Signed   By: Suzy Bouchard M.D.   On: 03/30/2020 12:43   CT ABDOMEN PELVIS W CONTRAST  Addendum Date: 03/30/2020   ADDENDUM REPORT: 03/30/2020 09:34 ADDENDUM: After conferring with referring physician Dr. Johney Maine, the large intermediate density collection within the RIGHT inguinal canal is favored to represent a postsurgical hematoma related to reduction and repair of large RIGHT scrotal hernia. Patient was seen 1 day prior in clinic with no significant symptoms. Findings discussed toDr.  Johney Maine on 03/30/2020  at09:00. Electronically Signed   By: Suzy Bouchard M.D.   On: 03/30/2020 09:34   Result Date: 03/30/2020 CLINICAL DATA:  Epigastric pain since 3 a.m., vomiting EXAM: CT ABDOMEN AND PELVIS WITH CONTRAST TECHNIQUE: Multidetector CT imaging of the abdomen and pelvis was performed using the standard protocol following bolus administration of intravenous contrast. CONTRAST:  196mL OMNIPAQUE IOHEXOL 300 MG/ML  SOLN COMPARISON:  At 2613 FINDINGS: Lower chest: No acute pleural or parenchymal lung disease. Hepatobiliary: No focal liver abnormality is seen. No gallstones, gallbladder wall thickening, or biliary dilatation. Pancreas: Unremarkable. No pancreatic ductal dilatation or surrounding inflammatory changes. Spleen: Normal in size without  focal abnormality. Adrenals/Urinary Tract: Numerous nonobstructing bilateral renal calculi are identified, measuring up to 5 mm on the right and 4 mm on the left. No evidence of obstructive uropathy within either kidney. Progressive areas of bilateral renal cortical scarring and thinning. The adrenals and bladder are unremarkable. Stomach/Bowel: No bowel obstruction or ileus. Diverticulosis of the distal colon with no evidence of acute diverticulitis. Normal appendix right lower quadrant. Postsurgical changes from previous small bowel resection. Vascular/Lymphatic: Aortic atherosclerosis. No enlarged abdominal or pelvic lymph nodes. Reproductive:  Prostate is mildly enlarged measuring 4.8 x 4.2 cm. Other: There is a complex fluid collection within the right inguinal canal extending into the right hemiscrotum, measuring approximately 8.6 x 7.4 x 10.6 cm in size. Overall this likely reflects a right inguinal hernia containing mesenteric fat and fluid, with concerns for an incarcerated hernia. No bowel herniation is identified. No free intraperitoneal fluid or free gas. No other abdominal wall hernia. Musculoskeletal: Chronic appearing L1 compression fracture. No other acute bony abnormalities. Reconstructed images demonstrate no additional findings. IMPRESSION: 1. Large incarcerated fat containing right inguinal hernia, with significant fluid in the hernia sac. No bowel herniation. 2. Bilateral nonobstructing renal calculi, with significant bilateral renal cortical scarring and atrophy. 3. Sigmoid diverticulosis without diverticulitis. 4.  Aortic Atherosclerosis (ICD10-I70.0). Electronically Signed: By: Randa Ngo M.D. On: 03/29/2020 23:05   MR ABDOMEN MRCP W WO CONTAST  Result Date: 03/30/2020 CLINICAL DATA:  Right upper quadrant pain, nondiagnostic ultrasound EXAM: MRI ABDOMEN WITHOUT AND WITH CONTRAST (INCLUDING MRCP) TECHNIQUE: Multiplanar multisequence MR imaging of the abdomen was performed both before and  after the administration of intravenous contrast. Heavily T2-weighted images of the biliary and pancreatic ducts were obtained, and three-dimensional MRCP images were rendered by post processing. CONTRAST:  98mL GADAVIST GADOBUTROL 1 MMOL/ML IV SOLN COMPARISON:  HIDA and ultrasound 03/30/2020, CT 03/29/2020 FINDINGS: Lower chest: Small bilateral pleural effusions with some adjacent passive atelectasis. Included mediastinum is unremarkable. Hepatobiliary: Normal intrinsic liver signal. Smooth surface contour. No focal liver lesion on multiphase imaging. Mild gallbladder wall thickening and faint enhancement. Gallbladder contains multiple filling defects and biliary sludge. Few gallstones are seen in the gallbladder neck and cystic duct. No distal common bile duct stones are seen though there is some notable signal dropout at the level of the head of the pancreas secondary to a large air-filled duodenal diverticulum better seen on comparison CT. Pancreas: Partial fatty replacement of the pancreas. No concerning focal pancreatic lesion is seen. No pancreatic ductal dilatation or peripancreatic inflammation. Signal dropout of the head of the pancreas compatible with a duodenal diverticulum seen on comparison CT. Spleen: Few tiny T2 hyperintense and T1 hypointense subcentimeter foci are present within the spleen without worrisome postcontrast enhancement or other aggressive features, likely benign cyst. Normal splenic size. Adrenals/Urinary Tract: No concerning adrenal mass or lesion. Kidneys are symmetric in size and normally located. Cortical scarring is seen bilaterally. Some scattered parapelvic and cortical cysts are present in both kidneys. No concerning focal renal lesion. Mild T2 hyperintense perinephric stranding is nonspecific but can be seen with diminished renal function or advanced age. 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 No hydronephrosis. Stomach/Bowel: Distal esophagus, stomach and  duodenum are unremarkable. Air-filled diverticulum with signal dropout at the level of the pancreatic head again measuring up to 2.8 cm in size. Several clustered loops of bowel in the right hemiabdomen are likely related to postsurgical mobilization. No concerning thickening or dilatation of the visible loops of large or small bowel. Scattered colonic diverticula without focal inflammation to suggest diverticulitis. Vascular/Lymphatic: Atherosclerotic plaque of the abdominal aorta and branch vessels. No worrisome aneurysm or ectasia. No concerning adenopathy. Other: No visible abdominal free fluid or ascites. No bowel containing hernia seen within the margins of imaging. Musculoskeletal: Multilevel discogenic and facet degenerative changes in the spine. Compression deformity at L1 is similar to prior, difficult to fully assess on this exam. Probable hemangioma T12. Normal cord signal. IMPRESSION: 1. Cholelithiasis with mild gallbladder wall thickening and faint enhancement. Findings are compatible with  acute cholecystitis in the appropriate clinical setting. 2. Few small gallstones present in the gallbladder neck and proximal cystic duct. No visible common bile duct stones are seen though there is signal dropout at the level of the head of the pancreas secondary to a large air-filled duodenal diverticulum better seen on comparison CT. 3. Mild bilateral perinephric stranding, can be seen with advanced age or diminished renal function though should correlate for urinary symptoms to exclude urinary tract infection. 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 4. Compression deformity at L1 is similar to prior exam, difficult to fully assess on this exam. Electronically Signed   By: Lovena Le M.D.   On: 03/30/2020 19:22   US Abdomen Limited RUQ (LIVER/GB)  Result Date: 03/30/2020 CLINICAL DATA:  Elevated liver function tests EXAM: ULTRASOUND ABDOMEN LIMITED RIGHT UPPER QUADRANT COMPARISON:  None. FINDINGS:  Gallbladder: The gallbladder contains multiple stones measuring up to 12 mm in greatest diameter as well as layering sludge and there is moderate gallbladder wall thickening noted. The gallbladder, however, is not distended, there is no pericholecystic fluid identified, and the sonographic Percell [name] sign is reportedly negative. Common bile duct: Diameter: 2-3 mm Liver: No focal lesion identified. Within normal limits in parenchymal echogenicity. Portal vein is patent on color Doppler imaging with normal direction of blood flow towards the liver. Other: No ascites IMPRESSION: Cholelithiasis with gallbladder wall thickening suggesting changes of chronic cholecystitis in the appropriate clinical setting. Electronically Signed   By: Fidela Salisbury MD   On: 03/30/2020 00:25     LOS: 1 day   Antonieta Pert, MD [facility]  03/31/2020, 1:49 PM

## 2020-03-31 NOTE — Op Note (Signed)
Belia Heman  Primary Care Physician:  Leeroy Cha, MD    03/31/2020  5:39 PM  Procedure: Laparoscopic Cholecystectomy with intraoperative cholangiogram  Surgeon: Catalina Antigua B. Hassell Done, MD, FACS Asst:  none  Anes:  General  Drains:  None  Findings: Friable chronic cholecystitis  Description of Procedure: The patient was taken to OR 4 and given general anesthesia.  The patient was prepped with chlorohexidine prep and draped sterilely. A time out was performed including identifying the patient and discussing their procedure.  Access to the abdomen was achieved with a 5 mm Optiview through the right upper quadrant.  Port placement included three 5 mm trocars and one 11 mm trocar.    The gallbladder was visualized and appeared white, thick and friable with fat. .   The fundus of the gallbaldder was grasped and the gallbladder was elevated. Traction on the infundibulum allowed for successful demonstration of the critical view. Inflammatory changes were acute with edema and chronic.  The cystic duct was identified and clipped up on the gallbladder and an incision was made in the cystic duct and the Reddick catheter was inserted after milking the cystic duct of any debris. A dynamic cholangiogram was performed which demonstrated retrofilling of the gallbladder and some extravasation.  The cystic duct appeared to be chronically occluded and after critical view was check the cystic duct and artery were clipped and divided.    Removal of the gallbladder from the gallbladder bed was performed without entering it.  The gallbladder was then placed in a bag and brought out through one of the trocar sites. The gallbladder bed was inspected and no bleeding or bile leaks were seen.   Laparoscopic visualization was used when closing fascial defects for trocar sites.   Incisions were injected with Exparel and because the upper port had very little subcut, the fascia (such as it was) was approximated with  a running 2-0 prolene and closed with 4-0 Monocryl and Dermabond on the skin.  Sponge and needle count were correct.    The patient was taken to the recovery room in satisfactory condition.

## 2020-04-01 ENCOUNTER — Encounter (HOSPITAL_COMMUNITY): Payer: Self-pay | Admitting: Surgery

## 2020-04-01 DIAGNOSIS — K7589 Other specified inflammatory liver diseases: Secondary | ICD-10-CM | POA: Diagnosis not present

## 2020-04-01 LAB — BASIC METABOLIC PANEL
Anion gap: 13 (ref 5–15)
BUN: 24 mg/dL — ABNORMAL HIGH (ref 8–23)
CO2: 18 mmol/L — ABNORMAL LOW (ref 22–32)
Calcium: 8.2 mg/dL — ABNORMAL LOW (ref 8.9–10.3)
Chloride: 106 mmol/L (ref 98–111)
Creatinine, Ser: 1.06 mg/dL (ref 0.61–1.24)
GFR, Estimated: >60 mL/min (ref >60)
Glucose, Bld: 164 mg/dL — ABNORMAL HIGH (ref 70–99)
Potassium: 3.9 mmol/L (ref 3.5–5.1)
Sodium: 137 mmol/L (ref 135–145)

## 2020-04-01 LAB — CBC
HCT: 34.4 % — ABNORMAL LOW (ref 39.0–52.0)
Hemoglobin: 10.7 g/dL — ABNORMAL LOW (ref 13.0–17.0)
MCH: 29.8 pg (ref 26.0–34.0)
MCHC: 31.1 g/dL (ref 30.0–36.0)
MCV: 95.8 fL (ref 80.0–100.0)
Platelets: 349 10*3/uL (ref 150–400)
RBC: 3.59 MIL/uL — ABNORMAL LOW (ref 4.22–5.81)
RDW: 13.4 % (ref 11.5–15.5)
WBC: 8 10*3/uL (ref 4.0–10.5)
nRBC: 0 % (ref 0.0–0.2)

## 2020-04-01 LAB — GLUCOSE, CAPILLARY: Glucose-Capillary: 132 mg/dL — ABNORMAL HIGH (ref 70–99)

## 2020-04-01 LAB — HEPATIC FUNCTION PANEL
ALT: 118 U/L — ABNORMAL HIGH (ref 0–44)
AST: 43 U/L — ABNORMAL HIGH (ref 15–41)
Albumin: 2.9 g/dL — ABNORMAL LOW (ref 3.5–5.0)
Alkaline Phosphatase: 387 U/L — ABNORMAL HIGH (ref 38–126)
Bilirubin, Direct: 1.1 mg/dL — ABNORMAL HIGH (ref 0.0–0.2)
Indirect Bilirubin: 1.3 mg/dL — ABNORMAL HIGH (ref 0.3–0.9)
Total Bilirubin: 2.4 mg/dL — ABNORMAL HIGH (ref 0.3–1.2)
Total Protein: 6 g/dL — ABNORMAL LOW (ref 6.5–8.1)

## 2020-04-01 MED ORDER — CALCIUM POLYCARBOPHIL 625 MG PO TABS
625.0000 mg | ORAL_TABLET | Freq: Two times a day (BID) | ORAL | Status: DC
  Administered 2020-04-01: 625 mg via ORAL
  Filled 2020-04-01: qty 1

## 2020-04-01 MED ORDER — PROCHLORPERAZINE MALEATE 10 MG PO TABS: 5.0000 mg | ORAL_TABLET | Freq: Four times a day (QID) | ORAL | Status: DC | PRN

## 2020-04-01 MED ORDER — MAGIC MOUTHWASH
15.0000 mL | Freq: Four times a day (QID) | ORAL | Status: DC | PRN
  Filled 2020-04-01: qty 15

## 2020-04-01 MED ORDER — HYDROCORTISONE (PERIANAL) 2.5 % EX CREA: 1.0000 "application " | TOPICAL_CREAM | Freq: Four times a day (QID) | CUTANEOUS | Status: DC | PRN

## 2020-04-01 MED ORDER — PHENOL 1.4 % MT LIQD
1.0000 | OROMUCOSAL | Status: DC | PRN
Start: 2020-04-01 — End: 2020-04-01
  Filled 2020-04-01: qty 177

## 2020-04-01 MED ORDER — SODIUM CHLORIDE 0.9% FLUSH: 3.0000 mL | INTRAVENOUS | Status: DC | PRN

## 2020-04-01 MED ORDER — CALCIUM POLYCARBOPHIL 625 MG PO TABS: 625.0000 mg | ORAL_TABLET | Freq: Two times a day (BID) | ORAL | 0 refills | Status: AC

## 2020-04-01 MED ORDER — GUAIFENESIN-DM 100-10 MG/5ML PO SYRP: 10.0000 mL | ORAL_SOLUTION | ORAL | Status: DC | PRN

## 2020-04-01 MED ORDER — ALUM & MAG HYDROXIDE-SIMETH 200-200-20 MG/5ML PO SUSP: 30.0000 mL | Freq: Four times a day (QID) | ORAL | Status: DC | PRN

## 2020-04-01 MED ORDER — SODIUM CHLORIDE 0.9 % IV SOLN
8.0000 mg | Freq: Four times a day (QID) | INTRAVENOUS | Status: DC | PRN
  Filled 2020-04-01: qty 4

## 2020-04-01 MED ORDER — METHOCARBAMOL 500 MG PO TABS: 1000.0000 mg | ORAL_TABLET | Freq: Four times a day (QID) | ORAL | Status: DC | PRN

## 2020-04-01 MED ORDER — SODIUM CHLORIDE 0.9 % IV SOLN: 250.0000 mL | INTRAVENOUS | Status: DC | PRN

## 2020-04-01 MED ORDER — ACETAMINOPHEN 500 MG PO TABS: 1000.0000 mg | ORAL_TABLET | Freq: Four times a day (QID) | ORAL | Status: DC

## 2020-04-01 MED ORDER — ONDANSETRON HCL 4 MG/2ML IJ SOLN: 4.0000 mg | Freq: Four times a day (QID) | INTRAMUSCULAR | Status: DC | PRN

## 2020-04-01 MED ORDER — MENTHOL 3 MG MT LOZG: 1.0000 | LOZENGE | OROMUCOSAL | Status: DC | PRN

## 2020-04-01 MED ORDER — LACTATED RINGERS IV BOLUS: 1000.0000 mL | Freq: Three times a day (TID) | INTRAVENOUS | Status: DC | PRN

## 2020-04-01 MED ORDER — LIP MEDEX EX OINT
1.0000 "application " | TOPICAL_OINTMENT | Freq: Two times a day (BID) | CUTANEOUS | Status: DC
  Administered 2020-04-01: 1 via TOPICAL
  Filled 2020-04-01: qty 7

## 2020-04-01 MED ORDER — SODIUM CHLORIDE 0.9% FLUSH: 3.0000 mL | Freq: Two times a day (BID) | INTRAVENOUS | Status: DC

## 2020-04-01 MED ORDER — HYDROCORTISONE 1 % EX CREA: 1.0000 "application " | TOPICAL_CREAM | Freq: Three times a day (TID) | CUTANEOUS | Status: DC | PRN

## 2020-04-01 MED ORDER — PIPERACILLIN-TAZOBACTAM 3.375 G IVPB
3.3750 g | Freq: Three times a day (TID) | INTRAVENOUS | Status: DC
  Administered 2020-04-01: 3.375 g via INTRAVENOUS
  Filled 2020-04-01 (×2): qty 50

## 2020-04-01 NOTE — Discharge Summary (Signed)
Physician Discharge Summary  Antonio [name] LPF:[id number] DOB: 1940-04-13 DOA: 03/29/2020  PCP: Leeroy Cha, MD  Admit date: 03/29/2020 Discharge date: 04/01/2020  Admitted From: home Disposition:  home  Recommendations for Outpatient Follow-up:  1. Follow up with PCP in 1-2 weeks 2. Please obtain BMP/CBC in one week  Home Health:no  Equipment/Devices: none  Discharge Condition: Stable Code Status:   Code Status: DNR Diet recommendation:  Diet Order            DIET SOFT Room service appropriate? Yes; Fluid consistency: Thin  Diet effective now                  Brief/Interim Summary: 80 year old male with significant history of OSA on CPAP, hyperlipidemia, hypertension, diabetes, BPH, chronic back pain, recent hernia repair 03/11/2020 presents with abdominal pain. As per repot developed real bad pain right at the xiphoid region -went away and he ate something but it came back "double  at 0300 3/7 AM after having eaten a good supper and at [employer].  In the ED right upper quadrant ultrasound suggestive of obstructive cholecystitis, CT with probable postsurgical hematoma, HIDA scan was performed showed bile duct obstruction from choledocholithiasis with abnormal LFTs GI was consulted, surgery was consulted was ordered MRCP, kept n.p.o. and was admitted overnight at empiric antibiotics and pain control MRCP came back later showing cholelithiasis with findings history of acute cholecystitis , 3 small gallstone in the gallbladder neck and proximal cystic duct, no visible stone on CBD, mild bilateral perinephric stranding compression deformity Patient was reassessed by surgery 3/9 a.m. and plan for for cholecystectomy Underwent laparoscopic cholecystectomy with IOC 3/9 by Dr. Hassell Done Monitor next day Today tolerated diet ambulating, would like to go home Pain is controlled.  Discharge Diagnoses:   Abdominal pain nausea vomiting Abnormal LFTs Acute cholecystitis Patient  was seen by GI and surgery underwent extensive work-up with CT abdomen, right upper quadrant ultrasound, HIDA scan and eventually MRCP that showed acute cholecystitis  Status post cholecystectomy postop did well tolerating diet ambulating well pain is controlled. Is being discharged home today after lunch as he is cleared by surgery.  Recent hernia repair- Scrotal hernia - right, s/p lap repair w mesh 03/11/2020. Inguinal hernia, left, s/p lap repair w mesh 03/11/2020: healing. Rt groin swelling likely postop hematoma contributing to the elevated bilirubin.  Will follow up with surgery as outpatient.   Hypokalemia replete and monitor  OSA on cpap  HTN-resume home meds  Hyperlipidemia-follow-up with PCP  DM: hba1c 7.5. sugar stable no home meds  Chronic kidney disease, stage 3a:stable follow-up with.  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  Wants to go home today.  Seen by surgery.  Discharge Exam: Vitals:   04/01/20 0259 04/01/20 0458  BP: 124/61 120/65  Pulse: 77 71  Resp:  14  Temp:  98.4 F (36.9 C)  SpO2:  98%   General: Pt is alert, awake, not in acute distress Cardiovascular: RRR, S1/S2 +, no rubs, no gallops Respiratory: CTA bilaterally, no wheezing, no rhonchi Abdominal: Soft, NT, ND, bowel sounds + Extremities: no edema, no cyanosis  Discharge Instructions  Discharge Instructions    Discharge instructions   Complete by: As directed    Please call call MD or return to ER for  similar or worsening recurring problem that brought you to hospital or if any fever,nausea/vomiting,abdominal pain, uncontrolled pain, chest pain,  shortness of breath or any other alarming symptoms.  Please follow-up your doctor primary and surgery as  instructed in a week time and call the office for appointment.  Please avoid alcohol, smoking, or any other illicit substance and maintain healthy habits including taking your regular medications as prescribed.  You were cared for by a hospitalist during your hospital stay. If you have any questions about your discharge medications or the care you received while you were in the hospital after you are discharged, you can call the unit and ask to speak with the hospitalist on call if the hospitalist that took care of you is not available.  Once you are discharged, your primary care physician will handle any further medical issues. Please note that NO REFILLS for any discharge medications will be authorized once you are discharged, as it is imperative that you return to your primary care physician (or establish a relationship with a primary care physician if you do not have one) for your aftercare needs so that they can reassess your need for medications and monitor your lab values   If the dressing is still on your incision site when you go home, remove it on the third day after your surgery date. Remove dressing if it begins to fall off, or if it is dirty or damaged before the third day.   Complete by: As directed    Increase activity slowly   Complete by: As directed      Allergies as of 04/01/2020      Reactions   Metformin And Related Diarrhea   Wellbutrin [bupropion] Other (See Comments)   Mental fogginess per patient   Zoloft [sertraline] Other (See Comments)   Mental fogginess per patient      Medication List    STOP taking these medications   hydrochlorothiazide 25 MG tablet Commonly known as: HYDRODIURIL     TAKE these medications   cholecalciferol 1000 units tablet Commonly known as: VITAMIN D Take 1,000 Units by mouth in the morning and at bedtime.   ELDERBERRY PO Take 2 each by mouth daily. Gummies   empagliflozin 25 MG Tabs tablet Commonly known as: Jardiance Take 1  tablet (25 mg total) by mouth daily before breakfast.   famotidine 10 MG tablet Commonly known as: PEPCID Take 10 mg by mouth daily.   ferrous sulfate 325 (65 FE) MG tablet Take 325 mg by mouth daily with breakfast.   lamoTRIgine 25 MG tablet Commonly known as: LAMICTAL TAKE 1 TABLET BY MOUTH  DAILY   polycarbophil 625 MG tablet Commonly known as: FIBERCON Take 1 tablet (625 mg total) by mouth 2 (two) times daily for 7 days.   potassium chloride SA 20 MEQ tablet Commonly known as: KLOR-CON Take 20 mEq by mouth daily.   tamsulosin 0.4 MG Caps capsule Commonly known as: FLOMAX Take 1 capsule (0.4 mg total) by mouth daily after breakfast.   traMADol 50 MG tablet Commonly known as: ULTRAM Take 1-2 tablets (50-100 mg total) by mouth every 6 (six) hours as needed for moderate pain or severe pain.            Discharge Care Instructions  (From admission, onward)         Start     Ordered   04/01/20 0000  If the dressing is still on your incision site when you go home, remove it  on the third day after your surgery date. Remove dressing if it begins to fall off, or if it is dirty or damaged before the third day.        04/01/20 1039          Follow-up Information    Sur[facility], [place] Follow up.   Specialty: General Surgery Why: our office is scheduling you for post-operative follow up in 2-3 weeks. please call to confirm appointment date/time.  Contact information: [address] [place] [address] [phone]              Allergies  Allergen Reactions  . Metformin And Related Diarrhea  . Wellbutrin [Bupropion] Other (See Comments)    Mental fogginess per patient  . Zoloft [Sertraline] Other (See Comments)    Mental fogginess per patient    The results of significant diagnostics from this hospitalization (including imaging, microbiology, ancillary and laboratory) are listed below for reference.    Microbiology: Recent Results (from  the past 240 hour(s))  Resp Panel by RT-PCR (Flu A&B, Covid) Nasopharyngeal Swab     Status: None   Collection Time: 03/30/20 12:36 AM   Specimen: Nasopharyngeal Swab; Nasopharyngeal(NP) swabs in vial transport medium  Result Value Ref Range Status   SARS Coronavirus 2 by RT PCR NEGATIVE NEGATIVE Final    Comment: (NOTE) SARS-CoV-2 target nucleic acids are NOT DETECTED.  The SARS-CoV-2 RNA is generally detectable in upper respiratory specimens during the acute phase of infection. The lowest concentration of SARS-CoV-2 viral copies this assay can detect is 138 copies/mL. A negative result does not preclude SARS-Cov-2 infection and should not be used as the sole basis for treatment or other patient management decisions. A negative result may occur with  improper specimen collection/handling, submission of specimen other than nasopharyngeal swab, presence of viral mutation(s) within the areas targeted by this assay, and inadequate number of viral copies(<138 copies/mL). A negative result must be combined with clinical observations, patient history, and epidemiological information. The expected result is Negative.  Fact Sheet for Patients:  EntrepreneurPulse.com.au  Fact Sheet for Healthcare Providers:  IncredibleEmployment.be  This test is no t yet approved or cleared by the [place] FDA and  has been authorized for detection and/or diagnosis of SARS-CoV-2 by FDA under an Emergency Use Authorization (EUA). This EUA will remain  in effect (meaning this test can be used) for the duration of the COVID-19 declaration under Section 564(b)(1) of the Act, 21 U.S.C.section 360bbb-3(b)(1), unless the authorization is terminated  or revoked sooner.       Influenza A by PCR NEGATIVE NEGATIVE Final   Influenza B by PCR NEGATIVE NEGATIVE Final    Comment: (NOTE) The Xpert Xpress SARS-CoV-2/FLU/RSV plus assay is intended as an aid in the diagnosis of  influenza from Nasopharyngeal swab specimens and should not be used as a sole basis for treatment. Nasal washings and aspirates are unacceptable for Xpert Xpress SARS-CoV-2/FLU/RSV testing.  Fact Sheet for Patients: EntrepreneurPulse.com.au  Fact Sheet for Healthcare Providers: IncredibleEmployment.be  This test is not yet approved or cleared by the [place] FDA and has been authorized for detection and/or diagnosis of SARS-CoV-2 by FDA under an Emergency Use Authorization (EUA). This EUA will remain in effect (meaning this test can be used) for the duration of the COVID-19 declaration under Section 564(b)(1) of the Act, 21 U.S.C. section 360bbb-3(b)(1), unless the authorization is terminated or revoked.  Performed at [facility], [address] [place], [place]  [address]     Procedures/Studies: DG Cholangiogram Operative  Result Date: 03/31/2020 CLINICAL DATA:  Known cholelithiasis and cholecystectomy EXAM: INTRAOPERATIVE CHOLANGIOGRAM TECHNIQUE: Cholangiographic images from the C-arm fluoroscopic device were submitted for interpretation post-operatively. Please see the procedural report for the amount of contrast and the fluoroscopy time utilized. COMPARISON:  MRI from the previous day. FINDINGS: 42 seconds of fluoroscopy was utilized. Multiple gland geographic runs were obtained. Mild visualization of the intrahepatic and extrahepatic biliary tree is noted. No definitive filling defects are seen although the degree of ductal opacification was limited. IMPRESSION: No definitive retained stones although opacification of the ductal system was limited. Electronically Signed   By: Inez Catalina M.D.   On: 03/31/2020 17:58   NM Hepatobiliary Liver Func  Result Date: 03/30/2020 CLINICAL DATA:  Abdominal pain. RIGHT upper quadrant pain with vomiting. Gallstones on ultrasound imaging. Elevated bilirubin. EXAM: NUCLEAR MEDICINE  HEPATOBILIARY IMAGING TECHNIQUE: Sequential images of the abdomen were obtained out to 60 minutes following intravenous administration of radiopharmaceutical. RADIOPHARMACEUTICALS:  7.3 mCi Tc-29m  Choletec IV COMPARISON:  CT 03/29/2020, ultrasound 03/30/2020 FINDINGS: Prompt clearance radiotracer from blood pool and homogeneous uptake liver. Imaging performed for 90 minutes. There is no evidence transit radiotracer bile into the common bile duct or intestine. The gallbladder does not fill. IMPRESSION: 1. No excretion radiotracer bile into the common bile duct or proximal small bowel. Findings are indicative of hepatitis versus bile duct obstruction. In patient with multiple gallstones and elevated bilirubin and acute abdominal pain, favor common bile duct obstruction secondary to choledocholithiasis 2. Patency of the cystic duct cannot be confirmed. Non filling of the gallbladder could indicate cholecystitis versus non filling of the gallbladder related to hydrostatic pressure from common bile duct obstruction. These results will be called to the ordering clinician or representative by the Radiologist Assistant, and communication documented in the PACS or [employer]. Electronically Signed   By: Suzy Bouchard M.D.   On: 03/30/2020 12:43   CT ABDOMEN PELVIS W CONTRAST  Addendum Date: 03/30/2020   ADDENDUM REPORT: 03/30/2020 09:34 ADDENDUM: After conferring with referring physician Dr. Johney Maine, the large intermediate density collection within the RIGHT inguinal canal is favored to represent a postsurgical hematoma related to reduction and repair of large RIGHT scrotal hernia. Patient was seen 1 day prior in clinic with no significant symptoms. Findings discussed toDr.  Johney Maine on 03/30/2020  at09:00. Electronically Signed   By: Suzy Bouchard M.D.   On: 03/30/2020 09:34   Result Date: 03/30/2020 CLINICAL DATA:  Epigastric pain since 3 a.m., vomiting EXAM: CT ABDOMEN AND PELVIS WITH CONTRAST TECHNIQUE:  Multidetector CT imaging of the abdomen and pelvis was performed using the standard protocol following bolus administration of intravenous contrast. CONTRAST:  1108mL OMNIPAQUE IOHEXOL 300 MG/ML  SOLN COMPARISON:  At 2613 FINDINGS: Lower chest: No acute pleural or parenchymal lung disease. Hepatobiliary: No focal liver abnormality is seen. No gallstones, gallbladder wall thickening, or biliary dilatation. Pancreas: Unremarkable. No pancreatic ductal dilatation or surrounding inflammatory changes. Spleen: Normal in size without focal abnormality. Adrenals/Urinary Tract: Numerous nonobstructing bilateral renal calculi are identified, measuring up to 5 mm on the right and 4 mm on the left. No evidence of obstructive uropathy within either kidney. Progressive areas of bilateral renal cortical scarring and thinning. The adrenals and bladder are unremarkable. Stomach/Bowel: No bowel obstruction or ileus. Diverticulosis of the distal colon with no evidence of acute diverticulitis. Normal appendix right lower quadrant. Postsurgical changes from previous small bowel resection. Vascular/Lymphatic: Aortic atherosclerosis. No  enlarged abdominal or pelvic lymph nodes. Reproductive: Prostate is mildly enlarged measuring 4.8 x 4.2 cm. Other: There is a complex fluid collection within the right inguinal canal extending into the right hemiscrotum, measuring approximately 8.6 x 7.4 x 10.6 cm in size. Overall this likely reflects a right inguinal hernia containing mesenteric fat and fluid, with concerns for an incarcerated hernia. No bowel herniation is identified. No free intraperitoneal fluid or free gas. No other abdominal wall hernia. Musculoskeletal: Chronic appearing L1 compression fracture. No other acute bony abnormalities. Reconstructed images demonstrate no additional findings. IMPRESSION: 1. Large incarcerated fat containing right inguinal hernia, with significant fluid in the hernia sac. No bowel herniation. 2. Bilateral  nonobstructing renal calculi, with significant bilateral renal cortical scarring and atrophy. 3. Sigmoid diverticulosis without diverticulitis. 4.  Aortic Atherosclerosis (ICD10-I70.0). Electronically Signed: By: Randa Ngo M.D. On: 03/29/2020 23:05   MR ABDOMEN MRCP W WO CONTAST  Result Date: 03/30/2020 CLINICAL DATA:  Right upper quadrant pain, nondiagnostic ultrasound EXAM: MRI ABDOMEN WITHOUT AND WITH CONTRAST (INCLUDING MRCP) TECHNIQUE: Multiplanar multisequence MR imaging of the abdomen was performed both before and after the administration of intravenous contrast. Heavily T2-weighted images of the biliary and pancreatic ducts were obtained, and three-dimensional MRCP images were rendered by post processing. CONTRAST:  81mL GADAVIST GADOBUTROL 1 MMOL/ML IV SOLN COMPARISON:  HIDA and ultrasound 03/30/2020, CT 03/29/2020 FINDINGS: Lower chest: Small bilateral pleural effusions with some adjacent passive atelectasis. Included mediastinum is unremarkable. Hepatobiliary: Normal intrinsic liver signal. Smooth surface contour. No focal liver lesion on multiphase imaging. Mild gallbladder wall thickening and faint enhancement. Gallbladder contains multiple filling defects and biliary sludge. Few gallstones are seen in the gallbladder neck and cystic duct. No distal common bile duct stones are seen though there is some notable signal dropout at the level of the head of the pancreas secondary to a large air-filled duodenal diverticulum better seen on comparison CT. Pancreas: Partial fatty replacement of the pancreas. No concerning focal pancreatic lesion is seen. No pancreatic ductal dilatation or peripancreatic inflammation. Signal dropout of the head of the pancreas compatible with a duodenal diverticulum seen on comparison CT. Spleen: Few tiny T2 hyperintense and T1 hypointense subcentimeter foci are present within the spleen without worrisome postcontrast enhancement or other aggressive features, likely benign  cyst. Normal splenic size. Adrenals/Urinary Tract: No concerning adrenal mass or lesion. Kidneys are symmetric in size and normally located. Cortical scarring is seen bilaterally. Some scattered parapelvic and cortical cysts are present in both kidneys. No concerning focal renal lesion. Mild T2 hyperintense perinephric stranding is nonspecific but can be seen with diminished renal function or advanced age. 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 No hydronephrosis. Stomach/Bowel: Distal esophagus, stomach and duodenum are unremarkable. Air-filled diverticulum with signal dropout at the level of the pancreatic head again measuring up to 2.8 cm in size. Several clustered loops of bowel in the right hemiabdomen are likely related to postsurgical mobilization. No concerning thickening or dilatation of the visible loops of large or small bowel. Scattered colonic diverticula without focal inflammation to suggest diverticulitis. Vascular/Lymphatic: Atherosclerotic plaque of the abdominal aorta and branch vessels. No worrisome aneurysm or ectasia. No concerning adenopathy. Other: No visible abdominal free fluid or ascites. No bowel containing hernia seen within the margins of imaging. Musculoskeletal: Multilevel discogenic and facet degenerative changes in the spine. Compression deformity at L1 is similar to prior, difficult to fully assess on this exam. Probable hemangioma T12. Normal cord signal. IMPRESSION: 1. Cholelithiasis with mild gallbladder wall thickening  and faint enhancement. Findings are compatible with acute cholecystitis in the appropriate clinical setting. 2. Few small gallstones present in the gallbladder neck and proximal cystic duct. No visible common bile duct stones are seen though there is signal dropout at the level of the head of the pancreas secondary to a large air-filled duodenal diverticulum better seen on comparison CT. 3. Mild bilateral perinephric stranding, can be seen with  advanced age or diminished renal function though should correlate for urinary symptoms to exclude urinary tract infection. 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 4. Compression deformity at L1 is similar to prior exam, difficult to fully assess on this exam. Electronically Signed   By: Lovena Le M.D.   On: 03/30/2020 19:22   US Abdomen Limited RUQ (LIVER/GB)  Result Date: 03/30/2020 CLINICAL DATA:  Elevated liver function tests EXAM: ULTRASOUND ABDOMEN LIMITED RIGHT UPPER QUADRANT COMPARISON:  None. FINDINGS: Gallbladder: The gallbladder contains multiple stones measuring up to 12 mm in greatest diameter as well as layering sludge and there is moderate gallbladder wall thickening noted. The gallbladder, however, is not distended, there is no pericholecystic fluid identified, and the sonographic Percell [name] sign is reportedly negative. Common bile duct: Diameter: 2-3 mm Liver: No focal lesion identified. Within normal limits in parenchymal echogenicity. Portal vein is patent on color Doppler imaging with normal direction of blood flow towards the liver. Other: No ascites IMPRESSION: Cholelithiasis with gallbladder wall thickening suggesting changes of chronic cholecystitis in the appropriate clinical setting. Electronically Signed   By: Fidela Salisbury MD   On: 03/30/2020 00:25    Labs: BNP (last 3 results) No results for input(s): BNP in the last 8760 hours. Basic Metabolic Panel: Recent Labs  Lab 03/29/20 1740 03/30/20 0555 03/31/20 0535 04/01/20 0547  NA 138 137 139 137  K 3.0* 3.3* 3.3* 3.9  CL 101 104 110 106  CO2 23 24 20* 18*  GLUCOSE 207* 149* 93 164*  BUN 27* 24* 20 24*  CREATININE 1.18 1.03 0.92 1.06  CALCIUM 8.7* 8.0* 8.0* 8.2*  MG  --  2.0  --   --   PHOS  --  3.4  --   --    Liver Function Tests: Recent Labs  Lab 03/29/20 1740 03/30/20 0555 03/31/20 0535 04/01/20 0547  AST 683* 276* 66* 43*  ALT 300* 269* 157* 118*  ALKPHOS 454* 408* 355* 387*  BILITOT 3.1* 4.3*  4.5* 2.4*  PROT 7.0 6.0* 5.6* 6.0*  ALBUMIN 3.7 3.2* 2.8* 2.9*   Recent Labs  Lab 03/29/20 1740  LIPASE 191*   No results for input(s): AMMONIA in the last 168 hours. CBC: Recent Labs  Lab 03/29/20 1740 03/30/20 0555 03/31/20 0535 04/01/20 0547  WBC 12.6* 14.7* 9.8 8.0  HGB 11.5* 10.0* 9.4* 10.7*  HCT 35.2* 31.1* 30.3* 34.4*  MCV 93.4 94.8 96.2 95.8  PLT 383 344 297 349   Cardiac Enzymes: No results for input(s): CKTOTAL, CKMB, CKMBINDEX, TROPONINI in the last 168 hours. BNP: Invalid input(s): POCBNP CBG: Recent Labs  Lab 03/31/20 1758 03/31/20 1855 03/31/20 2007 03/31/20 2147 04/01/20 0755  GLUCAP 82 90 91 119* 132*   D-Dimer No results for input(s): DDIMER in the last 72 hours. Hgb A1c No results for input(s): HGBA1C in the last 72 hours. Lipid Profile No results for input(s): CHOL, HDL, LDLCALC, TRIG, CHOLHDL, LDLDIRECT in the last 72 hours. Thyroid function studies No results for input(s): TSH, T4TOTAL, T3FREE, THYROIDAB in the last 72 hours.  Invalid input(s): FREET3 Anemia work up  No results for input(s): VITAMINB12, FOLATE, FERRITIN, TIBC, IRON, RETICCTPCT in the last 72 hours. Urinalysis    Component Value Date/Time   COLORURINE AMBER (A) 03/29/2020 2330   APPEARANCEUR CLEAR 03/29/2020 2330   LABSPEC 1.032 (H) 03/29/2020 2330   PHURINE 7.0 03/29/2020 2330   GLUCOSEU NEGATIVE 03/29/2020 2330   HGBUR NEGATIVE 03/29/2020 2330   BILIRUBINUR NEGATIVE 03/29/2020 2330   KETONESUR NEGATIVE 03/29/2020 2330   PROTEINUR NEGATIVE 03/29/2020 2330   NITRITE NEGATIVE 03/29/2020 2330   LEUKOCYTESUR NEGATIVE 03/29/2020 2330   Sepsis Labs Invalid input(s): PROCALCITONIN,  WBC,  LACTICIDVEN Microbiology Recent Results (from the past 240 hour(s))  Resp Panel by RT-PCR (Flu A&B, Covid) Nasopharyngeal Swab     Status: None   Collection Time: 03/30/20 12:36 AM   Specimen: Nasopharyngeal Swab; Nasopharyngeal(NP) swabs in vial transport medium  Result Value Ref  Range Status   SARS Coronavirus 2 by RT PCR NEGATIVE NEGATIVE Final    Comment: (NOTE) SARS-CoV-2 target nucleic acids are NOT DETECTED.  The SARS-CoV-2 RNA is generally detectable in upper respiratory specimens during the acute phase of infection. The lowest concentration of SARS-CoV-2 viral copies this assay can detect is 138 copies/mL. A negative result does not preclude SARS-Cov-2 infection and should not be used as the sole basis for treatment or other patient management decisions. A negative result may occur with  improper specimen collection/handling, submission of specimen other than nasopharyngeal swab, presence of viral mutation(s) within the areas targeted by this assay, and inadequate number of viral copies(<138 copies/mL). A negative result must be combined with clinical observations, patient history, and epidemiological information. The expected result is Negative.  Fact Sheet for Patients:  EntrepreneurPulse.com.au  Fact Sheet for Healthcare Providers:  IncredibleEmployment.be  This test is no t yet approved or cleared by the [place] FDA and  has been authorized for detection and/or diagnosis of SARS-CoV-2 by FDA under an Emergency Use Authorization (EUA). This EUA will remain  in effect (meaning this test can be used) for the duration of the COVID-19 declaration under Section 564(b)(1) of the Act, 21 U.S.C.section 360bbb-3(b)(1), unless the authorization is terminated  or revoked sooner.       Influenza A by PCR NEGATIVE NEGATIVE Final   Influenza B by PCR NEGATIVE NEGATIVE Final    Comment: (NOTE) The Xpert Xpress SARS-CoV-2/FLU/RSV plus assay is intended as an aid in the diagnosis of influenza from Nasopharyngeal swab specimens and should not be used as a sole basis for treatment. Nasal washings and aspirates are unacceptable for Xpert Xpress SARS-CoV-2/FLU/RSV testing.  Fact Sheet for  Patients: EntrepreneurPulse.com.au  Fact Sheet for Healthcare Providers: IncredibleEmployment.be  This test is not yet approved or cleared by the [place] FDA and has been authorized for detection and/or diagnosis of SARS-CoV-2 by FDA under an Emergency Use Authorization (EUA). This EUA will remain in effect (meaning this test can be used) for the duration of the COVID-19 declaration under Section 564(b)(1) of the Act, 21 U.S.C. section 360bbb-3(b)(1), unless the authorization is terminated or revoked.  Performed at [facility], [place] [address], [place], [place] [address]      Time coordinating discharge:35  minutes  SIGNED: Antonieta Pert, MD  [facility] 04/01/2020, 1:34 PM  If 7PM-7AM, please contact night-coverage [email or web address]

## 2020-04-01 NOTE — Discharge Instructions (Signed)
[facility] LAPAROSCOPIC SURGERY: POST OP INSTRUCTIONS Always review your discharge instruction sheet given to you by the facility where your surgery was performed. IF YOU HAVE DISABILITY OR FAMILY LEAVE FORMS, YOU MUST BRING THEM TO THE OFFICE FOR PROCESSING.   DO NOT GIVE THEM TO YOUR DOCTOR.  PAIN CONTROL  1. First take acetaminophen (Tylenol) AND/or ibuprofen (Advil) to control your pain after surgery.  Follow directions on package.  Taking acetaminophen (Tylenol) and/or ibuprofen (Advil) regularly after surgery will help to control your pain and lower the amount of prescription pain medication you may need.  You should not take more than 3,000 mg (3 grams) of acetaminophen (Tylenol) in 24 hours.  You should not take ibuprofen (Advil), aleve, motrin, naprosyn or other NSAIDS if you have a history of stomach ulcers or chronic kidney disease.  2. A prescription for pain medication may be given to you upon discharge.  Take your pain medication as prescribed, if you still have uncontrolled pain after taking acetaminophen (Tylenol) or ibuprofen (Advil). 3. Use ice packs to help control pain. 4. If you need a refill on your pain medication, please contact your pharmacy.  They will contact our office to request authorization. Prescriptions will not be filled after 5pm or on week-ends.  HOME MEDICATIONS 5. Take your usually prescribed medications unless otherwise directed.  DIET 6. You should follow a light diet the first few days after arrival home.  Be sure to include lots of fluids daily. Avoid fatty, fried foods.   CONSTIPATION 7. It is common to experience some constipation after surgery and if you are taking pain medication.  Increasing fluid intake and taking a stool softener (such as Colace) will usually help or prevent this problem from occurring.  A mild laxative (Milk of Magnesia or Miralax) should be taken according to package instructions if there are no bowel  movements after 48 hours.  WOUND/INCISION CARE 8. Most patients will experience some swelling and bruising in the area of the incisions.  Ice packs will help.  Swelling and bruising can take several days to resolve.  9. Unless discharge instructions indicate otherwise, follow guidelines below  a. STERI-STRIPS - you may remove your outer bandages 48 hours after surgery, and you may shower at that time.  You have steri-strips (small skin tapes) in place directly over the incision.  These strips should be left on the skin for 7-10 days.   b. DERMABOND/SKIN GLUE - you may shower in 24 hours.  The glue will flake off over the next 2-3 weeks. 10. Any sutures or staples will be removed at the office during your follow-up visit.  ACTIVITIES 11. You may resume regular (light) daily activities beginning the next day--such as daily self-care, walking, climbing stairs--gradually increasing activities as tolerated.  You may have sexual intercourse when it is comfortable.  Refrain from any heavy lifting or straining until approved by your doctor. a. You may drive when you are no longer taking prescription pain medication, you can comfortably wear a seatbelt, and you can safely maneuver your car and apply brakes.  FOLLOW-UP 12. You should see your doctor in the office for a follow-up appointment approximately 2-3 weeks after your surgery.  You should have been given your post-op/follow-up appointment when your surgery was scheduled.  If you did not receive a post-op/follow-up appointment, make sure that you call for this appointment within a day or two after you arrive home to insure a convenient appointment time.  WHEN   TO CALL YOUR DOCTOR: 1. Fever over 101.0 2. Inability to urinate 3. Continued bleeding from incision. 4. Increased pain, redness, or drainage from the incision. 5. Increasing abdominal pain  The clinic staff is available to answer your questions during regular business hours.  Please don't  hesitate to call and ask to speak to one of the nurses for clinical concerns.  If you have a medical emergency, go to the nearest emergency room or call 911.  A surgeon from [facility] is always on call at the hospital. [address], [address], [place], [place]  [address] ? [address] [address], [place], [place]   [address] [phone] ? [phone] ? FAX [phone] Web site: [email or web address]  

## 2020-04-01 NOTE — Progress Notes (Signed)
[facility] Progress Note  1 Day Post-Op  Subjective: CC:  Pain controlled - denies abd pain. Mild soreness with coughing or sitting up. Urinating without issues. +flatus.   Objective: Vital signs in last 24 hours: Temp:  [97.7 F (36.5 C)-98.7 F (37.1 C)] 98.4 F (36.9 C) (03/10 0458) Pulse Rate:  [60-96] 71 (03/10 0458) Resp:  [13-20] 14 (03/10 0458) BP: (120-176)/(61-97) 120/65 (03/10 0458) SpO2:  [96 %-100 %] 98 % (03/10 0458) Last BM Date: 03/29/20  Intake/Output from previous day: 03/09 0701 - 03/10 0700 In: 850 [I.V.:850] Out: 20 [Blood:20] Intake/Output this shift: Total I/O In: 240 [P.O.:240] Out: -   PE: Gen:  Alert, NAD, pleasant Card:  Regular rate and rhythm Pulm:  Normal effort ORA Abd: Soft, non-tender, mid distention, trochar sites c/d/i without drainage or ecchymosis.  Skin: warm and dry, no rashes  Psych: A&Ox3   Lab Results:  Recent Labs    03/31/20 0535 04/01/20 0547  WBC 9.8 8.0  HGB 9.4* 10.7*  HCT 30.3* 34.4*  PLT 297 349   BMET Recent Labs    03/31/20 0535 04/01/20 0547  NA 139 137  K 3.3* 3.9  CL 110 106  CO2 20* 18*  GLUCOSE 93 164*  BUN 20 24*  CREATININE 0.92 1.06  CALCIUM 8.0* 8.2*   PT/INR Recent Labs    03/30/20 0555  LABPROT 15.0  INR 1.2   CMP     Component Value Date/Time   NA 137 04/01/2020 0547   NA 141 08/15/2019 1326   K 3.9 04/01/2020 0547   CL 106 04/01/2020 0547   CO2 18 (L) 04/01/2020 0547   GLUCOSE 164 (H) 04/01/2020 0547   BUN 24 (H) 04/01/2020 0547   BUN 20 08/15/2019 1326   CREATININE 1.06 04/01/2020 0547   CALCIUM 8.2 (L) 04/01/2020 0547   PROT 6.0 (L) 04/01/2020 0547   PROT 6.4 08/15/2019 1326   ALBUMIN 2.9 (L) 04/01/2020 0547   ALBUMIN 3.6 (L) 08/15/2019 1326   AST 43 (H) 04/01/2020 0547   ALT 118 (H) 04/01/2020 0547   ALKPHOS 387 (H) 04/01/2020 0547   BILITOT 2.4 (H) 04/01/2020 0547   BILITOT 0.2 08/15/2019 1326   GFRNONAA >60 04/01/2020 0547   GFRAA 66 08/15/2019  1326   Lipase     Component Value Date/Time   LIPASE 191 (H) 03/29/2020 1740       Studies/Results: DG Cholangiogram Operative  Result Date: 03/31/2020 CLINICAL DATA:  Known cholelithiasis and cholecystectomy EXAM: INTRAOPERATIVE CHOLANGIOGRAM TECHNIQUE: Cholangiographic images from the C-arm fluoroscopic device were submitted for interpretation post-operatively. Please see the procedural report for the amount of contrast and the fluoroscopy time utilized. COMPARISON:  MRI from the previous day. FINDINGS: 42 seconds of fluoroscopy was utilized. Multiple gland geographic runs were obtained. Mild visualization of the intrahepatic and extrahepatic biliary tree is noted. No definitive filling defects are seen although the degree of ductal opacification was limited. IMPRESSION: No definitive retained stones although opacification of the ductal system was limited. Electronically Signed   By: Inez Catalina M.D.   On: 03/31/2020 17:58   NM Hepatobiliary Liver Func  Result Date: 03/30/2020 CLINICAL DATA:  Abdominal pain. RIGHT upper quadrant pain with vomiting. Gallstones on ultrasound imaging. Elevated bilirubin. EXAM: NUCLEAR MEDICINE HEPATOBILIARY IMAGING TECHNIQUE: Sequential images of the abdomen were obtained out to 60 minutes following intravenous administration of radiopharmaceutical. RADIOPHARMACEUTICALS:  7.3 mCi Tc-4m  Choletec IV COMPARISON:  CT 03/29/2020, ultrasound 03/30/2020 FINDINGS: Prompt clearance radiotracer from blood  pool and homogeneous uptake liver. Imaging performed for 90 minutes. There is no evidence transit radiotracer bile into the common bile duct or intestine. The gallbladder does not fill. IMPRESSION: 1. No excretion radiotracer bile into the common bile duct or proximal small bowel. Findings are indicative of hepatitis versus bile duct obstruction. In patient with multiple gallstones and elevated bilirubin and acute abdominal pain, favor common bile duct obstruction secondary  to choledocholithiasis 2. Patency of the cystic duct cannot be confirmed. Non filling of the gallbladder could indicate cholecystitis versus non filling of the gallbladder related to hydrostatic pressure from common bile duct obstruction. These results will be called to the ordering clinician or representative by the Radiologist Assistant, and communication documented in the PACS or [employer]. Electronically Signed   By: Suzy Bouchard M.D.   On: 03/30/2020 12:43   MR ABDOMEN MRCP W WO CONTAST  Result Date: 03/30/2020 CLINICAL DATA:  Right upper quadrant pain, nondiagnostic ultrasound EXAM: MRI ABDOMEN WITHOUT AND WITH CONTRAST (INCLUDING MRCP) TECHNIQUE: Multiplanar multisequence MR imaging of the abdomen was performed both before and after the administration of intravenous contrast. Heavily T2-weighted images of the biliary and pancreatic ducts were obtained, and three-dimensional MRCP images were rendered by post processing. CONTRAST:  61mL GADAVIST GADOBUTROL 1 MMOL/ML IV SOLN COMPARISON:  HIDA and ultrasound 03/30/2020, CT 03/29/2020 FINDINGS: Lower chest: Small bilateral pleural effusions with some adjacent passive atelectasis. Included mediastinum is unremarkable. Hepatobiliary: Normal intrinsic liver signal. Smooth surface contour. No focal liver lesion on multiphase imaging. Mild gallbladder wall thickening and faint enhancement. Gallbladder contains multiple filling defects and biliary sludge. Few gallstones are seen in the gallbladder neck and cystic duct. No distal common bile duct stones are seen though there is some notable signal dropout at the level of the head of the pancreas secondary to a large air-filled duodenal diverticulum better seen on comparison CT. Pancreas: Partial fatty replacement of the pancreas. No concerning focal pancreatic lesion is seen. No pancreatic ductal dilatation or peripancreatic inflammation. 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 Spleen: Few tiny T2 hyperintense and T1 hypointense subcentimeter foci are present within the spleen without worrisome postcontrast enhancement or other aggressive features, likely benign cyst. Normal splenic size. Adrenals/Urinary Tract: No concerning adrenal mass or lesion. Kidneys are symmetric in size and normally located. Cortical scarring is seen bilaterally. Some scattered parapelvic and cortical cysts are present in both kidneys. No concerning focal renal lesion. Mild T2 hyperintense perinephric stranding is nonspecific but can be seen with diminished renal function or advanced age. 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 No hydronephrosis. Stomach/Bowel: Distal esophagus, stomach and duodenum are unremarkable. Air-filled diverticulum with signal dropout at the level of the pancreatic head again measuring up to 2.8 cm in size. Several clustered loops of bowel in the right hemiabdomen are likely related to postsurgical mobilization. No concerning thickening or dilatation of the visible loops of large or small bowel. Scattered colonic diverticula without focal inflammation to suggest diverticulitis. Vascular/Lymphatic: Atherosclerotic plaque of the abdominal aorta and branch vessels. No worrisome aneurysm or ectasia. No concerning adenopathy. Other: No visible abdominal free fluid or ascites. No bowel containing hernia seen within the margins of imaging. Musculoskeletal: Multilevel discogenic and facet degenerative changes in the spine. Compression deformity at L1 is similar to prior, difficult to fully assess on this exam. Probable hemangioma T12. Normal cord signal. IMPRESSION: 1. Cholelithiasis with mild gallbladder wall thickening and faint enhancement. Findings are  compatible with acute cholecystitis in the appropriate clinical setting. 2. Few small gallstones present in the gallbladder neck and proximal cystic duct. No visible common bile duct stones are  seen though there is signal dropout at the level of the head of the pancreas secondary to a large air-filled duodenal diverticulum better seen on comparison CT. 3. Mild bilateral perinephric stranding, can be seen with advanced age or diminished renal function though should correlate for urinary symptoms to exclude urinary tract infection. 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 4. Compression deformity at L1 is similar to prior exam, difficult to fully assess on this exam. Electronically Signed   By: Lovena Le M.D.   On: 03/30/2020 19:22    Anti-infectives: Anti-infectives (From admission, onward)   Start     Dose/Rate Route Frequency Ordered Stop   04/01/20 0800  piperacillin-tazobactam (ZOSYN) IVPB 3.375 g        3.375 g 12.5 mL/hr over 240 Minutes Intravenous Every 8 hours 04/01/20 0744 04/06/20 0759   03/30/20 2200  cefTRIAXone (ROCEPHIN) 2 g in sodium chloride 0.9 % 100 mL IVPB  Status:  Discontinued        2 g 200 mL/hr over 30 Minutes Intravenous Every 24 hours 03/30/20 0212 04/01/20 0744   03/30/20 0215  metroNIDAZOLE (FLAGYL) IVPB 500 mg  Status:  Discontinued        500 mg 100 mL/hr over 60 Minutes Intravenous Every 8 hours 03/30/20 0212 04/01/20 0744   03/30/20 0145  cefTRIAXone (ROCEPHIN) 2 g in sodium chloride 0.9 % 100 mL IVPB        2 g 200 mL/hr over 30 Minutes Intravenous  Once 03/30/20 0134 03/30/20 0318     Assessment/Plan Acute on chronic cholecystitis  S/P laparoscopic cholecystectomy with IOC 03/31/20 Dr. Hassell Done - pain controlled, tolerating PO, mobilizing -stable for discharge - follow up provided, patient refused need for narcotic Rx   LOS: 2 days    Obie Dredge, [facility] Please see Amion for pager number during day hours 7:00am-4:30pm

## 2020-04-02 LAB — SURGICAL PATHOLOGY

## 2020-04-16 ENCOUNTER — Ambulatory Visit: Payer: Medicare Other | Admitting: Psychiatry

## 2020-04-23 ENCOUNTER — Other Ambulatory Visit: Payer: Self-pay

## 2020-04-23 ENCOUNTER — Ambulatory Visit (INDEPENDENT_AMBULATORY_CARE_PROVIDER_SITE_OTHER): Payer: Medicare Other | Admitting: Psychiatry

## 2020-04-23 DIAGNOSIS — F331 Major depressive disorder, recurrent, moderate: Secondary | ICD-10-CM

## 2020-04-23 DIAGNOSIS — G4733 Obstructive sleep apnea (adult) (pediatric): Secondary | ICD-10-CM

## 2020-04-23 DIAGNOSIS — G4721 Circadian rhythm sleep disorder, delayed sleep phase type: Secondary | ICD-10-CM | POA: Diagnosis not present

## 2020-04-23 NOTE — Progress Notes (Signed)
Psychotherapy Progress Note [facility] [name], PhD LP  Patient ID: ELLEN MAYOL     MRN: [id number] Therapy format: Individual psychotherapy Date: 04/23/2020      Start: 9:12a     Stop: 9:58a     Time Spent: 46 min Location: In-person   Session narrative (presenting needs, interim history, self-report of stressors and symptoms, applications of prior therapy, status changes, and interventions made in session) EHR notes hernia repair (3 of them) shortly after first seen, then cholecystectomy 3 weeks later.  No c/o discomfort or complications.  Main issue is having to ask help from son and grandson, who have limited time, plus not wanting to ask.   Times of not feeling like doing anything.  Explored physical causes -- has OSA but uses CPAP reliably, and presumably competent.  Long hx of evening work, so he is used to a delayed sleep cycle.  Falls asleep readily about 1 or 2am, up 10am usually.  Has started stationary bike (Monday), feels alright.  Started 5 min, 2-3/day, hoping to work up.  As outdoor tasks go, wants to get out for a few things, could do more.  Used to walk the dogs, 6 at a time, for half hour[place], but fell well off the habit.  Discussed ways to resume smaller-scale, e.g., 1 dog at a time, short distance, whatever his body and strength will actually tolerate. 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  Could see himself taking one out, just getting up a hill nearby and returning, as a significant step in resuming.  Realizes he's been watching Gunsmoke for 2 weeks, which may lull him into sitting longer, but it's an old favorite and a comfort to see.  Reveals late in discussion that he is out of service for 4 months with psychiatry, and off his antidepressant.    Encouraged be clear with provider whether he intends to rely on medication, and either reschedule and renew Rx ahead  of the visit (if allowed) or inform both RX and [facility] of an intention to try without med.  Hx of med problems.  Wellbutrin used to make him fall.  Encouraged in self-care and movement.  Therapeutic modalities: Cognitive Behavioral Therapy and Solution-Oriented/Positive Psychology  Mental Status/Observations:  Appearance:   Casual     Behavior:  Appropriate  Motor:  Normal  Speech/Language:   Clear and Coherent  Affect:  Appropriate  Mood:  dysthymic  Thought process:  normal  Thought content:    WNL  Sensory/Perceptual disturbances:    WNL  Orientation:  Fully oriented  Attention:  Good    Concentration:  Good  Memory:  WNL  Insight:    Fair  Judgment:   Good  Impulse Control:  Fair   Risk Assessment: Danger to Self: No Self-injurious Behavior: No Danger to Others: No Physical Aggression / Violence: No Duty to Warn: No Access to Firearms a concern: No  Assessment of progress:  progressing  Diagnosis:   ICD-10-CM   1. Major depressive disorder, recurrent episode, moderate (HCC)  F33.1   2. Obstructive sleep apnea syndrome  G47.33   3. 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 Prioritize moving blood, circulation -- continue stationary bike, push time and intensity . Start dog walking, as short as 10 min, as few as 1 dog, try the uphill as stated .  Recommend check and see if getting B vitamin supplementation (MVI for 50+, B-complex) and adequate vitamin D might help with available energy -- feed the brain, don't just medicate it . OK with current circadian rhythm, recommend advancing the cycle to better fit natural light/dark  . Other recommendations/advice as may be noted above . Continue to utilize previously learned skills ad lib . Maintain medication as prescribed and work faithfully with relevant prescriber(s) if any changes are desired or seem indicated . Call the clinic on-call service, present to ER, or call 911 if any life-threatening  psychiatric crisis Return in about 3 weeks (around 05/14/2020) for needs reschedule with Ms. Adelene Idler. . Already scheduled visit in this office Visit date not found.  Blanchie Serve, PhD [name], PhD LP Clinical Psychologist, [facility] [address], [place], [place] [address] [phone]

## 2020-05-25 ENCOUNTER — Ambulatory Visit: Payer: Medicare Other | Admitting: Psychiatry

## 2020-06-01 ENCOUNTER — Other Ambulatory Visit: Payer: Self-pay | Admitting: Internal Medicine

## 2020-06-01 ENCOUNTER — Ambulatory Visit
Admission: RE | Admit: 2020-06-01 | Discharge: 2020-06-01 | Disposition: A | Discharge status: Home / Self Care | Payer: Medicare Other | Source: Ambulatory Visit | Attending: Internal Medicine | Admitting: Internal Medicine

## 2020-06-01 DIAGNOSIS — M25511 Pain in right shoulder: Secondary | ICD-10-CM

## 2020-06-11 ENCOUNTER — Ambulatory Visit: Payer: Medicare Other | Admitting: Psychiatry

## 2020-06-15 ENCOUNTER — Ambulatory Visit: Payer: Medicare Other | Admitting: Psychiatry

## 2020-06-30 ENCOUNTER — Other Ambulatory Visit: Payer: Self-pay

## 2020-06-30 ENCOUNTER — Encounter: Payer: Self-pay | Admitting: Physician Assistant

## 2020-06-30 ENCOUNTER — Ambulatory Visit (INDEPENDENT_AMBULATORY_CARE_PROVIDER_SITE_OTHER): Payer: Medicare Other | Admitting: Physician Assistant

## 2020-06-30 DIAGNOSIS — F3342 Major depressive disorder, recurrent, in full remission: Secondary | ICD-10-CM

## 2020-06-30 DIAGNOSIS — G4733 Obstructive sleep apnea (adult) (pediatric): Secondary | ICD-10-CM

## 2020-06-30 DIAGNOSIS — G4721 Circadian rhythm sleep disorder, delayed sleep phase type: Secondary | ICD-10-CM | POA: Diagnosis not present

## 2020-06-30 NOTE — Progress Notes (Signed)
[facility]  Patient ID: Antonio [name],  MRN: [id number]  PCP: Leeroy Cha, MD  Date of Evaluation: 06/30/2020 Time spent:30 minutes  Chief Complaint:  Chief Complaint    Depression      HISTORY/CURRENT STATUS: For routine med check.  Has had several health issues since LOV. See ROS. Not on any meds for mental health right now and doesn't feel like he needs anything. It's hard to tell if he's procrastinating or not, says he's not been having much to do. He and wife are getting along well right now. Not having easy anger and irritability like he was. Not isolating.  Denies anxiety.  He still does not sleep well but mostly it is because he stays up to take care of their 19 dogs (they had a [place] rescue).  He will sleep from 1 or 2:00 in the morning to around 10.  In the past couple of weeks they have only had one car so he has been getting up at 830 to take his wife to work.  He does not take naps during the day.  Denies suicidal or homicidal thoughts.  Denies muscle or joint pain, stiffness, or dystonia. Denies dizziness, syncope, seizures, numbness, tingling, tremor, tics, unsteady gait, slurred speech, confusion.   Individual Medical History/ Review of Systems: Changes? :Yes  Had Covid, gall bladder removed. Had inguinal hernia repair bilat and abd hernia repair, all since LOV  Past medications for mental health diagnoses include: Wellbutrin SR Wellbutrin XL, Zoloft caused falls, Cymbalta  Allergies: Metformin and related, Wellbutrin [bupropion], and Zoloft [sertraline]  Current Medications:  Current Outpatient Medications:  .  acetaminophen (TYLENOL) 500 MG tablet, , Disp: , Rfl:  .  amLODipine (NORVASC) 5 MG tablet, Take 1 tablet by mouth daily., Disp: , Rfl:  .  atorvastatin (LIPITOR) 10 MG tablet, , Disp: , Rfl:  .  cholecalciferol (VITAMIN D) 1000 units tablet, Take 1,000 Units by mouth in the morning and at bedtime. , Disp: , Rfl:  .   ELDERBERRY PO, Take 2 each by mouth daily. Gummies, Disp: , Rfl:  .  famotidine (PEPCID) 10 MG tablet, Take 10 mg by mouth daily. , Disp: , Rfl:  .  ferrous sulfate 325 (65 FE) MG tablet, Take 325 mg by mouth daily with breakfast., Disp: , Rfl:  .  potassium chloride SA (K-DUR,KLOR-CON) 20 MEQ tablet, Take 20 mEq by mouth daily. , Disp: , Rfl: 3 .  sildenafil (REVATIO) 20 MG tablet, , Disp: , Rfl:  .  Turmeric Curcumin 500 MG CAPS, , Disp: , Rfl:  .  empagliflozin (JARDIANCE) 25 MG TABS tablet, Take 1 tablet (25 mg total) by mouth daily before breakfast. (Patient not taking: Reported on 06/30/2020), Disp: 30 tablet, Rfl: 11 .  lamoTRIgine (LAMICTAL) 25 MG tablet, TAKE 1 TABLET BY MOUTH  DAILY (Patient not taking: Reported on 06/30/2020), Disp: 90 tablet, Rfl: 3 .  Tamsulosin HCl (FLOMAX) 0.4 MG CAPS, Take 1 capsule (0.4 mg total) by mouth daily after breakfast. (Patient not taking: Reported on 06/30/2020), Disp: 30 capsule, Rfl: 0 .  traMADol (ULTRAM) 50 MG tablet, Take 1-2 tablets (50-100 mg total) by mouth every 6 (six) hours as needed for moderate pain or severe pain. (Patient not taking: Reported on 06/30/2020), Disp: 20 tablet, Rfl: 0 Medication Side Effects: Mild increase of drowsiness.  Family Medical/ Social History: Changes? No  MENTAL HEALTH EXAM:  There were no vitals taken for this visit.There is no height or weight on file  to calculate BMI.  General Appearance: Casual and Well Groomed  Eye Contact:  Good  Speech:  Clear and Coherent  Volume:  Normal  Mood:  Euthymic  Affect:  Congruent  Thought Process:  Goal Directed and Descriptions of Associations: Intact  Orientation:  Full (Time, Place, and Person)  Thought Content: Logical   Suicidal Thoughts:  No  Homicidal Thoughts:  No  Memory:  WNL  Judgement:  Good  Insight:  Good  Psychomotor Activity:  Normal  Concentration:  Concentration: Good  Recall:  Good  Fund of Knowledge: Good  Language: Good  Assets:  Desire for  Improvement  ADL's:  Intact  Cognition: WNL  Prognosis:  Good    DIAGNOSES:    ICD-10-CM   1. Major depression, recurrent, full remission ([place])  F33.42   2. Sleep disorder, circadian, delayed sleep phase type  G47.21   3. Obstructive sleep apnea syndrome  G47.33      Receiving Psychotherapy: No    RECOMMENDATIONS:  PDMP was reviewed. I provided 30 minutes of face-to-face time during this encounter, including time spent before and after the visit in records review, medical decision making, and charting. From a mental health medication standpoint he seems to be doing well.  It is difficult to know really because he has had 4 separate surgeries in the past 6 months and that definitely affects mental health either positively or negatively.  He prefers not to be on any psychiatric medication at this time, does not feel that he needs it and also he has had dizziness or other side effects with just about everything he has taken.  I understand and agree at this time.  He knows to call if he has any problems before her next visit. Continue CPAP use. Return in 3 months.  [name], PA-C

## 2020-07-27 ENCOUNTER — Other Ambulatory Visit: Payer: Self-pay

## 2020-07-27 ENCOUNTER — Ambulatory Visit (INDEPENDENT_AMBULATORY_CARE_PROVIDER_SITE_OTHER): Payer: Medicare Other | Admitting: Psychiatry

## 2020-07-27 DIAGNOSIS — F331 Major depressive disorder, recurrent, moderate: Secondary | ICD-10-CM

## 2020-07-27 DIAGNOSIS — G4721 Circadian rhythm sleep disorder, delayed sleep phase type: Secondary | ICD-10-CM | POA: Diagnosis not present

## 2020-07-27 DIAGNOSIS — Z8659 Personal history of other mental and behavioral disorders: Secondary | ICD-10-CM | POA: Diagnosis not present

## 2020-07-27 DIAGNOSIS — F651 Transvestic fetishism: Secondary | ICD-10-CM

## 2020-07-27 DIAGNOSIS — Z630 Problems in relationship with spouse or partner: Secondary | ICD-10-CM

## 2020-07-27 DIAGNOSIS — G4733 Obstructive sleep apnea (adult) (pediatric): Secondary | ICD-10-CM

## 2020-07-27 NOTE — Progress Notes (Signed)
Psychotherapy Progress Note [facility] [name], PhD LP  Patient ID: Antonio [name]     MRN: [id number] Therapy format: Individual psychotherapy Date: 07/27/2020      Start: 11:14a     Stop: 12:04p     Time Spent: 50 min Location: In-person   Session narrative (presenting needs, interim history, self-report of stressors and symptoms, applications of prior therapy, status changes, and interventions made in session) Has had 4 surgeries by his count 3 hernias and cholecystectomy) plus COVID since last seen.  Having brain fog since, though he did not have pneumonia or bronchitis.  Still active with rescue dogs, currently 19 of them, with no c/o difficulty focusing while with them but continued lagging energy, temptation to sleep when not otherwise engaged.  Says today he has always more or less hated the world.  Used to get in trouble as a kid, e.g., putting sand in the gas tank of a steam shovel at a lot that the kids wanted to still play on, steal hubcaps and leave them by the roadside, racing on the [place], other things in high school (late 39s), always caught.  Parents always bought off trouble, and apparently a detective looked out for him.  Spent 10 days in a juvenile detention center.    These days, acknowledges getting short-tempered with wife of 69 years as he has gotten older.  15 years of no physical affection.  Sleep cycle tends to mean late to bed and 6.5 hr sleep.  Has wanted to move his dog duties earlier to enable more timely sleep but never gets around to it.    42 years have never really had a stable, functioning sex life.  Est 5 year ago tried, but ED prevented.  Never saw physical affection between parents, mother was aloof.  Previous marriage 5-6 years, married on account of pregnancy and the draft situation.  Retirement life has been overtaken with looking after many dogs.    Reveals a long habit of dressing in women's clothes.  Believes mother knew.   Believes it may have played a role in the breakdown of the first marriage.  Might have been an effort at symbolic intimacy, with distant mother.  Did begin as an aphrodisiac in adolescence.  Went 6 years without transvestism while single.  [name] has known about it and has possibly not trusted him.  Explored temptations, which are low now, and discussed possibilities of conferring further with wife to share insights today and confirm near-lack of interest in it.  Therapeutic modalities: Cognitive Behavioral Therapy, Solution-Oriented/Positive Psychology, and Psycho-education/Bibliotherapy  Mental Status/Observations:  Appearance:   Casual     Behavior:  Appropriate  Motor:  Normal  Speech/Language:   Clear and Coherent  Affect:  Appropriate  Mood:  dysthymic  Thought process:  normal  Thought content:    WNL  Sensory/Perceptual disturbances:    WNL  Orientation:  Fully oriented  Attention:  Good    Concentration:  Good  Memory:  WNL  Insight:    Good  Judgment:   Good  Impulse Control:  Good   Risk Assessment: Danger to Self: No Self-injurious Behavior: No Danger to Others: No Physical Aggression / Violence: No Duty to Warn: No Access to Firearms a concern: No  Assessment of progress:  progressing  Diagnosis:   ICD-10-CM   1. Major depressive disorder, recurrent episode, moderate (HCC)  F33.1     2. Relationship problem between partners  Z63.0  3. History of conduct disorder  Z86.59     4. Sleep disorder, circadian, delayed sleep phase type  G47.21     5. Obstructive sleep apnea syndrome  G47.33     6. 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      Plan:  Encouraged confer with wife about new insights into his transvestic fetish and how to understand his attempts to improve touch and intimacy -- frame as sincere interest in improving relationship, cutting loneliness, approaching comfort the "right" way, and undoing a long history of conditioning where his  sexual impulses were coopted from adolescence and both of them let each other get used to non-touch as a way of life when it doesn't have to be Previous recommendation to use stationary bike, push time and intensity as tolerated Other recommendations/advice as may be noted above Continue to utilize previously learned skills ad lib Maintain medication as prescribed and work faithfully with relevant prescriber(s) if any changes are desired or seem indicated Call the clinic on-call service, present to ER, or call 911 if any life-threatening psychiatric crisis Return 3-4 wks, est.. Already scheduled visit in this office 09/30/2020.  Blanchie Serve, PhD [name], PhD LP Clinical Psychologist, [facility] [address] [place], [place] [address] [phone]

## 2020-09-03 ENCOUNTER — Other Ambulatory Visit: Payer: Self-pay

## 2020-09-03 ENCOUNTER — Ambulatory Visit (INDEPENDENT_AMBULATORY_CARE_PROVIDER_SITE_OTHER): Payer: Medicare Other | Admitting: Psychiatry

## 2020-09-03 DIAGNOSIS — G4733 Obstructive sleep apnea (adult) (pediatric): Secondary | ICD-10-CM

## 2020-09-03 DIAGNOSIS — G9332 Myalgic encephalomyelitis/chronic fatigue syndrome: Secondary | ICD-10-CM

## 2020-09-03 DIAGNOSIS — R5382 Chronic fatigue, unspecified: Secondary | ICD-10-CM | POA: Diagnosis not present

## 2020-09-03 DIAGNOSIS — U099 Post covid-19 condition, unspecified: Secondary | ICD-10-CM

## 2020-09-03 DIAGNOSIS — G4721 Circadian rhythm sleep disorder, delayed sleep phase type: Secondary | ICD-10-CM

## 2020-09-03 DIAGNOSIS — F331 Major depressive disorder, recurrent, moderate: Secondary | ICD-10-CM | POA: Diagnosis not present

## 2020-09-03 DIAGNOSIS — F651 Transvestic fetishism: Secondary | ICD-10-CM

## 2020-09-03 NOTE — Progress Notes (Signed)
Psychotherapy Progress Note [facility] Antonio Moore, PhD LP  Patient ID: Antonio [name]     MRN: [id number] Therapy format: Individual psychotherapy Date: 09/03/2020      Start: 10:13a     Stop: 11:00a     Time Spent: 47 min Location: In-person   Session narrative (presenting needs, interim history, self-report of stressors and symptoms, applications of prior therapy, status changes, and interventions made in session) Tends to stay foggy and weak, most days.  Attributes to post-COVID, says his physician has only said to wait 6-9 months for it to get better.  Advocated small, frequent bouts of exertion and asking PCP for referral to PT if possible, as better activity should be brain stimulating.  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  Considering taking day trips with Antonio [name] on weekends, e.g., winery visits, flea markets.  Son took him out for a meal recently, daughter another night.  Gets out with "[employer]", 4-5 guys since the late 70s, originally met in a photography course at [facility].  Founder of the group, Antonio [name], died early in [place], and another man just dx'd Antonio [name].    Transvestism does not interest him any more, so kind of a non-issue for him.  Offer made, if Antonio [name] wishes, to discuss here and interpret.  Possibly also his 53-year habit of collecting knives, and "underhanded stuff" he's done with them -- 40ish years ago going to knife shows and buying knives against Antonio [name] wishes.  Currently has about 100 handmade knives in his collection, no real interest any more but of some value.  Discussed steps he may need to take, and reduced expectations probably, to successfully liquidate.    Therapeutic modalities: Cognitive Behavioral Therapy, Solution-Oriented/Positive Psychology, and Ego-Supportive  Mental Status/Observations:  Appearance:   Casual     Behavior:  Appropriate  Motor:  Normal  Speech/Language:   Clear and Coherent   Affect:  Appropriate  Mood:  dysthymic  Thought process:  normal  Thought content:    WNL  Sensory/Perceptual disturbances:    WNL  Orientation:  Fully oriented  Attention:  Good    Concentration:  Good  Memory:  WNL  Insight:    Good  Judgment:   Good  Impulse Control:  Good   Risk Assessment: Danger to Self: No Self-injurious Behavior: No Danger to Others: No Physical Aggression / Violence: No Duty to Warn: No Access to Firearms a concern: No  Assessment of progress:  progressing  Diagnosis:   ICD-10-CM   1. Major depressive disorder, recurrent episode, moderate (HCC)  F33.1     2. 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 Sleep disorder, circadian, delayed sleep phase type  G47.21     4. Obstructive sleep apnea syndrome  G47.33     5. 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.1      Plan:  Assess knife collection for reduction/liquidation Continue to communicate fairly/openly with Antonio [name], ask don't assume attitudes Open to joint session to confirm and process marital hurts as needed Continue activity, exercise, and nutrition efforts to improve mood and post-COVID mental function Other recommendations/advice as may be noted above Continue to utilize previously learned skills ad lib Maintain medication as prescribed and work faithfully with relevant prescriber(s) if any changes are desired or seem indicated Call the clinic on-call service, present to ER, or call 911 if any life-threatening psychiatric crisis Return in about 1 month (  around 10/04/2020) for time at discretion. Already scheduled visit in this office 09/30/2020.  Antonio Serve, PhD Antonio Moore, PhD LP Clinical Psychologist, [facility] [address] [place], [place] [address] [phone]

## 2020-09-30 ENCOUNTER — Ambulatory Visit (INDEPENDENT_AMBULATORY_CARE_PROVIDER_SITE_OTHER): Payer: Medicare Other | Admitting: Physician Assistant

## 2020-09-30 ENCOUNTER — Encounter: Payer: Self-pay | Admitting: Physician Assistant

## 2020-09-30 ENCOUNTER — Other Ambulatory Visit: Payer: Self-pay

## 2020-09-30 DIAGNOSIS — U099 Post covid-19 condition, unspecified: Secondary | ICD-10-CM

## 2020-09-30 DIAGNOSIS — G4733 Obstructive sleep apnea (adult) (pediatric): Secondary | ICD-10-CM

## 2020-09-30 DIAGNOSIS — R2990 Unspecified symptoms and signs involving the nervous system: Secondary | ICD-10-CM

## 2020-09-30 DIAGNOSIS — R5382 Chronic fatigue, unspecified: Secondary | ICD-10-CM | POA: Diagnosis not present

## 2020-09-30 DIAGNOSIS — F4321 Adjustment disorder with depressed mood: Secondary | ICD-10-CM | POA: Diagnosis not present

## 2020-09-30 NOTE — Progress Notes (Signed)
[facility]  Patient ID: Antonio [name],  MRN: [id number]  PCP: Leeroy Cha, MD  Date of Evaluation: 09/30/2020 Time spent:30 minutes  Chief Complaint:  Chief Complaint   Follow-up; Depression      HISTORY/CURRENT STATUS: For routine med check.  Not doing well since having COVID.  Antonio [name] is here for 3-monthmed check.  At the last visit he told me about several illnesses that he had had in the few months prior.  He has had no other acute illnesses but is unable to get over [place]  The worst part is brain fog and fatigue.  Does not have any energy to do much of anything.  He sits on the couch and watches TV most of the time.  States he is a little depressed but he does not want to take any meds for it at this point.  Feels like it is circumstantial.  He will be in the middle of saying something and then forget what he was going to say.  Or there are times when he can see a person in his mind that he may be referring to but he cannot come up with their name.  Same thing happens with an object or place he has been.  It may take him minutes to a day or so until he is able to connect the dots.  He describes it as a "short circuit."  That has been very difficult.  His appetite is normal and weight is stable.  He is able to take care of their dogs as needed.  Several of his foster dogs have a cough, some sort of respiratory infection.  He is concerned that the other dogs may get it.  He sleeps well.  He does use his CPAP religiously.  No anxiety for the most part.  He denies suicidal or homicidal thoughts.  Review of Systems  Constitutional:  Positive for malaise/fatigue.  HENT: Negative.    Eyes: Negative.   Respiratory: Negative.    Cardiovascular: Negative.   Gastrointestinal: Negative.   Genitourinary: Negative.   Musculoskeletal: Negative.   Skin: Negative.   Neurological: Negative.   Endo/Heme/Allergies: Negative.   Psychiatric/Behavioral:  Positive for memory  loss.        See HPI   Individual Medical History/ Review of Systems: Changes? :Yes   see HPI  Past medications for mental health diagnoses include: Wellbutrin SR Wellbutrin XL, Zoloft caused falls, Cymbalta  Allergies: Metformin and related, Wellbutrin [bupropion], and Zoloft [sertraline]  Current Medications:  Current Outpatient Medications:    amLODipine (NORVASC) 5 MG tablet, Take 1 tablet by mouth daily., Disp: , Rfl:    atorvastatin (LIPITOR) 10 MG tablet, , Disp: , Rfl:    cholecalciferol (VITAMIN D) 1000 units tablet, Take 1,000 Units by mouth in the morning and at bedtime. , Disp: , Rfl:    ELDERBERRY PO, Take 2 each by mouth daily. Gummies, Disp: , Rfl:    famotidine (PEPCID) 10 MG tablet, Take 10 mg by mouth daily. , Disp: , Rfl:    ferrous sulfate 325 (65 FE) MG tablet, Take 325 mg by mouth daily with breakfast., Disp: , Rfl:    potassium chloride SA (K-DUR,KLOR-CON) 20 MEQ tablet, Take 20 mEq by mouth daily. , Disp: , Rfl: 3   sildenafil (REVATIO) 20 MG tablet, , Disp: , Rfl:    Turmeric Curcumin 500 MG CAPS, , Disp: , Rfl:    acetaminophen (TYLENOL) 500 MG tablet, , Disp: , Rfl:  empagliflozin (JARDIANCE) 25 MG TABS tablet, Take 1 tablet (25 mg total) by mouth daily before breakfast. (Patient not taking: Reported on 06/30/2020), Disp: 30 tablet, Rfl: 11   lamoTRIgine (LAMICTAL) 25 MG tablet, TAKE 1 TABLET BY MOUTH  DAILY (Patient not taking: No sig reported), Disp: 90 tablet, Rfl: 3   Tamsulosin HCl (FLOMAX) 0.4 MG CAPS, Take 1 capsule (0.4 mg total) by mouth daily after breakfast. (Patient not taking: No sig reported), Disp: 30 capsule, Rfl: 0   traMADol (ULTRAM) 50 MG tablet, Take 1-2 tablets (50-100 mg total) by mouth every 6 (six) hours as needed for moderate pain or severe pain. (Patient not taking: No sig reported), Disp: 20 tablet, Rfl: 0 Medication Side Effects: none  Family Medical/ Social History: Changes? No  MENTAL HEALTH EXAM:  There were no vitals taken for  this visit.There is no height or weight on file to calculate BMI.  General Appearance: Casual and Well Groomed  Eye Contact:  Good  Speech:  Clear and Coherent  Volume:  Normal  Mood:  Euthymic  Affect:  Congruent  Thought Process:  Goal Directed and Descriptions of Associations: Intact  Orientation:  Full (Time, Place, and Person)  Thought Content: Logical   Suicidal Thoughts:  No  Homicidal Thoughts:  No  Memory:  Immediate;   Fair Remote;   Good  Judgement:  Good  Insight:  Good  Psychomotor Activity:  Normal  Concentration:  Concentration: Good  Recall:  Good  Fund of Knowledge: Good  Language: Good  Assets:  Desire for Improvement  ADL's:  Intact  Cognition: WNL  Prognosis:  Good    DIAGNOSES:    ICD-10-CM   1. Post-COVID chronic neurologic symptoms  R29.90    U09.9     2. Obstructive sleep apnea syndrome  G47.33     3. Situational depression  F43.21     4. 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 Jonni Sanger Mitchum   RECOMMENDATIONS:  PDMP was reviewed.  No results available. I provided 30 minutes of face to face time during this encounter, including time spent before and after the visit in records review, medical decision making, and charting.  We discussed the chronic post COVID symptoms.  Recommend he see his PCP to make sure nothing else is going on.  He does take his iron regularly and uses his CPAP as directed. There are numerous recommendations for COVID brain fog and fatigue, but obviously no one is certain what works and what does not.  I do think N-acetylcysteine helps in some patients with mild brain fog/inability to come up with words quickly or the like, not necessarily related to COVID but hopefully it would help that as well.  Another recommendation is a supplement called Luteolin.  I wrote those recommendations down so he can discuss this with his wife.  She usually shops for  things like that.  Hopefully it will help. We agree not to restart an antidepressant.  If the depression worsens then he should call before the next appointment and we will decide the best course of action at that point. Start NAC 600 mg, 1 p.o. twice daily. Start Luteolin 100 mg, 2 p.o. daily. Continue CPAP use. Continue therapy with Dr. [name]. Return in 2 months.  [name], PA-C

## 2020-10-08 ENCOUNTER — Ambulatory Visit (INDEPENDENT_AMBULATORY_CARE_PROVIDER_SITE_OTHER): Payer: Medicare Other | Admitting: Psychiatry

## 2020-10-08 ENCOUNTER — Other Ambulatory Visit: Payer: Self-pay

## 2020-10-08 DIAGNOSIS — U099 Post covid-19 condition, unspecified: Secondary | ICD-10-CM

## 2020-10-08 DIAGNOSIS — R5382 Chronic fatigue, unspecified: Secondary | ICD-10-CM

## 2020-10-08 DIAGNOSIS — F331 Major depressive disorder, recurrent, moderate: Secondary | ICD-10-CM | POA: Diagnosis not present

## 2020-10-08 DIAGNOSIS — G4733 Obstructive sleep apnea (adult) (pediatric): Secondary | ICD-10-CM | POA: Diagnosis not present

## 2020-10-08 NOTE — Progress Notes (Signed)
Psychotherapy Progress Note [facility] Antonio Moore, PhD LP  Patient ID: Antonio [name]     MRN: [id number] Therapy format: Individual psychotherapy Date: 10/08/2020      Start: 11:16a     Stop: 12:06p     Time Spent: 50 min Location: In-person   Session narrative (presenting needs, interim history, self-report of stressors and symptoms, applications of prior therapy, status changes, and interventions made in session) Doing OK, same as last month.  Has appt scheduled with neurologist, Wells Guiles Tat, DO, to try to deal with fatigue and brain fog.  Feels weak, like he's falling apart, attributed to post-COVID syndrome, PCP said it will just go away, but it's not.  Assured that a DO approach will be more affirming and active, most likely.  Saw psychiatry last week, offered medication but declined.  Clarified that finding reasons and doing something about fatigue can be completely separate.  Probed likely physiological causes, does not sem to be about breathing, as no complaints of asthmatic or COPD sxs and did not experience lung infection with COVID.  Possibly circulatory, possibly deconditioning.  Tried a 30-second hyperventilation challenge, with no ill effects or sense of energizing.  Noted not to actually breathe that hard when he di, though.  Tried breath-holding, made it 38 seconds, also with no ill effects or energizing, though both of them did create noticeable feelings, just not head-clearing.  Otherwise, c/o the fatigue, and of being down to one car and less mobile, and of government interference (vaccines) and incompetence (border security), and of the news they tend to watch Hassell Done, with wife saying aloud "Liar!" fairly often, and [name] just not watching so much), and of bias and decline in the local paper.   Acknowledges if he has any causes he cares about in the world, it will feel better to do something in support of it than it will to just complain and sit.  Meanwhile,  encouraged to approach healing from COVID from the perspective of stimulating his own healing processes, and if it's circulation, let's focus on movement.  Discussed possible forms of exertion, walking the handiest.  Resolved to get out from his house and walk up the hill (sidewalk) on [address] [employer] to [place] [employer].  With or without dogs, just give it a try, get up the hill, see how it goes, and trust that any stimulation is doing some healing compared to sitting around deconditioning.  Agrees, starts to talk about yard work he has to do, like a couple of dead plants to dig up, and how that might put him down for a couple hours if he does.  Encouraged to try one piece of digging and see how it works for his body, use the limit-testing spirit he had in his youth to push the envelope on his current stamina.   For attitude's sake, agree that complaining without doing something is part of the problem, and that whatever he feels "have to about" he can change to "let's see".    Briefly covered vitamin situation -- he normally takes a low dose vitamin D (1000 IU) and B12 but got out of the habit of loading his vitamin box some time back.  Encouraged to reload and consider upping vitamin D as much as 5000 IU, with caveat that testing may be recommended to assess and gauge treatment.  Therapeutic modalities: Cognitive Behavioral Therapy, Solution-Oriented/Positive Psychology, and Psycho-education/Bibliotherapy  Mental Status/Observations:  Appearance:   Casual  Behavior:  Appropriate  Motor:  stiff  Speech/Language:   Clear and Coherent  Affect:  Appropriate  Mood:  dysthymic and responsive, wry humor  Thought process:  normal  Thought content:    preoccupation  Sensory/Perceptual disturbances:    WNL  Orientation:  Fully oriented  Attention:  Good    Concentration:  Fair  Memory:  grossly intact  Insight:    Fair  Judgment:   Good  Impulse Control:  Fair   Risk Assessment: Danger to  Self: No Self-injurious Behavior: No Danger to Others: No Physical Aggression / Violence: No Duty to Warn: No Access to Firearms a concern: No  Assessment of progress:  stabilized  Diagnosis:   ICD-10-CM   1. Major depressive disorder, recurrent episode, moderate (HCC)  F33.1     2. 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 Obstructive sleep apnea syndrome  G47.33      Plan:  Activity -- agreed to start walking the hill in front of his house (with or without his dogs), today, and call back to say how it went.  Encouragement to make the trip daily, trust it stimulates stamina, circulation, and healing.  May try to dig up a dead plant in his yard.   Nutrition -- agreed to reload B12 and D and start taking them again faithfully, possibly push up D beyond 1000 IU, option up to 5000 IU, may test if desired through PCP [facility]) Attitude -- emphasize "care to" over "have to", "can do" over "can't do", "let's see" over cynical predictions, "working on it" over "not working", and if going to complain, take action, if you're not going to take action, shorten up complaining Sleep quality -- maintain CPAP  Other recommendations/advice as may be noted above Continue to utilize previously learned skills ad lib Maintain medication as prescribed and work faithfully with relevant prescriber(s) if any changes are desired or seem indicated Call the clinic on-call service, 988/hotline, present to ER, or call 911 if any life-threatening psychiatric crisis Return for time as available. Already scheduled visit in this office 12/03/2020.  Blanchie Serve, PhD Antonio Moore, PhD LP Clinical Psychologist, [facility] [address] [place], [place] [address] [phone]

## 2020-11-23 ENCOUNTER — Ambulatory Visit: Payer: Medicare Other | Admitting: Psychiatry

## 2020-12-03 ENCOUNTER — Encounter: Payer: Self-pay | Admitting: Physician Assistant

## 2020-12-03 ENCOUNTER — Other Ambulatory Visit: Payer: Self-pay

## 2020-12-03 ENCOUNTER — Ambulatory Visit (INDEPENDENT_AMBULATORY_CARE_PROVIDER_SITE_OTHER): Payer: Medicare Other | Admitting: Physician Assistant

## 2020-12-03 DIAGNOSIS — R2990 Unspecified symptoms and signs involving the nervous system: Secondary | ICD-10-CM

## 2020-12-03 DIAGNOSIS — F4321 Adjustment disorder with depressed mood: Secondary | ICD-10-CM | POA: Diagnosis not present

## 2020-12-03 DIAGNOSIS — U099 Post covid-19 condition, unspecified: Secondary | ICD-10-CM

## 2020-12-03 NOTE — Progress Notes (Signed)
[facility]  Patient ID: Antonio [name],  MRN: [id number]  PCP: Leeroy Cha, MD  Date of Evaluation: 12/03/2020 Time spent:30 minutes  Chief Complaint:  Chief Complaint   Follow-up      HISTORY/CURRENT STATUS: For routine med check.   Since he had COVID 6 months ago, he feels like he has short circuit, can see things in his mind but can't come up with the word right away, feels like his head has cotton in it. His wife has asked about naltrexone, has read about it, does it work for post COVID foggy headedness and fatigue.  About 6 weeks ago I recommended he start NAC and Luteolin which he has taken religiously but it has not helped with the brain fog.  That is the worst part of the symptoms.    He is able to do the things he needs or wants to do like take care of of their foster [place] terrier's.  He feels a little depressed but only because of the symptoms.  It gets very frustrating and embarrassing.  He sleeps well and not too much at least he does not think so.  He does not cry easily.  Personal hygiene is normal.  Energy and motivation are normal at his baseline.  Appetite is normal and weight is stable.  No suicidal or homicidal thoughts.  Patient denies increased energy with decreased need for sleep, no increased talkativeness, no racing thoughts, no impulsivity or risky behaviors, no increased spending, no increased libido, no grandiosity, no increased irritability or anger, and no hallucinations.  Denies dizziness, syncope, seizures, numbness, tingling, tremor, tics, unsteady gait, slurred speech, confusion. Denies muscle or joint pain, stiffness, or dystonia.  Individual Medical History/ Review of Systems: Changes? :Yes   see HPI  Past medications for mental health diagnoses include: Wellbutrin SR Wellbutrin XL, Zoloft caused falls, Cymbalta  Allergies: Metformin and related, Wellbutrin [bupropion], and Zoloft [sertraline]  Current  Medications:  Current Outpatient Medications:    acetaminophen (TYLENOL) 500 MG tablet, , Disp: , Rfl:    amLODipine (NORVASC) 5 MG tablet, Take 1 tablet by mouth daily., Disp: , Rfl:    atorvastatin (LIPITOR) 10 MG tablet, , Disp: , Rfl:    cholecalciferol (VITAMIN D) 1000 units tablet, Take 1,000 Units by mouth in the morning and at bedtime. , Disp: , Rfl:    ELDERBERRY PO, Take 2 each by mouth daily. Gummies, Disp: , Rfl:    famotidine (PEPCID) 10 MG tablet, Take 10 mg by mouth daily. , Disp: , Rfl:    ferrous sulfate 325 (65 FE) MG tablet, Take 325 mg by mouth daily with breakfast., Disp: , Rfl:    potassium chloride SA (K-DUR,KLOR-CON) 20 MEQ tablet, Take 20 mEq by mouth daily. , Disp: , Rfl: 3   sildenafil (REVATIO) 20 MG tablet, , Disp: , Rfl:    Turmeric Curcumin 500 MG CAPS, , Disp: , Rfl:    empagliflozin (JARDIANCE) 25 MG TABS tablet, Take 1 tablet (25 mg total) by mouth daily before breakfast. (Patient not taking: Reported on 06/30/2020), Disp: 30 tablet, Rfl: 11   lamoTRIgine (LAMICTAL) 25 MG tablet, TAKE 1 TABLET BY MOUTH  DAILY, Disp: 90 tablet, Rfl: 3   Naltrexone POWD, Take 1.5 mg by mouth daily., Disp: 250 g, Rfl: 0   Tamsulosin HCl (FLOMAX) 0.4 MG CAPS, Take 1 capsule (0.4 mg total) by mouth daily after breakfast. (Patient not taking: No sig reported), Disp: 30 capsule, Rfl: 0   traMADol (ULTRAM)  50 MG tablet, Take 1-2 tablets (50-100 mg total) by mouth every 6 (six) hours as needed for moderate pain or severe pain. (Patient not taking: No sig reported), Disp: 20 tablet, Rfl: 0 Medication Side Effects: none  Family Medical/ Social History: Changes? His best friend committed suicide, known him for 57 years.   MENTAL HEALTH EXAM:  There were no vitals taken for this visit.There is no height or weight on file to calculate BMI.  General Appearance: Casual and Well Groomed  Eye Contact:  Good  Speech:  Clear and Coherent  Volume:  Normal  Mood:  Euthymic  Affect:  Congruent   Thought Process:  Goal Directed and Descriptions of Associations: Intact  Orientation:  Full (Time, Place, and Person)  Thought Content: Logical   Suicidal Thoughts:  No  Homicidal Thoughts:  No  Memory:  Immediate;   Fair Remote;   Good  Judgement:  Good  Insight:  Good  Psychomotor Activity:  Normal  Concentration:  Concentration: Good  Recall:  Good  Fund of Knowledge: Good  Language: Good  Assets:  Desire for Improvement  ADL's:  Intact  Cognition: WNL  Prognosis:  Good   Mini-Mental status exam 29/30 Animal naming test 20/60 seconds   DIAGNOSES:    ICD-10-CM   1. Post-COVID chronic neurologic symptoms  R29.90    U09.9     2. Situational depression  F43.21          Receiving Psychotherapy: Yes   Dr. Jonni Sanger Mitchum   RECOMMENDATIONS:  PDMP was reviewed.  No results available. I provided 30 minutes of face to face time during this encounter, including time spent before and after the visit in records review, medical decision making, counseling pertinent to today's visit, and charting.  We discussed the naltrexone.  There are studies that show possible improvement with cognition with low-dose naltrexone.  We discussed the benefits, risks and side effects and he would like to try it.  Because it is such a low dose and we will have she did it from a compounding pharmacy. Start naltrexone 1.5 mg, p.o. every morning for 2 weeks and then can increase to 3 mg.  [facility]. Discontinue lutein and NAC due to not effective.   Continue CPAP use. Continue therapy with Dr. [name]. Return in 6 weeks.  [name], PA-C

## 2020-12-09 ENCOUNTER — Telehealth: Payer: Self-pay | Admitting: Physician Assistant

## 2020-12-09 MED ORDER — NALTREXONE POWD: 1.5000 mg | Freq: Every day | 0 refills | Status: DC

## 2020-12-09 NOTE — Telephone Encounter (Signed)
Pt notified of medication and that [facility] will notify him when it's ready

## 2020-12-09 NOTE — Telephone Encounter (Signed)
Please call him about the naltrexone, for post COVID brain fog.  It is recommended to start at 1.5 mg daily, take that for 2 to 3 weeks and then we can increase if he is no better.  [facility], it may take a few days for it to be compounded. Thanks

## 2020-12-09 NOTE — Telephone Encounter (Signed)
Rx was called into [facility]

## 2020-12-31 ENCOUNTER — Ambulatory Visit (INDEPENDENT_AMBULATORY_CARE_PROVIDER_SITE_OTHER): Payer: Medicare Other | Admitting: Psychiatry

## 2020-12-31 ENCOUNTER — Other Ambulatory Visit: Payer: Self-pay

## 2020-12-31 DIAGNOSIS — F331 Major depressive disorder, recurrent, moderate: Secondary | ICD-10-CM

## 2020-12-31 DIAGNOSIS — G9332 Myalgic encephalomyelitis/chronic fatigue syndrome: Secondary | ICD-10-CM

## 2020-12-31 DIAGNOSIS — U099 Post covid-19 condition, unspecified: Secondary | ICD-10-CM

## 2020-12-31 DIAGNOSIS — F5104 Psychophysiologic insomnia: Secondary | ICD-10-CM | POA: Diagnosis not present

## 2020-12-31 DIAGNOSIS — Z630 Problems in relationship with spouse or partner: Secondary | ICD-10-CM | POA: Diagnosis not present

## 2020-12-31 NOTE — Progress Notes (Signed)
Psychotherapy Progress Note [facility] Antonio Moore, PhD LP  Patient ID: Antonio [name] [facility] [name])    MRN: [id number] Therapy format: Family therapy w/ patient -- accompanied by Antonio [name] Date: 12/31/2020      Start: 3:20p     Stop: 4:12p     Time Spent: 52 min Location: In-person   Session narrative (presenting needs, interim history, self-report of stressors and symptoms, applications of prior therapy, status changes, and interventions made in session) Here with wife Antonio [name].  Says Antonio [name] found information online that seems to fit him (depressed, passive-aggressive).  Asked why, notes how he used to cower with his ex-wife and not stand up for himself.  Antonio [name] notes the passive-aggressive part is his not answering questions, keeping to himself some, not initiating much interaction, and eventually the lack of intimacy for well over a decade now.  Acknowledges he does kind of resent being sent information about psychology, feels pressured sometimes, but mostly just feels awkward, not sure how to talk about his feelings.  (EHR notes diabetes, ED, kidney disease, sleep apnea, back pain, anemia, and vitamin D deficiency, none mentioned by patient, plus his privately revealed hx of transvestic fetish -- all of which could, in addition to depression, inhibit both functioning and desire.)  Reviewing how things go, he tends to wake up intimidated, daily pain in neck and shoulder, still irritated by post-COVID cognitive effects.  Per [place], retirement in 2007 changed him, lost his drive and focus, no longer cleaned up house, closed in.  Dog rescue started 2001, and they still maintain several fosters.  Antonio [name] claims, and Antonio [name] acknowledges, he takes a lot more time than she does accomplishing feeding and daily maintenance tasks with them.  These days he has routinely short sleep, c. 1am to maybe 8a and conks out for the first half of the evening.  Problem-solved dog care schedule and tasks  with an eye to free up time together.  Encouraged making a habit of going to bed before conking out in living room for better quality sleep.  Probed lack of touch, intimacy, and his awkward feelings, coached to merely hold hands in session and see how it feels.  Therapeutic modalities: Cognitive Behavioral Therapy, Solution-Oriented/Positive Psychology, and Assertiveness/Communication  Mental Status/Observations:  Appearance:   Casual     Behavior:  Appropriate  Motor:  Normal and stiff, consistent with age  Speech/Language:   Clear and Coherent  Affect:  Appropriate  Mood:  dysthymic  Thought process:  normal  Thought content:    WNL  Sensory/Perceptual disturbances:    WNL  Orientation:  Fully oriented  Attention:  Good    Concentration:  Good  Memory:  WNL  Insight:    Fair  Judgment:   Good  Impulse Control:  Fair   Risk Assessment: Danger to Self: No Self-injurious Behavior: No Danger to Others: No Physical Aggression / Violence: No Duty to Warn: No Access to Firearms a concern: No  Assessment of progress:  stabilized  Diagnosis:   ICD-10-CM   1. Major depressive disorder, recurrent episode, moderate (HCC)  F33.1     2. 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 Relationship problem between partners  Z63.0     4. 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      Plan:  Further work out dog care schedule so as to make some time available for each other Practice going to bed before falling asleep  in living room Practice intentional touch Build walking habit as tolerated for multiple benefits to cognition, health, sleep Assess treatment of sleep apnea Other recommendations/advice as may be noted above Continue to utilize previously learned skills ad lib Maintain medication as prescribed and work faithfully with relevant prescriber(s) if any changes are desired or seem indicated Call the clinic on-call service, 988/hotline, 911, or  present to [facility] or ER if any life-threatening psychiatric crisis Return for time as available -- prefer Friday, time as available. Already scheduled visit in this office 01/28/2021.  Antonio Serve, PhD Antonio Moore, PhD LP Clinical Psychologist, [facility] [address], [place], [place] [address] [phone]

## 2021-01-28 ENCOUNTER — Encounter: Payer: Self-pay | Admitting: Physician Assistant

## 2021-01-28 ENCOUNTER — Other Ambulatory Visit: Payer: Self-pay

## 2021-01-28 ENCOUNTER — Ambulatory Visit: Payer: Medicare Other | Admitting: Physician Assistant

## 2021-01-28 DIAGNOSIS — U099 Post covid-19 condition, unspecified: Secondary | ICD-10-CM | POA: Diagnosis not present

## 2021-01-28 DIAGNOSIS — F32A Depression, unspecified: Secondary | ICD-10-CM | POA: Diagnosis not present

## 2021-01-28 DIAGNOSIS — R2990 Unspecified symptoms and signs involving the nervous system: Secondary | ICD-10-CM

## 2021-01-28 DIAGNOSIS — G4733 Obstructive sleep apnea (adult) (pediatric): Secondary | ICD-10-CM | POA: Diagnosis not present

## 2021-01-28 MED ORDER — LAMOTRIGINE 25 MG PO TABS: ORAL_TABLET | ORAL | 1 refills | Status: DC

## 2021-01-28 MED ORDER — NALTREXONE POWD: 1.5000 mg | Freq: Every day | 2 refills | Status: DC

## 2021-01-28 NOTE — Progress Notes (Signed)
[facility]  Patient ID: Antonio [name],  MRN: [id number]  PCP: Leeroy Cha, MD  Date of Evaluation: 01/28/2021 Time spent:20 minutes  Chief Complaint:  Chief Complaint   Follow-up       HISTORY/CURRENT STATUS: For routine med check.   Took the Naltrexone for post COVID brain fog but only took it for a month and he did not see that it was helping so he stopped it.  He still forgets a lot of things and cannot come up with words.  His PCP told him this symptom could be coming from depression.  Antonio [name] has been depressed for years.  He has tried several different antidepressants and they either did not work or he just wanted to stop them.  Most recently he was on Lamictal and in June of last year he wanted to stop all psychiatric medications because he felt like he did not need them.  He does continue to have irritability which is a common symptom.  He does not want to be bothered with other people, does not enjoy much of anything.  Energy and motivation are low.  He is able to take care of their [place] rescue dogs.  Sleeps a lot.  Part of it is out of boredom.  Not having a lot of anxiety.  No suicidal or homicidal thoughts.  Patient denies increased energy with decreased need for sleep, no increased talkativeness, no racing thoughts, no impulsivity or risky behaviors, no increased spending, no increased libido, no grandiosity, no increased irritability or anger, and no hallucinations.  Denies dizziness, syncope, seizures, numbness, tingling, tremor, tics, unsteady gait, slurred speech, confusion. Denies muscle or joint pain, stiffness, or dystonia.  Individual Medical History/ Review of Systems: Changes? :Yes   see HPI  Past medications for mental health diagnoses include: Wellbutrin SR Wellbutrin XL, Zoloft caused falls, Cymbalta  Allergies: Metformin and related, Wellbutrin [bupropion], and Zoloft [sertraline]  Current Medications:  Current Outpatient  Medications:    acetaminophen (TYLENOL) 500 MG tablet, , Disp: , Rfl:    amLODipine (NORVASC) 5 MG tablet, Take 1 tablet by mouth daily., Disp: , Rfl:    atorvastatin (LIPITOR) 10 MG tablet, , Disp: , Rfl:    cholecalciferol (VITAMIN D) 1000 units tablet, Take 1,000 Units by mouth in the morning and at bedtime. , Disp: , Rfl:    ELDERBERRY PO, Take 2 each by mouth daily. Gummies, Disp: , Rfl:    famotidine (PEPCID) 10 MG tablet, Take 10 mg by mouth daily. , Disp: , Rfl:    ferrous sulfate 325 (65 FE) MG tablet, Take 325 mg by mouth daily with breakfast., Disp: , Rfl:    lamoTRIgine (LAMICTAL) 25 MG tablet, 1 po qhs for 2 weeks, then 2 po qhs., Disp: 60 tablet, Rfl: 1   potassium chloride SA (K-DUR,KLOR-CON) 20 MEQ tablet, Take 20 mEq by mouth daily. , Disp: , Rfl: 3   sildenafil (REVATIO) 20 MG tablet, , Disp: , Rfl:    empagliflozin (JARDIANCE) 25 MG TABS tablet, Take 1 tablet (25 mg total) by mouth daily before breakfast. (Patient not taking: Reported on 06/30/2020), Disp: 30 tablet, Rfl: 11   Naltrexone POWD, Take 1.5 mg by mouth daily., Disp: 250 g, Rfl: 2   Tamsulosin HCl (FLOMAX) 0.4 MG CAPS, Take 1 capsule (0.4 mg total) by mouth daily after breakfast. (Patient not taking: Reported on 06/30/2020), Disp: 30 capsule, Rfl: 0   traMADol (ULTRAM) 50 MG tablet, Take 1-2 tablets (50-100 mg total) by mouth  every 6 (six) hours as needed for moderate pain or severe pain. (Patient not taking: Reported on 06/30/2020), Disp: 20 tablet, Rfl: 0   Turmeric Curcumin 500 MG CAPS, , Disp: , Rfl:  Medication Side Effects: none  Family Medical/ Social History: Changes?   MENTAL HEALTH EXAM:  There were no vitals taken for this visit.There is no height or weight on file to calculate BMI.  General Appearance: Casual and Well Groomed  Eye Contact:  Good  Speech:  Clear and Coherent and Normal Rate  Volume:  Normal  Mood:  Euthymic  Affect:  Congruent  Thought Process:  Goal Directed and Descriptions of  Associations: Intact  Orientation:  Full (Time, Place, and Person)  Thought Content: Logical   Suicidal Thoughts:  No  Homicidal Thoughts:  No  Memory:  Immediate;   Fair Remote;   Good  Judgement:  Good  Insight:  Good  Psychomotor Activity:  Normal  Concentration:  Concentration: Good  Recall:  Good  Fund of Knowledge: Good  Language: Good  Assets:  Desire for Improvement  ADL's:  Intact  Cognition: WNL  Prognosis:  Good     DIAGNOSES:    ICD-10-CM   1. Post-COVID chronic neurologic symptoms  R29.90    U09.9     2. Depression, unspecified depression type  F32.A     3. Obstructive sleep apnea syndrome  G47.33         Receiving Psychotherapy: Yes   Dr. Jonni Sanger Mitchum   RECOMMENDATIONS:  PDMP was reviewed.  No results available. I provided 20 minutes of face to face time during this encounter, including time spent before and after the visit in records review, medical decision making, counseling pertinent to today's visit, and charting.  We discussed the naltrexone.  It is being used off label to help treat post COVID brain fog, and we do not really know how long someone needs to stay on it.  I would recommend at least a total of 2 to 3 months, closer to 3 as long as it has not caused any side effects, which he did not and him.  He agrees to restart it and take it for at least 2 more months. I agree that depression can also cause brain fog and memory issues.  He has not wanted to take an antidepressant but after reviewing his chart I see that he did respond well to a very low dose of Lamictal.  Recommend restarting that. Counseled patient regarding potential benefits, risks, and side effects of Lamictal to include potential risk of Stevens-Johnson syndrome. Advised patient to stop taking Lamictal and contact office immediately if rash develops and to seek urgent medical attention if rash is severe and/or spreading quickly.  Patient understands and accepts these risks. Restart  Lamictal 25 mg, 1 p.o. nightly daily for 2 weeks and then 2 p.o. nightly. Restart naltrexone 1.5 mg, p.o. every morning for 2 weeks and then can increase to 3 mg.  [facility]. Continue CPAP use. Continue therapy with Dr. [name]. Return in 4-6 weeks.  [name], PA-C

## 2021-02-18 ENCOUNTER — Ambulatory Visit (INDEPENDENT_AMBULATORY_CARE_PROVIDER_SITE_OTHER): Payer: Medicare Other | Admitting: Psychiatry

## 2021-02-18 ENCOUNTER — Other Ambulatory Visit: Payer: Self-pay

## 2021-02-18 DIAGNOSIS — F331 Major depressive disorder, recurrent, moderate: Secondary | ICD-10-CM | POA: Diagnosis not present

## 2021-02-18 DIAGNOSIS — Z630 Problems in relationship with spouse or partner: Secondary | ICD-10-CM

## 2021-02-18 DIAGNOSIS — G9332 Myalgic encephalomyelitis/chronic fatigue syndrome: Secondary | ICD-10-CM

## 2021-02-18 DIAGNOSIS — U099 Post covid-19 condition, unspecified: Secondary | ICD-10-CM | POA: Diagnosis not present

## 2021-02-18 NOTE — Progress Notes (Signed)
Psychotherapy Progress Note [facility] Antonio Moore, PhD LP  Patient ID: Antonio Antonio [name] [facility] [name])    MRN: [id number] Therapy format: Family therapy w/ patient -- accompanied by Antonio Antonio [name] Date: 02/18/2021      Start: 10:14a     Stop: 11:04a     Time Spent: 50 min Location: In-person   Session narrative (presenting needs, interim history, self-report of stressors and symptoms, applications of prior therapy, status changes, and interventions made in session) Been a little better, cut back his yelling.  Picking up some more of the dog care to relieve Antonio Antonio [name].  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  Personally Antonio Antonio [name] c/o up and down days, worst days mentally foggy until near 5pm.  Attributes to Antonio Antonio [name], though he has gone a long time with this, and some predates.    Re. Issues last time, he has reduced the long time he was spending feeding the dogs and going back and forth between one thing and another.  Supposedly, one dog has to have a pill at midnight (?) and then Antonio Antonio [name] tends to stay up on Facebook.  Reaffirmed that he is interested in correcting this and joining Antonio Antonio [name] more.  Interpreted circadian effects of blue light and set his phone display to eye comfort on schedule.  Confirmed spending less time working dogs at night, encouraged stay efficient with it, not switch tasks.  With Antonio Antonio [name], agreed they need unbroken quality time, without her phone or his getting up and down.  They do enjoy a couple shows together, worked out an idea for dedicated time tomorrow.  Discussed lack of friendships and options to revive some available.  Therapeutic modalities: Cognitive Behavioral Therapy, Solution-Oriented/Positive Psychology, and Assertiveness/Communication  Mental Status/Observations:  Appearance:   Casual      Behavior:  Appropriate  Motor:  Normal  Speech/Language:   Clear and Coherent  Affect:  Appropriate  Mood:  Mildly anxious  Thought process:  normal  Thought content:    WNL  Sensory/Perceptual disturbances:    WNL  Orientation:  Fully oriented  Attention:  Good    Concentration:  Good  Memory:  WNL  Insight:    Fair  Judgment:   Good  Impulse Control:  Fair   Risk Assessment: Danger to Self: No Self-injurious Behavior: No Danger to Others: No Physical Aggression / Violence: No Duty to Warn: No Access to Firearms a concern: No  Assessment of progress:  progressing  Diagnosis:   ICD-10-CM   1. Major depressive disorder, recurrent episode, moderate (HCC)  F33.1     2. Relationship problem between partners  Z63.0     3. 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  Sit next to each other, in touch to foster intimacy.. Continue working dog care in one chunk, reduce time dithering Use lower light, electronics curfew to trim back late start to sleep Other recommendations/advice as may be noted above Continue to utilize previously learned skills ad lib Maintain medication as prescribed and work faithfully with relevant prescriber(s) if any changes are desired or seem indicated Call  the clinic on-call service, 988/hotline, 911, or present to [facility] or ER if any life-threatening psychiatric crisis Return for session(s) already scheduled. Already scheduled visit in this office 03/03/2021.  Antonio Serve, PhD Antonio Moore, PhD LP Clinical Psychologist, [facility] [address] [place], [place] [address] [phone]

## 2021-02-28 ENCOUNTER — Ambulatory Visit: Payer: Medicare Other | Admitting: Physician Assistant

## 2021-03-03 ENCOUNTER — Ambulatory Visit (INDEPENDENT_AMBULATORY_CARE_PROVIDER_SITE_OTHER): Payer: Medicare Other | Admitting: Physician Assistant

## 2021-03-03 ENCOUNTER — Encounter: Payer: Self-pay | Admitting: Physician Assistant

## 2021-03-03 ENCOUNTER — Other Ambulatory Visit: Payer: Self-pay

## 2021-03-03 DIAGNOSIS — U099 Post covid-19 condition, unspecified: Secondary | ICD-10-CM

## 2021-03-03 DIAGNOSIS — F331 Major depressive disorder, recurrent, moderate: Secondary | ICD-10-CM | POA: Diagnosis not present

## 2021-03-03 DIAGNOSIS — G9332 Myalgic encephalomyelitis/chronic fatigue syndrome: Secondary | ICD-10-CM | POA: Diagnosis not present

## 2021-03-03 DIAGNOSIS — G4733 Obstructive sleep apnea (adult) (pediatric): Secondary | ICD-10-CM

## 2021-03-03 DIAGNOSIS — R2990 Unspecified symptoms and signs involving the nervous system: Secondary | ICD-10-CM | POA: Diagnosis not present

## 2021-03-03 MED ORDER — LAMOTRIGINE 25 MG PO TABS: 75.0000 mg | ORAL_TABLET | Freq: Every day | ORAL | 0 refills | Status: DC

## 2021-03-03 NOTE — Progress Notes (Signed)
[facility]  Patient ID: Antonio [name],  MRN: [id number]  PCP: Leeroy Cha, MD  Date of Evaluation: 03/03/2021 Time spent:20 minutes  Chief Complaint:  Chief Complaint   Depression; Follow-up     HISTORY/CURRENT STATUS: For routine med check.   At the last visit we started Lamictal.  He has been on 50 mg for about 1 month now.  States he is feeling maybe 60 to 70% better as far as his mood goes.  He is not having irritability like he did and he and his wife are not arguing as much.  He does not feel as "down in the dumps" as he did before restarting this.  Has a little bit more energy but not a lot.  His appetite is normal and weight is stable.  He is able to run errands, do all ADLs including taking care of their rescue dogs.  He is not isolating.  Not crying easily.  Not sleeping all the time.  No suicidal or homicidal thoughts.  We also restarted the low-dose naltrexone to help with brain fog.  He feels like he has fewer episodes of the extreme fogginess and it is not as severe when it does happen.  Of course it is hard to say if that is due to the naltrexone or just time passing.  Not as forgetful.  Patient denies increased energy with decreased need for sleep, no increased talkativeness, no racing thoughts, no impulsivity or risky behaviors, no increased spending, no increased libido, no grandiosity, no increased irritability or anger, and no hallucinations.  Denies dizziness, syncope, seizures, numbness, tingling, tremor, tics, unsteady gait, slurred speech, confusion. Denies muscle or joint pain, stiffness, or dystonia.  Individual Medical History/ Review of Systems: Changes? :No     Past medications for mental health diagnoses include: Wellbutrin SR Wellbutrin XL, Zoloft caused falls, Cymbalta  Allergies: Metformin and related, Wellbutrin [bupropion], and Zoloft [sertraline]  Current Medications:  Current Outpatient Medications:    acetaminophen  (TYLENOL) 500 MG tablet, , Disp: , Rfl:    amLODipine (NORVASC) 5 MG tablet, Take 1 tablet by mouth daily., Disp: , Rfl:    atorvastatin (LIPITOR) 10 MG tablet, , Disp: , Rfl:    cholecalciferol (VITAMIN D) 1000 units tablet, Take 1,000 Units by mouth in the morning and at bedtime. , Disp: , Rfl:    ELDERBERRY PO, Take 2 each by mouth daily. Gummies, Disp: , Rfl:    famotidine (PEPCID) 10 MG tablet, Take 10 mg by mouth daily. , Disp: , Rfl:    ferrous sulfate 325 (65 FE) MG tablet, Take 325 mg by mouth daily with breakfast., Disp: , Rfl:    Naltrexone POWD, Take 1.5 mg by mouth daily., Disp: 250 g, Rfl: 2   potassium chloride SA (K-DUR,KLOR-CON) 20 MEQ tablet, Take 20 mEq by mouth daily. , Disp: , Rfl: 3   sildenafil (REVATIO) 20 MG tablet, , Disp: , Rfl:    empagliflozin (JARDIANCE) 25 MG TABS tablet, Take 1 tablet (25 mg total) by mouth daily before breakfast. (Patient not taking: Reported on 06/30/2020), Disp: 30 tablet, Rfl: 11   lamoTRIgine (LAMICTAL) 25 MG tablet, Take 3 tablets (75 mg total) by mouth daily., Disp: 270 tablet, Rfl: 0   Tamsulosin HCl (FLOMAX) 0.4 MG CAPS, Take 1 capsule (0.4 mg total) by mouth daily after breakfast. (Patient not taking: Reported on 06/30/2020), Disp: 30 capsule, Rfl: 0   traMADol (ULTRAM) 50 MG tablet, Take 1-2 tablets (50-100 mg total) by mouth  every 6 (six) hours as needed for moderate pain or severe pain. (Patient not taking: Reported on 06/30/2020), Disp: 20 tablet, Rfl: 0   Turmeric Curcumin 500 MG CAPS, , Disp: , Rfl:  Medication Side Effects: none  Family Medical/ Social History: Changes?   MENTAL HEALTH EXAM:  There were no vitals taken for this visit.There is no height or weight on file to calculate BMI.  General Appearance: Casual and Well Groomed  Eye Contact:  Good  Speech:  Clear and Coherent and Normal Rate  Volume:  Normal  Mood:  Euthymic  Affect:  Congruent  Thought Process:  Goal Directed and Descriptions of Associations: Circumstantial   Orientation:  Full (Time, Place, and Person)  Thought Content: Logical   Suicidal Thoughts:  No  Homicidal Thoughts:  No  Memory:  Immediate;   Fair Remote;   Good  Judgement:  Good  Insight:  Good  Psychomotor Activity:  Normal  Concentration:  Concentration: Good  Recall:  Good  Fund of Knowledge: Good  Language: Good  Assets:  Desire for Improvement  ADL's:  Intact  Cognition: WNL  Prognosis:  Good     DIAGNOSES:    ICD-10-CM   1. Major depressive disorder, recurrent episode, moderate (HCC)  F33.1     2. Obstructive sleep apnea syndrome  G47.33     3. Post-COVID chronic neurologic symptoms  R29.90    U09.9     4. 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 Jonni Sanger Mitchum   RECOMMENDATIONS:  PDMP was reviewed.  No results available. I provided 20 minutes of face to face time during this encounter, including time spent before and after the visit in records review, medical decision making, counseling pertinent to today's visit, and charting.  I'm glad to see him doing better! We decided to increase the Lamictal a little more, we both believe he will have even more benefit. Increase Lamictal 25 mg to 3 p.o. nightly. Continue naltrexone 1.5 mg daily.  Because of the expense, if he would like to stop it and see how he does with the brain fog after his current supply is gone, he can discontinue it.  We will see how he does off of it.  If he wants to continue that is fine too.  It is difficult to know whether he is feeling better from this or if it is the tincture of time.  He understands. Continue CPAP use. Continue therapy with Dr. [name]. Return in 6 weeks.  [name], PA-C

## 2021-03-04 ENCOUNTER — Ambulatory Visit (INDEPENDENT_AMBULATORY_CARE_PROVIDER_SITE_OTHER): Payer: Medicare Other | Admitting: Psychiatry

## 2021-03-04 DIAGNOSIS — F331 Major depressive disorder, recurrent, moderate: Secondary | ICD-10-CM

## 2021-03-04 DIAGNOSIS — F651 Transvestic fetishism: Secondary | ICD-10-CM

## 2021-03-04 DIAGNOSIS — Z630 Problems in relationship with spouse or partner: Secondary | ICD-10-CM

## 2021-03-04 DIAGNOSIS — G9332 Myalgic encephalomyelitis/chronic fatigue syndrome: Secondary | ICD-10-CM | POA: Diagnosis not present

## 2021-03-04 NOTE — Progress Notes (Incomplete)
Psychotherapy Progress Note [facility] Antonio Moore, PhD LP  Patient ID: Antonio [name] [place] [name] [name])    MRN: [id number] Therapy format: Family therapy w/ patient -- accompanied by Antonio [name] Date: 03/04/2021      Start: 11:17a     Stop: ***:***     Time Spent: *** min Location: In-person   Session narrative (presenting needs, interim history, self-report of stressors and symptoms, applications of prior therapy, status changes, and interventions made in session) Enjoyed the Saturday TV date, wants to do it again.  Kicking himself  Says he is getting throug hdog care in an hour and a half, still not starting until mid evening.  Affirmed reducing distraction, challenged to start earlier.    Antonio [name] wants his temper to be the priority now.  He says Lamictal has helped, and maybe the low-dose naltrexone are helping with irritable feelings.  Wife c/o it still, like yesterday's incident with her   Therapeutic modalities: {AM:23362::"Cognitive Behavioral Therapy","Solution-Oriented/Positive Psychology"}  Mental Status/Observations:  Appearance:   {PSY:22683}     Behavior:  {PSY:21022743}  Motor:  {PSY:22302}  Speech/Language:   {PSY:22685}  Affect:  {PSY:22687}  Mood:  {PSY:31886}  Thought process:  {PSY:31888}  Thought content:    {PSY:980-134-4128}  Sensory/Perceptual disturbances:    {[phone]}  Orientation:  {Psych Orientation:23301::"Fully oriented"}  Attention:  {Good-Fair-Poor ratings:23770::"Good"}    Concentration:  {Good-Fair-Poor ratings:23770::"Good"}  Memory:  {PSY:209-660-4219}  Insight:    {Good-Fair-Poor ratings:23770::"Good"}  Judgment:   {Good-Fair-Poor ratings:23770::"Good"}  Impulse Control:  {Good-Fair-Poor ratings:23770::"Good"}   Risk Assessment: Danger to Self: {Risk:22599::"No"} Self-injurious Behavior: {Risk:22599::"No"} Danger to Others: {Risk:22599::"No"} Physical Aggression / Violence: {Risk:22599::"No"} Duty to Warn:  {AMYesNo:22526::"No"} Access to Firearms a concern: {AMYesNo:22526::"No"}  Assessment of progress:  {Progress:22147::"progressing"}  Diagnosis: No diagnosis found. Plan:  Anti-irritability tactics Meds as directed Do-overs for outbursts or bad moments Consider Antonio [name]'s good reasons for asking something Permission to make light fun of the catastrophizing Can ask "Well, what are you thinking?" Intimacy problem Reach for Antonio [name]'s hand going into restaurant tonight. Repeat TV time Return to what to understand about why it fell off on honeymoon and it's been 42 years hard to come by Other recommendations/advice as may be noted above Continue to utilize previously learned skills ad lib Maintain medication as prescribed and work faithfully with relevant prescriber(s) if any changes are desired or seem indicated Call the clinic on-call service, 988/hotline, 911, or present to Antonio [name] or ER if any life-threatening psychiatric crisis Return in about 2 weeks (around 03/18/2021) for session(s) already scheduled, recommend scheduling ahead. Already scheduled visit in this office 03/18/2021.  Antonio Serve, PhD Antonio Moore, PhD LP Clinical Psychologist, [name] [address] [place], [place] [address] [phone]

## 2021-03-11 ENCOUNTER — Ambulatory Visit (INDEPENDENT_AMBULATORY_CARE_PROVIDER_SITE_OTHER): Payer: Medicare Other

## 2021-03-11 ENCOUNTER — Other Ambulatory Visit: Payer: Self-pay

## 2021-03-11 ENCOUNTER — Ambulatory Visit: Payer: Medicare Other | Admitting: Podiatry

## 2021-03-11 DIAGNOSIS — M79671 Pain in right foot: Secondary | ICD-10-CM | POA: Diagnosis not present

## 2021-03-11 DIAGNOSIS — M7741 Metatarsalgia, right foot: Secondary | ICD-10-CM | POA: Diagnosis not present

## 2021-03-11 DIAGNOSIS — M79672 Pain in left foot: Secondary | ICD-10-CM

## 2021-03-11 DIAGNOSIS — L03115 Cellulitis of right lower limb: Secondary | ICD-10-CM | POA: Diagnosis not present

## 2021-03-11 DIAGNOSIS — B351 Tinea unguium: Secondary | ICD-10-CM

## 2021-03-11 DIAGNOSIS — M7742 Metatarsalgia, left foot: Secondary | ICD-10-CM

## 2021-03-11 MED ORDER — CEPHALEXIN 500 MG PO CAPS: 500.0000 mg | ORAL_CAPSULE | Freq: Three times a day (TID) | ORAL | 0 refills | Status: DC

## 2021-03-11 NOTE — Progress Notes (Signed)
Dg  

## 2021-03-14 NOTE — Progress Notes (Signed)
Subjective:   Patient ID: Antonio [name], male   DOB: 81 y.o.   MRN: [id number]   HPI 81 year old male presents the office today for concerns of discomfort in the balls of his feet.  He states this started about 2 weeks ago after he change socks.  He started wearing compression socks and states that now he is getting used to them he is not having pain with was.  Discussed that dull aching sensation to the right side worse than left.  He has noticed swelling to his legs and he is currently on cephalexin for his right leg as he had some increased redness.  The swelling is not new.  Denies any open sores or any drainage.  Denies any fevers or chills.  No nausea or vomiting.   Review of Systems  All other systems reviewed and are negative.  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   Chronic back pain    Depression    Diabetes mellitus without complication ([place])    not on meds    DNR (do not resuscitate) 03/30/2020   GERD (gastroesophageal reflux disease)    Heart murmur    History of carpal tunnel syndrome    Bilateral   Hyperlipidemia    "borderline"   Hypertension    Leg edema, right    OSA on CPAP    RBBB 01/11/2016   Noted on EKG    Past Surgical History:  Procedure Laterality Date   CARPAL TUNNEL RELEASE Bilateral    CHOLECYSTECTOMY N/A 03/31/2020   Procedure: LAPAROSCOPIC CHOLECYSTECTOMY WITH INTRAOPERATIVE CHOLANGIOGRAM, TAP BLOCK;  Surgeon: Johnathan Hausen, MD;  Location: [facility];  Service: General;  Laterality: N/A;   INGUINAL HERNIA REPAIR N/A 03/11/2020   Procedure: LAPAROSCOPIC BILATERAL INGUINAL HERNIA REPAIR WITH MESH, TAP BLOCK;  Surgeon: Michael Boston, MD;  Location: [facility];  Service: General;  Laterality: N/A;   LUMBAR LAMINECTOMY/DECOMPRESSION MICRODISCECTOMY  09/06/2011   Procedure: LUMBAR LAMINECTOMY/DECOMPRESSION MICRODISCECTOMY 1  LEVEL;  Surgeon: Hosie Spangle, MD;  Location: [facility];  Service: Neurosurgery;  Laterality: Left;  Left lumbar three-four Lumbar Laminotomy/microdiskectomy   LUMBAR LAMINECTOMY/DECOMPRESSION MICRODISCECTOMY Left 12/26/2012   Procedure: LUMBAR LAMINECTOMY/DECOMPRESSION MICRODISCECTOMY LEFT LUMBAR THREE-FOUR ;  Surgeon: Hosie Spangle, MD;  Location: [place] [facility];  Service: Neurosurgery;  Laterality: Left;   POLYPECTOMY     removed small intestine   REVERSE SHOULDER ARTHROPLASTY Left 07/11/2019   Procedure: REVERSE SHOULDER ARTHROPLASTY;  Surgeon: Netta Cedars, MD;  Location: [facility];  Service: Orthopedics;  Laterality: Left;  interscalene block   ROTATOR CUFF REPAIR Left    TONSILLECTOMY       Current Outpatient Medications:    cephALEXin (KEFLEX) 500 MG capsule, Take 1 capsule (500 mg total) by mouth 3 (three) times daily., Disp: 30 capsule, Rfl: 0   acetaminophen (TYLENOL) 500 MG tablet, , Disp: , Rfl:    amLODipine (NORVASC) 5 MG tablet, Take 1 tablet by mouth daily., Disp: , Rfl:    atorvastatin (LIPITOR) 10 MG tablet, , Disp: , Rfl:    cholecalciferol (VITAMIN D) 1000 units tablet, Take 1,000 Units by mouth in the morning and at bedtime. , Disp: , Rfl:    ELDERBERRY PO, Take 2 each by mouth daily. Gummies, Disp: , Rfl:    empagliflozin (JARDIANCE) 25 MG TABS tablet, Take 1  tablet (25 mg total) by mouth daily before breakfast. (Patient not taking: Reported on 06/30/2020), Disp: 30 tablet, Rfl: 11   famotidine (PEPCID) 10 MG tablet, Take 10 mg by mouth daily. , Disp: , Rfl:    ferrous sulfate 325 (65 FE) MG tablet, Take 325 mg by mouth daily with breakfast., Disp: , Rfl:    lamoTRIgine (LAMICTAL) 25 MG tablet, Take 3 tablets (75 mg total) by mouth daily., Disp: 270 tablet, Rfl: 0   Naltrexone POWD, Take 1.5 mg by mouth daily., Disp: 250 g, Rfl: 2   potassium chloride SA (K-DUR,KLOR-CON) 20 MEQ tablet, Take 20 mEq by mouth daily. , Disp: , Rfl: 3   sildenafil (REVATIO) 20 MG  tablet, , Disp: , Rfl:    Tamsulosin HCl (FLOMAX) 0.4 MG CAPS, Take 1 capsule (0.4 mg total) by mouth daily after breakfast. (Patient not taking: Reported on 06/30/2020), Disp: 30 capsule, Rfl: 0   traMADol (ULTRAM) 50 MG tablet, Take 1-2 tablets (50-100 mg total) by mouth every 6 (six) hours as needed for moderate pain or severe pain. (Patient not taking: Reported on 06/30/2020), Disp: 20 tablet, Rfl: 0   Turmeric Curcumin 500 MG CAPS, , Disp: , Rfl:   Allergies  Allergen Reactions   Metformin And Related Diarrhea   Wellbutrin [Bupropion] Other (See Comments)    Mental fogginess per patient   Zoloft [Sertraline] Other (See Comments)    Mental fogginess per patient          Objective:  Physical Exam  General: AAO x3, NAD  Dermatological: Nails are hypertrophic, dystrophic yellow-brown discoloration.  There is no edema, erythema to the toenail sites.  There is no open lesions bilaterally.  On the right leg there is some mild erythema, there is no significant increase in temperature gradient.  There is no fluctuation or crepitation.  Vascular: Dorsalis Pedis artery and Posterior Tibial artery pedal pulses are palpable bilateral with immedate capillary fill time. There is no pain with calf compression, swelling, warmth, erythema.   Neruologic: Sensation decreased with Semmes Weinstein monofilament.  Musculoskeletal: There is prominence submetatarsal 1 bilaterally, prominent sesamoids.  There is no area pinpoint tenderness and there is no edema to the foot. Muscular strength 5/5 in all groups tested bilateral.  Gait: Unassisted, Nonantalgic.       Assessment:   Metatarsalgia, chronic swelling right leg with concern for cellulitis     Plan:  -Treatment options discussed including all alternatives, risks, and complications -Etiology of symptoms were discussed -X-rays were obtained and reviewed with the patient.  There is no evidence of acute fracture or stress fracture noted  today. -Added metatarsal pads to his shoes for offloading -We will do another round of cephalexin given the swelling and redness of his right leg.  The calf is supple and there is no pain with calf compression.  Encouraged elevation. -As a courtesy debrided nails without any complications or bleeding. -Monitor for any clinical signs or symptoms of infection and directed to call the office immediately should any occur or go to the ER.  No follow-ups on file.  Trula Slade DPM

## 2021-03-18 ENCOUNTER — Ambulatory Visit: Payer: Medicare Other | Admitting: Psychiatry

## 2021-04-04 ENCOUNTER — Telehealth: Payer: Self-pay | Admitting: *Deleted

## 2021-04-04 ENCOUNTER — Encounter: Payer: Self-pay | Admitting: Podiatry

## 2021-04-04 ENCOUNTER — Other Ambulatory Visit: Payer: Self-pay | Admitting: Podiatry

## 2021-04-04 DIAGNOSIS — M7989 Other specified soft tissue disorders: Secondary | ICD-10-CM

## 2021-04-04 NOTE — Progress Notes (Signed)
Patient came in to the office today with his wife. He is still having leg swelling, which has been chronic and not changed. He has seen a vein specialist a long time ago but not recently. No worsening of symptoms since I last saw him. I am referring to [facility]. Referral placed.  ?

## 2021-04-04 NOTE — Telephone Encounter (Signed)
Faxed referral to [place] [facility] specialist in [place], received confirmation 04/04/21. ?

## 2021-04-14 ENCOUNTER — Ambulatory Visit: Payer: Medicare Other | Admitting: Physician Assistant

## 2021-04-15 ENCOUNTER — Other Ambulatory Visit: Payer: Self-pay

## 2021-04-15 ENCOUNTER — Ambulatory Visit (INDEPENDENT_AMBULATORY_CARE_PROVIDER_SITE_OTHER): Payer: Medicare Other | Admitting: Psychiatry

## 2021-04-15 DIAGNOSIS — Z630 Problems in relationship with spouse or partner: Secondary | ICD-10-CM

## 2021-04-15 DIAGNOSIS — F331 Major depressive disorder, recurrent, moderate: Secondary | ICD-10-CM | POA: Diagnosis not present

## 2021-04-15 DIAGNOSIS — U099 Post covid-19 condition, unspecified: Secondary | ICD-10-CM

## 2021-04-15 DIAGNOSIS — G9332 Myalgic encephalomyelitis/chronic fatigue syndrome: Secondary | ICD-10-CM | POA: Diagnosis not present

## 2021-04-15 DIAGNOSIS — G4733 Obstructive sleep apnea (adult) (pediatric): Secondary | ICD-10-CM
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
## 2021-04-18 ENCOUNTER — Ambulatory Visit: Payer: Medicare Other | Admitting: Physician Assistant

## 2021-04-18 ENCOUNTER — Encounter: Payer: Self-pay | Admitting: Physician Assistant

## 2021-04-18 ENCOUNTER — Other Ambulatory Visit: Payer: Self-pay

## 2021-04-18 DIAGNOSIS — F3341 Major depressive disorder, recurrent, in partial remission: Secondary | ICD-10-CM

## 2021-04-18 DIAGNOSIS — G4733 Obstructive sleep apnea (adult) (pediatric): Secondary | ICD-10-CM

## 2021-04-18 DIAGNOSIS — R5381 Other malaise: Secondary | ICD-10-CM | POA: Diagnosis not present

## 2021-04-18 DIAGNOSIS — R5383 Other fatigue: Secondary | ICD-10-CM | POA: Diagnosis not present

## 2021-04-18 NOTE — Progress Notes (Signed)
[facility] ? ?Patient ID: Antonio [name],  ?MRN: [id number] ? ?PCP: Leeroy Cha, MD ? ?Date of Evaluation: 04/18/2021 ?Time spent:20 minutes ? ?Chief Complaint:  ?Chief Complaint   ?Depression; Follow-up ?  ? ? ?HISTORY/CURRENT STATUS: ?For routine med check.  ? ?We increased Lamictal about 2 months ago. Mood is better.  More 'up days than downs.' Very tired all the time. Worse for a few months.  Not exactly sure when it started.  States he had labs with his primary provider at [facility] a few weeks ago and was told everything is fine.  He can fall asleep at the drop of a hat.  He is still able to take care of their foster dogs.  Does not cry easily.  Sleeps well at night.  Uses his CPAP every night.  Appetite is normal and weight is stable.  No complaints of severe anxiety.  No suicidal or homicidal thoughts. ? ?He is having a lot of problems with his legs, wearing support stockings now and has bilateral lower extremity edema that is obvious when he takes off the stockings.  States the bottom of his feet start hurting toward the end of the day.  He has an appointment to see vascular specialist tomorrow. ? ?Patient denies increased energy with decreased need for sleep, no increased talkativeness, no racing thoughts, no impulsivity or risky behaviors, no increased spending, no increased libido, no grandiosity, no increased irritability or anger, and no hallucinations. ? ?Denies dizziness, syncope, seizures, numbness, tingling, tremor, tics, unsteady gait, slurred speech, confusion. Denies muscle or joint pain, stiffness, or dystonia. ? ?Individual Medical History/ Review of Systems: Changes? :No    ? ?Past medications for mental health diagnoses include: ?Wellbutrin SR Wellbutrin XL, Zoloft caused falls, Cymbalta, naltrexone for post-COVID fog. ? ?Allergies: Metformin and related, Wellbutrin [bupropion], and Zoloft [sertraline] ? ?Current Medications:  ?Current Outpatient  Medications:  ?  acetaminophen (TYLENOL) 500 MG tablet, , Disp: , Rfl:  ?  amLODipine (NORVASC) 5 MG tablet, Take 1 tablet by mouth daily., Disp: , Rfl:  ?  cholecalciferol (VITAMIN D) 1000 units tablet, Take 1,000 Units by mouth in the morning and at bedtime. , Disp: , Rfl:  ?  ELDERBERRY PO, Take 2 each by mouth daily. Gummies, Disp: , Rfl:  ?  famotidine (PEPCID) 10 MG tablet, Take 10 mg by mouth daily. , Disp: , Rfl:  ?  ferrous sulfate 325 (65 FE) MG tablet, Take 325 mg by mouth daily with breakfast., Disp: , Rfl:  ?  lamoTRIgine (LAMICTAL) 25 MG tablet, Take 3 tablets (75 mg total) by mouth daily., Disp: 270 tablet, Rfl: 0 ?  potassium chloride SA (K-DUR,KLOR-CON) 20 MEQ tablet, Take 20 mEq by mouth daily. , Disp: , Rfl: 3 ?  sildenafil (REVATIO) 20 MG tablet, , Disp: , Rfl:  ?  atorvastatin (LIPITOR) 10 MG tablet, , Disp: , Rfl:  ?  empagliflozin (JARDIANCE) 25 MG TABS tablet, Take 1 tablet (25 mg total) by mouth daily before breakfast. (Patient not taking: Reported on 06/30/2020), Disp: 30 tablet, Rfl: 11 ?  Tamsulosin HCl (FLOMAX) 0.4 MG CAPS, Take 1 capsule (0.4 mg total) by mouth daily after breakfast. (Patient not taking: Reported on 06/30/2020), Disp: 30 capsule, Rfl: 0 ?  traMADol (ULTRAM) 50 MG tablet, Take 1-2 tablets (50-100 mg total) by mouth every 6 (six) hours as needed for moderate pain or severe pain. (Patient not taking: Reported on 06/30/2020), Disp: 20 tablet, Rfl: 0 ?  Turmeric Curcumin  500 MG CAPS, , Disp: , Rfl:  ?Medication Side Effects: none ? ?Family Medical/ Social History: Changes? no ? ?MENTAL HEALTH EXAM: ? ?There were no vitals taken for this visit.There is no height or weight on file to calculate BMI.  ?General Appearance: Casual and Well Groomed  ?Eye Contact:  Good  ?Speech:  Clear and Coherent and Normal Rate  ?Volume:  Normal  ?Mood:  Euthymic  ?Affect:  Congruent  ?Thought Process:  Goal Directed and Descriptions of Associations: Circumstantial  ?Orientation:  Full (Time, Place,  and Person)  ?Thought Content: Logical   ?Suicidal Thoughts:  No  ?Homicidal Thoughts:  No  ?Memory:  Immediate;   Fair ?Remote;   Good  ?Judgement:  Good  ?Insight:  Good  ?Psychomotor Activity:  Normal  ?Concentration:  Concentration: Good and Attention Span: Good  ?Recall:  Good  ?Fund of Knowledge: Good  ?Language: Good  ?Assets:  Desire for Improvement  ?ADL's:  Intact  ?Cognition: WNL  ?Prognosis:  Good  ? ?I am unable to see labs from [place]. ? ?DIAGNOSES:  ?  ICD-10-CM   ?1. Depression, major, recurrent, in partial remission ([place])  F33.41   ?  ?2. Malaise and fatigue  R53.81   ? R53.83   ?  ?3. Obstructive sleep apnea syndrome  G47.33   ?  ? ? ? ?Receiving Psychotherapy: Yes   Dr. Jonni Sanger Mitchum ? ? ?RECOMMENDATIONS:  ?PDMP was reviewed.  No results available. ?I provided 20 minutes of face to face time during this encounter, including time spent before and after the visit in records review, medical decision making, counseling pertinent to today's visit, and charting.  ?We discussed the fatigue.  He asked about the Lamictal, whether that could be causing it.  It is possible, but I think it unlikely due to the low dose and the fact that he does take it at night.  It should not last too long into the next day.  I recommend making no changes until he sees the vascular specialist tomorrow and go from there.  In fact we briefly discussed whether to increase the Lamictal or not which should help the depression even more.  Again no changes at this point. ? ?Continue Lamictal 25 mg, 3 p.o. nightly. ?Continue CPAP use. ?Continue therapy with Dr. [name]. ?Return in 3 months. ? ?[name], PA-C  ?

## 2021-05-06 ENCOUNTER — Ambulatory Visit (INDEPENDENT_AMBULATORY_CARE_PROVIDER_SITE_OTHER): Payer: Medicare Other | Admitting: Psychiatry

## 2021-05-06 DIAGNOSIS — Z630 Problems in relationship with spouse or partner: Secondary | ICD-10-CM

## 2021-05-06 DIAGNOSIS — F331 Major depressive disorder, recurrent, moderate: Secondary | ICD-10-CM | POA: Diagnosis not present

## 2021-05-06 DIAGNOSIS — U099 Post covid-19 condition, unspecified: Secondary | ICD-10-CM

## 2021-05-06 DIAGNOSIS — G9332 Myalgic encephalomyelitis/chronic fatigue syndrome: Secondary | ICD-10-CM

## 2021-05-06 NOTE — Progress Notes (Signed)
Psychotherapy Progress Note [facility] [name], PhD LP  Patient ID: Antonio [name] [facility] [name])    MRN: [id number] Therapy format: Family therapy w/ patient -- accompanied by Cecille Po Date: 05/06/2021      Start: 11:20a     Stop: 12:10p     Time Spent: 50 min Location: In-person   Session narrative (presenting needs, interim history, self-report of stressors and symptoms, applications of prior therapy, status changes, and interventions made in session) Antonio [name] says it's bad now.  She resigned her job with [employer], says she got disciplined over forwarding an email from the [employer].  Says the staff are in exodus mode now.  Support/empathy provided.   Says she discovered something at home that tells her Antonio [name] will never change.  Found a cash stash, not the first time.  Back in the beginning Antonio [name] lied about his income, was skimming cash off his paychecks, squirreled away $11K to buy knives, secret from her, and at a time when they were having to contend with ex wife and child support.  Another time got caught with a post office box (4x), he has hidden tax docs before, and the whole pattern smacks of financial infidelity and cutting her out of his life.  For his part, Antonio [name] does admit that he was tempted to buy more knives with the morning but realized on his own that it does not make sense.  Discussed at length, facilitated apology, expression of accurate empathy for Antonio [name]'s feelings, and obtained a promise to come clean about all monies he may have hidden.  Resolved to begin selling off his collection of knives, which he says he already intended to do.  Therapeutic modalities: Cognitive Behavioral Therapy and Solution-Oriented/Positive Psychology  Mental Status/Observations:  Appearance:   Casual     Behavior:  Appropriate  Motor:  Normal  Speech/Language:   Clear and Coherent  Affect:  Appropriate  Mood:  guilty  Thought process:  normal  Thought content:     WNL  Sensory/Perceptual disturbances:    WNL  Orientation:  Fully oriented  Attention:  Good    Concentration:  Good  Memory:  WNL  Insight:    Fair  Judgment:   Good  Impulse Control:  Fair   Risk Assessment: Danger to Self: No Self-injurious Behavior: No Danger to Others: No Physical Aggression / Violence: No Duty to Warn: No Access to Firearms a concern: No  Assessment of progress:  stabilized  Diagnosis:   ICD-10-CM   1. Major depressive disorder, recurrent episode, moderate (HCC)  F33.1     2. Relationship problem between partners  Z63.0     3. 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    U09.9      Plan:  At discretion, may search the house for any other stashes of cash or other secret keeping Maintain readiness to apologize again if Antonio [name]'s feelings warrant Begin designating knives for sale from his collection in order to show good faith about willingness to downsize and liquidate, and if any sales are made, be transparent about monies received As able, resume intentional time together in order to show good faith about spending quality time Other recommendations/advice as may be noted above Continue to utilize previously learned skills ad lib Maintain medication as prescribed and work faithfully with relevant prescriber(s) if any changes are desired or seem indicated Call the clinic on-call service, 988/hotline, 911, or present to [facility] or ER if any life-threatening psychiatric crisis Return for  session(s) already scheduled. Already scheduled visit in this office 05/27/2021.  Blanchie Serve, PhD [name], PhD LP Clinical Psychologist, [facility] [address], [place], [place] [address] [phone]

## 2021-05-27 ENCOUNTER — Ambulatory Visit (INDEPENDENT_AMBULATORY_CARE_PROVIDER_SITE_OTHER): Payer: Medicare Other | Admitting: Psychiatry

## 2021-05-27 DIAGNOSIS — U099 Post covid-19 condition, unspecified: Secondary | ICD-10-CM

## 2021-05-27 DIAGNOSIS — F331 Major depressive disorder, recurrent, moderate: Secondary | ICD-10-CM | POA: Diagnosis not present

## 2021-05-27 DIAGNOSIS — G4733 Obstructive sleep apnea (adult) (pediatric): Secondary | ICD-10-CM

## 2021-05-27 DIAGNOSIS — G9332 Myalgic encephalomyelitis/chronic fatigue syndrome: Secondary | ICD-10-CM | POA: Diagnosis not present

## 2021-05-27 DIAGNOSIS — F5104 Psychophysiologic insomnia: Secondary | ICD-10-CM | POA: Diagnosis not present

## 2021-05-27 DIAGNOSIS — Z630 Problems in relationship with spouse or partner: Secondary | ICD-10-CM

## 2021-05-27 DIAGNOSIS — Z8659 Personal history of other mental and behavioral disorders: Secondary | ICD-10-CM
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
## 2021-06-14 ENCOUNTER — Ambulatory Visit: Payer: Medicare Other

## 2021-06-14 ENCOUNTER — Ambulatory Visit: Payer: Medicare Other | Admitting: Podiatry

## 2021-06-14 DIAGNOSIS — E1149 Type 2 diabetes mellitus with other diabetic neurological complication: Secondary | ICD-10-CM | POA: Diagnosis not present

## 2021-06-14 DIAGNOSIS — M2041 Other hammer toe(s) (acquired), right foot: Secondary | ICD-10-CM

## 2021-06-14 DIAGNOSIS — M7742 Metatarsalgia, left foot: Secondary | ICD-10-CM

## 2021-06-14 DIAGNOSIS — M2022 Hallux rigidus, left foot: Secondary | ICD-10-CM

## 2021-06-14 DIAGNOSIS — E1122 Type 2 diabetes mellitus with diabetic chronic kidney disease: Secondary | ICD-10-CM

## 2021-06-14 DIAGNOSIS — M2042 Other hammer toe(s) (acquired), left foot: Secondary | ICD-10-CM

## 2021-06-14 DIAGNOSIS — L84 Corns and callosities: Secondary | ICD-10-CM

## 2021-06-14 DIAGNOSIS — M7741 Metatarsalgia, right foot: Secondary | ICD-10-CM

## 2021-06-14 DIAGNOSIS — M2021 Hallux rigidus, right foot: Secondary | ICD-10-CM

## 2021-06-14 DIAGNOSIS — L600 Ingrowing nail: Secondary | ICD-10-CM

## 2021-06-14 DIAGNOSIS — E1100 Type 2 diabetes mellitus with hyperosmolarity without nonketotic hyperglycemic-hyperosmolar coma (NKHHC): Secondary | ICD-10-CM

## 2021-06-14 NOTE — Progress Notes (Signed)
SITUATION Reason for Consult: Evaluation for Prefabricated Diabetic Shoes and Custom Diabetic Inserts. Patient / Caregiver Report: Patient would like well fitting shoes  OBJECTIVE DATA: Patient History / Diagnosis:    ICD-10-CM   1. Type 2 diabetes mellitus with stage 3a chronic kidney disease, without long-term current use of insulin (HCC)  E11.22    N18.31     2. Hammertoes of both feet  M20.41    M20.42       Physician Treating Diabetes:  Leeroy Cha, MD  Current or Previous Devices:   None and no history  In-Person Foot Examination: Ulcers & Callousing:   None Deformities:    Hammertoes Sensation:    Compromised  Shoe Size:     8W  ORTHOTIC RECOMMENDATION Recommended Devices: - 1x pair prefabricated PDAC approved diabetic shoes; Patient Selected Orthofeet [place] Black 521 Size 8W - 3x pair custom-to-patient PDAC approved vacuum formed diabetic insoles.  GOALS OF SHOES AND INSOLES - Reduce shear and pressure - Reduce / Prevent callus formation - Reduce / Prevent ulceration - Protect the fragile healing compromised diabetic foot.  Patient would benefit from diabetic shoes and inserts as patient has diabetes mellitus and the patient has one or more of the following conditions: - History of partial or complete amputation of the foot - History of previous foot ulceration. - History of pre-ulcerative callus - Peripheral neuropathy with evidence of callus formation - Foot deformity - Poor circulation  ACTIONS PERFORMED Potential out of pocket cost was communicated to patient. Patient understood and consented to measurement and casting. Patient was casted for insoles via crush box and measured for shoes via brannock device. Procedure was explained and patient tolerated procedure well. All questions were answered and concerns addressed. CMN submitted to treating physician. Casts were shipped to central fabrication for HOLD until Certificate of Medical Necessity or  otherwise necessary authorization from insurance is obtained.  PLAN Shoes are to be ordered and casts released from hold once all appropriate paperwork is complete. Patient is to be contacted and scheduled for fitting once shoes and insoles have been fabricated and received.

## 2021-06-14 NOTE — Patient Instructions (Signed)

## 2021-06-21 NOTE — Progress Notes (Signed)
Subjective: 81 year old male presents the office with his wife for concerns of pain to the bottom of his right foot he points on the area assessment rates painful callus at times.  The area is tender with pressure and with walking.  Tried changing shoes.  No recent injury.  Also had ingrown toenail was started about a month ago on the right big toe.  No drainage or pus.  He has had some redness and swelling.  He is diabetic and his last glucose he reports was 150 yesterday.  No recent injury or changes otherwise.   Objective: AAO x3, NAD DP/PT pulses palpable bilaterally, CRT less than 3 seconds Sensation decreased with Semmes Weinstein monofilament. There is prominence of the sesamoid plantarly on the right foot resulting in a hyperkeratotic lesion.  There is no underlying ulceration drainage or any signs of infection.  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  Incurvation present along the hallux toenail.  There is trace edema but there is no significant erythema there is no drainage or pus or signs of an abscess.  No fluctuance or crepitation.  No other areas of discomfort noted. Hammertoes present  No pain with calf compression, swelling, warmth, erythema  Assessment: Right prominent sesamoid, hallux rigidus resulted in hyperkeratotic lesion; ingrown toenail  Plan: -All treatment options discussed with the patient including all alternatives, risks, complications.  -I again reviewed the x-rays with him and his wife today. -Sharply debrided the hyperkeratotic lesion with any complications or bleeding.  Given the prominence the sesamoids we need to work on offloading this.  Dispensed offloading pads and also 1-follow-up with Aaron Edelman for possibly accommodative orthotics. -Regards to ingrown toenail discussed partial nail avulsion but ultimately he want to hold off on this.  I sharply debrided the corn without any complications or bleeding.  Should there be any reoccurrence or continuation  of symptoms will likely need to proceed with partial nail avulsion. -Patient encouraged to call the office with any questions, concerns, change in symptoms.   Trula Slade DPM

## 2021-06-23 ENCOUNTER — Other Ambulatory Visit: Payer: Self-pay

## 2021-06-23 ENCOUNTER — Telehealth: Payer: Self-pay | Admitting: Physician Assistant

## 2021-06-23 MED ORDER — LAMOTRIGINE 25 MG PO TABS: 75.0000 mg | ORAL_TABLET | Freq: Every day | ORAL | 0 refills | Status: DC

## 2021-06-23 NOTE — Telephone Encounter (Signed)
Rx sent 

## 2021-06-23 NOTE — Telephone Encounter (Signed)
Pt called at 9:41 am and said that he needs a refill on his lamictal 25 mg.Pharmacy is [facility] on [address]

## 2021-06-28 ENCOUNTER — Ambulatory Visit (INDEPENDENT_AMBULATORY_CARE_PROVIDER_SITE_OTHER): Payer: Medicare Other | Admitting: Psychiatry

## 2021-06-28 DIAGNOSIS — G9332 Myalgic encephalomyelitis/chronic fatigue syndrome: Secondary | ICD-10-CM

## 2021-06-28 DIAGNOSIS — Z630 Problems in relationship with spouse or partner: Secondary | ICD-10-CM

## 2021-06-28 DIAGNOSIS — G4733 Obstructive sleep apnea (adult) (pediatric): Secondary | ICD-10-CM

## 2021-06-28 DIAGNOSIS — U099 Post covid-19 condition, unspecified: Secondary | ICD-10-CM

## 2021-06-28 DIAGNOSIS — F5104 Psychophysiologic insomnia: Secondary | ICD-10-CM

## 2021-06-28 DIAGNOSIS — F331 Major depressive disorder, recurrent, moderate: Secondary | ICD-10-CM

## 2021-06-28 DIAGNOSIS — Z8659 Personal history of other mental and behavioral disorders: Secondary | ICD-10-CM

## 2021-06-28 NOTE — Progress Notes (Signed)
Psychotherapy Progress Note [name] Antonio Moore, PhD LP  Patient ID: Antonio [name] [name] "Antonio [name]")    MRN: [id number] Therapy format: Family therapy w/ patient -- accompanied by Antonio [name] Date: 06/28/2021      Start: 2:10p     Stop: 3:00p     Time Spent: 50 min Location: In-person   Session narrative (presenting needs, interim history, self-report of stressors and symptoms, applications of prior therapy, status changes, and interventions made in session) Antonio [name] says she's just about completely put out with Antonio [name] now, he's picking arguments, yelling at the dogs in earshot of neighbors, complaining about feeling sick after he gets angry (she believes raises his BP), and again staying up and procrastinating things like dog care.  Still no intimacy ... and it's all worse for her being out of work and home now.  Says he's habitually cross with her these days.  For himself, he says he's been feeling foggyheaded a good while, and they have 4 new puppies to foster who are just starting to calm down.  Relapsed in zoning out in front of the TV, resting long times and piling up tasks for late evening.  Recently started lamotrigine.  Support/empathy provided, validated that it has to be harder for her now, stuck at home much more of the time with things that disappoint her, as well as for him, with mental fog.  Sleep quality and timing really should be improved upon, including any voluntary staying up.  Did not check on caffeine recommendation from last time.  For rapport and support's sake, focused on communication habits with Antonio [name]'s complaint of saying one thing and doing another, and Antonio [name]'s acknowledgement of not being as clear as he portrays to her, and saying "yes" before comprehending.  Impressed on Antonio [name] the importance of both understanding a request and nailing down specifics, so they can both get free of the cycle of apparent agreement and letdown.  Advised on active listening,  especially Antonio [name], with freedom to pin down specifics when Antonio [name] says yes to something (what, when, how) and Antonio [name] to tolerate the queries.  Antonio [name] use his own feeling of irritation as a cue to ask Antonio [name] if she means the crabby or hostile thing he is tempted to believe.  Endorsed using a task list, too, to write down behavioral commitments so neither one has to deal with assumptions and convenient memory.  Discussed possibility of using electronic aid ([name] device), learned Antonio [name] has been setting reminders for Antonio [name], but she always frames them as "Antonio [name] says...", prompting resistance.  Agreed they should work together for Antonio [name] to speak his own reminders, so he makes the investment and the reminder comes from a "neutral party".    Therapeutic modalities: Cognitive Behavioral Therapy and Solution-Oriented/Positive Psychology  Mental Status/Observations:  Appearance:   Casual     Behavior:  Appropriate  Motor:  Normal  Speech/Language:   Clear and Coherent  Affect:  Appropriate  Mood:  Tense, compliant  Thought process:  normal  Thought content:    WNL  Sensory/Perceptual disturbances:    WNL  Orientation:  Fully oriented  Attention:  Good    Concentration:  Good  Memory:  grossly intact  Insight:    Fair  Judgment:   Good  Impulse Control:  Fair   Risk Assessment: Danger to Self: No Self-injurious Behavior: No Danger to Others: No Physical Aggression / Violence: No Duty to Warn: No Access to Firearms a concern: No  Assessment of  progress:  stabilized  Diagnosis:   ICD-10-CM   1. Major depressive disorder, recurrent episode, moderate (HCC)  F33.1     2. 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 Obstructive sleep apnea syndrome  G47.33     5. Relationship problem between partners  Z63.0     6. History of conduct disorder  Z86.59      Plan:  Add B complex for general neural health and to enable further recovery Earlier  caffeine curfew recommended Also try brighter lighting early in the day, more subdued later May need to reassess sleep apnea therapy Communication pointers -- active listening on both parts, agreement for Antonio [name] to pin down specifics, Antonio [name] ask "Do you mean ___?" questions when irritated Change [name] reminder strategy to come from Antonio [name] or Antonio [name]'s account -- he has to be invested in setting the reminder to be invested in following it Press on Landscape architect for sale, contacting a buyer/broker, and making sure the movement of money is transparent with Antonio [name]  Still try to establish some kind of quality time with Antonio [name], if/when she is interested [place] try to notice when he does something more faithful -- trust-building will be a piecemeal endeavor, and she will naturally see irritations and disappointments first Other recommendations/advice as may be noted above Continue to utilize previously learned skills ad lib Maintain medication as prescribed and work faithfully with relevant prescriber(s) if any changes are desired or seem indicated Call the clinic on-call service, 988/hotline, 911, or present to [name] or ER if any life-threatening psychiatric crisis Return for time as available. Already scheduled visit in this office 07/11/2021.  Antonio Serve, PhD Antonio Moore, PhD LP Clinical Psychologist, [name] [address], Antonio [name], [place] [address] [phone]

## 2021-06-29 ENCOUNTER — Telehealth: Payer: Self-pay

## 2021-06-29 NOTE — Telephone Encounter (Signed)
CMN Received - Shoes ordered and casts released from fabrication hold.  [place]

## 2021-07-11 ENCOUNTER — Ambulatory Visit (INDEPENDENT_AMBULATORY_CARE_PROVIDER_SITE_OTHER): Payer: Medicare Other | Admitting: Psychiatry

## 2021-07-11 DIAGNOSIS — F331 Major depressive disorder, recurrent, moderate: Secondary | ICD-10-CM | POA: Diagnosis not present

## 2021-07-11 DIAGNOSIS — G4733 Obstructive sleep apnea (adult) (pediatric): Secondary | ICD-10-CM | POA: Diagnosis not present

## 2021-07-11 DIAGNOSIS — G9332 Myalgic encephalomyelitis/chronic fatigue syndrome: Secondary | ICD-10-CM

## 2021-07-11 DIAGNOSIS — F651 Transvestic fetishism: Secondary | ICD-10-CM

## 2021-07-11 DIAGNOSIS — F5104 Psychophysiologic insomnia: Secondary | ICD-10-CM | POA: Diagnosis not present

## 2021-07-11 DIAGNOSIS — U099 Post covid-19 condition, unspecified: Secondary | ICD-10-CM

## 2021-07-11 DIAGNOSIS — Z630 Problems in relationship with spouse or partner: Secondary | ICD-10-CM

## 2021-07-11 DIAGNOSIS — Z8659 Personal history of other mental and behavioral disorders: Secondary | ICD-10-CM

## 2021-07-11 NOTE — Progress Notes (Addendum)
Psychotherapy Progress Note [facility] Antonio Moore, PhD LP  Patient ID: Antonio [name] [facility] "Antonio [name]")    MRN: [id number] Therapy format: Individual psychotherapy Date: 07/11/2021      Start: 10:06a     Stop: 11:06a     Time Spent: 60 min (45 min, remainder donated) Location: In-person   Session narrative (presenting needs, interim history, self-report of stressors and symptoms, applications of prior therapy, status changes, and interventions made in session) Brings a page-long note from Antonio [name], stating she'll be sitting out today, in hopes Antonio [name] will be more "honest", then reciting her complaints about him being irritable, lack of affection, keeping secrets, not knowing how to be married, and using COVID as an excuse for mental fog and things he's been doing their whole marriage.  Obviously wrought herself by the combination of downturns lately, between his behavior and age, her own injury, and certainly her own losses of job and church, which apparently sustained her in a dissatisfying marriage for many years.  Obviously too much also to try to incorporate the whole, but a valid ongoing need to cry out, so taken in that spirit, and worked to get positive behavioral movement with Antonio [name] on issues in daily life and relating to her, things that hopefully stand a chance of helping, rather than committing more time to analyzing "why".  For his part, Antonio [name] says he wants to know why he's been irritable most of his life, then tells an irrelevant story of a cross country trip in his 10s, while single, that basically illustrates the relief he felt getting divorced from his first wife and and making a couple friends in midlife.  Another irrelevant story of being slow to warm for an older men's gathering that turned out to be a good constant for him in older adulthood until people started dying off.  Acknowledged that these are tales of things he misses and times when he felt better, then  confronted the allegation of growing irritability, which he acknowledges, and plausibly attributes this week to feeling punk from a new diabetes med (Antonio [name]).  Having blown off the medicine today, he is feeling substantially better.  Interpreted his irritability as a combination of physiological issues (avg 6 hrs sleep a night, a legacy of working 2nd shift), fatigue and brain fog still attributable to Antonio [name], blood sugar fluctuations that must have been out of good control for him to register a 7+ A1C, plus ongoing responsibility stresses (19 dogs), plus escalating complaints from Antonio [name], history of shaming for secret behaviors, maybe feeling out of his depth in marriage, and maybe a longstanding resistant relationship with responsibility anyway.  Dog situation at 68 (4 rescue puppies, 15 permanent residents), and he admits he routinely gets himself parked in front of the TV and procrastinates to 9pm to start feeding them.    Knows he has been inadequate as a husband, tends to point to lots of things as obstacles, like once going to a hotel for a romantic attempt but having to go back and care for the dogs (?), trying once a while back to be intimate but experiencing ED, etc.  About Antonio [name]'s assertion he changed when he retired in 2007, he says he thought they would be less in the business of fostering dogs than they have been, but also admits he has been an automatic yes to taking them in and probably never voiced objections if he felt them.  Also admits he typically spends time on the couch  with 3 or 4 of them curled up on him and it feels too good to break up.  Encouraged him to find an occasion to tell Antonio [name] she's right to feel disappointed and distanced, and physically move the dogs aside and invite Antonio [name] to the couch, then offer a back rub or something she would appreciate.  Also well worth getting up and feeding the dogs earlier, and making opportunities to go out, as able an affordable.  Says he is  not hiding anything (women's clothing, cash, new knives) at this stage.  Therapeutic modalities: Cognitive Behavioral Therapy and Solution-Oriented/Positive Psychology  Mental Status/Observations:  Appearance:   Casual     Behavior:  Appropriate  Motor:  Normal  Speech/Language:   Clear and Coherent  Affect:  Appropriate  Mood:  dysthymic  Thought process:  normal  Thought content:    WNL and with excuse-making  Sensory/Perceptual disturbances:    WNL  Orientation:  Fully oriented  Attention:  Good    Concentration:  Fair  Memory:  grossly intact  Insight:    Fair  Judgment:   Good  Impulse Control:  Fair   Risk Assessment: Danger to Self: No Self-injurious Behavior: No Danger to Others: No Physical Aggression / Violence: No Duty to Warn: No Access to Firearms a concern: No  Assessment of progress:  stable  Diagnosis:   ICD-10-CM   1. Major depressive disorder, recurrent episode, moderate (HCC)  F33.1     2. 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 Obstructive sleep apnea syndrome  G47.33     5. Relationship problem between partners  Z63.0     6. History of conduct disorder  Z86.59     7. 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  May need to be more active and restrain carb intake to reduce b.s. variations and what irritability can come from them.   Maintain abstinence from secret spending and cross dressing Make a decisive outreach to Antonio [name] to validate she's right to be disappointed, invite to the couch in place of the snuggling dogs, and offer some form of desired touch, like a back rub. Decisive effort to feed dogs earlier, don't lounge so long Recommend earlier to bed and good light on waking Maintain more adequate self-care of diabetes and any other conditions that require regular attention, like OSA Other  recommendations/advice as may be noted above Continue to utilize previously learned skills ad lib Maintain medication as prescribed and work faithfully with relevant prescriber(s) if any changes are desired or seem indicated Call the clinic on-call service, 988/hotline, 911, or present to [facility] or ER if any life-threatening psychiatric crisis Return for session(s) already scheduled. Already scheduled visit in this office 07/22/2021.  Blanchie Serve, PhD Antonio Moore, PhD LP Clinical Psychologist, [facility] [address] [place], [place] [address] [phone]

## 2021-07-18 ENCOUNTER — Ambulatory Visit: Payer: Medicare Other | Admitting: Physician Assistant

## 2021-07-22 ENCOUNTER — Encounter: Payer: Self-pay | Admitting: Physician Assistant

## 2021-07-22 ENCOUNTER — Ambulatory Visit (INDEPENDENT_AMBULATORY_CARE_PROVIDER_SITE_OTHER): Payer: Medicare Other | Admitting: Physician Assistant

## 2021-07-22 DIAGNOSIS — R5381 Other malaise: Secondary | ICD-10-CM

## 2021-07-22 DIAGNOSIS — R454 Irritability and anger: Secondary | ICD-10-CM

## 2021-07-22 DIAGNOSIS — F3162 Bipolar disorder, current episode mixed, moderate: Secondary | ICD-10-CM

## 2021-07-22 DIAGNOSIS — R5383 Other fatigue: Secondary | ICD-10-CM

## 2021-07-22 MED ORDER — OXCARBAZEPINE 300 MG PO TABS: ORAL_TABLET | ORAL | 1 refills | Status: DC

## 2021-07-22 NOTE — Progress Notes (Signed)
[facility]  Patient ID: Antonio [name],  MRN: [id number]  PCP: Leeroy Cha, MD  Date of Evaluation: 07/22/2021 Time spent:30 minutes  Chief Complaint:  Chief Complaint   Depression; Follow-up    HISTORY/CURRENT STATUS: For routine med check.   He and [name] have still been having problems, maybe even more so.  He's irritable some days, states he wakes up that way, no reason, then he and wife argue, sometimes for a day, sometimes 3 days.  States he has been irritable all his life.  When he was younger he did have times where he had increased energy and did not miss sleep, could be impulsive at times, did not matter what people thought of him, no paranoia or hallucinations.  Feels like he has had depression all his life as well.  Has been tired forever, he even remembers telling his mother he was tired but she did not do anything about it.  Now he is more forgetful and has brain fog, he knows some of it is age but that has worsened after he had COVID over a year ago.  If he could he would like to stay in bed all the time to escape.  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  That has been extremely tiring.  He does not want to do much of anything.  He does not cry easily but sometimes will get more emotional if there is something sad on TV for example.  Appetite is normal and weight is stable.  Personal hygiene is normal.  No suicidal or homicidal thoughts.  Review of Systems  Constitutional:  Positive for malaise/fatigue.  HENT: Negative.    Eyes: Negative.   Respiratory: Negative.    Cardiovascular: Negative.   Gastrointestinal: Negative.   Genitourinary: Negative.   Musculoskeletal: Negative.   Skin: Negative.   Neurological: Negative.   Endo/Heme/Allergies: Negative.   Psychiatric/Behavioral:         See HPI     Individual Medical History/ Review of Systems: Changes? :No     Past  medications for mental health diagnoses include: Wellbutrin SR Wellbutrin XL, Zoloft caused falls, Cymbalta, naltrexone for post-COVID fog.  Allergies: Metformin and related, Wellbutrin [bupropion], and Zoloft [sertraline]  Current Medications:  Current Outpatient Medications:    acetaminophen (TYLENOL) 500 MG tablet, , Disp: , Rfl:    amLODipine (NORVASC) 5 MG tablet, Take 1 tablet by mouth daily., Disp: , Rfl:    cholecalciferol (VITAMIN D) 1000 units tablet, Take 1,000 Units by mouth in the morning and at bedtime. , Disp: , Rfl:    ELDERBERRY PO, Take 2 each by mouth daily. Gummies, Disp: , Rfl:    famotidine (PEPCID) 10 MG tablet, Take 10 mg by mouth daily. , Disp: , Rfl:    ferrous sulfate 325 (65 FE) MG tablet, Take 325 mg by mouth daily with breakfast., Disp: , Rfl:    lamoTRIgine (LAMICTAL) 25 MG tablet, Take 3 tablets (75 mg total) by mouth daily., Disp: 270 tablet, Rfl: 0   Oxcarbazepine (TRILEPTAL) 300 MG tablet, 1 po qhs for 3 nights, then 1 po bid., Disp: 60 tablet, Rfl: 1   potassium chloride SA (K-DUR,KLOR-CON) 20 MEQ tablet, Take 20 mEq by mouth daily. , Disp: , Rfl: 3   sildenafil (REVATIO) 20 MG tablet, , Disp: , Rfl:    atorvastatin (LIPITOR) 10 MG tablet, , Disp: , Rfl:  empagliflozin (JARDIANCE) 25 MG TABS tablet, Take 1 tablet (25 mg total) by mouth daily before breakfast. (Patient not taking: Reported on 06/30/2020), Disp: 30 tablet, Rfl: 11   Tamsulosin HCl (FLOMAX) 0.4 MG CAPS, Take 1 capsule (0.4 mg total) by mouth daily after breakfast. (Patient not taking: Reported on 06/30/2020), Disp: 30 capsule, Rfl: 0   traMADol (ULTRAM) 50 MG tablet, Take 1-2 tablets (50-100 mg total) by mouth every 6 (six) hours as needed for moderate pain or severe pain. (Patient not taking: Reported on 06/30/2020), Disp: 20 tablet, Rfl: 0   Turmeric Curcumin 500 MG CAPS, , Disp: , Rfl:  Medication Side Effects: none  Family Medical/ Social History: Changes? no  MENTAL HEALTH EXAM:  There  were no vitals taken for this visit.There is no height or weight on file to calculate BMI.  General Appearance: Casual and Well Groomed  Eye Contact:  Good  Speech:  Clear and Coherent and Normal Rate  Volume:  Normal  Mood:  Irritable and sad  Affect:  Congruent  Thought Process:  Goal Directed and Descriptions of Associations: Circumstantial  Orientation:  Full (Time, Place, and Person)  Thought Content: Logical   Suicidal Thoughts:  No  Homicidal Thoughts:  No  Memory:  Immediate;   Fair Remote;   Good  Judgement:  Good  Insight:  Good  Psychomotor Activity:  Normal  Concentration:  Concentration: Good and Attention Span: Good  Recall:  Good  Fund of Knowledge: Good  Language: Good  Assets:  Desire for Improvement Financial Resources/Insurance Housing Transportation Vocational/Educational  ADL's:  Intact  Cognition: WNL  Prognosis:  Good   Labs 06/28/2021 CMP glucose 131 A1c 7.3 See on chart through [facility] under [place]:    ICD-10-CM   1. Bipolar 1 disorder, mixed, moderate (HCC)  F31.62     2. Irritability and anger  R45.4     3. Malaise and fatigue  R53.81    R53.83       Receiving Psychotherapy: Yes   Dr. Jonni Sanger [place]  RECOMMENDATIONS:  PDMP was reviewed.  No results available. I provided 30 minutes of face to face time during this encounter, including time spent before and after the visit in records review, medical decision making, counseling pertinent to today's visit, and charting.  We discussed the anger and irritability and past history of manic behaviors.  He reports having been told he could have bipolar disorder at some point years ago but does not remember anything ever coming of it.  We discussed the diagnosis, symptoms, and treatment options.  I recommend adding Trileptal to help with the anger and irritability.  Benefits, risks and side effects were discussed and he would like to try it.  For now will continue the same  dose of Lamictal, if he is still having symptoms of depression at the next visit I will increase the dose of that.  Continue Lamictal 25 mg, 3 p.o. nightly. Start Trileptal 300 mg, 1 p.o. nightly for 3 nights then 1 p.o. twice daily. Continue CPAP use. Continue therapy with Dr. [name]. Return in 4 weeks.  [name], PA-C

## 2021-07-25 ENCOUNTER — Ambulatory Visit: Payer: Medicare Other | Admitting: Psychiatry

## 2021-07-25 DIAGNOSIS — U099 Post covid-19 condition, unspecified: Secondary | ICD-10-CM | POA: Diagnosis not present

## 2021-07-25 DIAGNOSIS — F331 Major depressive disorder, recurrent, moderate: Secondary | ICD-10-CM | POA: Diagnosis not present

## 2021-07-25 DIAGNOSIS — G9332 Myalgic encephalomyelitis/chronic fatigue syndrome: Secondary | ICD-10-CM | POA: Diagnosis not present

## 2021-07-25 DIAGNOSIS — Z630 Problems in relationship with spouse or partner: Secondary | ICD-10-CM | POA: Diagnosis not present

## 2021-07-25 NOTE — Progress Notes (Signed)
Psychotherapy Progress Note [name] Antonio [name], Antonio [name]  Patient ID: Antonio [name] [facility] [name])    MRN: [id number] Therapy format: Individual psychotherapy Date: 07/25/2021      Start: 11:18a     Stop: 12:06p     Time Spent: 48 min Location: In-person   Session narrative (presenting needs, interim history, self-report of stressors and symptoms, applications of prior therapy, status changes, and interventions made in session) Brings another note from wife, which he did not read, largely stating she's had it with him and despairs of the marriage and his irritability and lack of affection.  Admits he has episodes from 1-3 days at a time where he's sarcastic, caustic with her, for at least 6 months or so now, maybe a year.  She has sent him an article on childhood emotional neglect that makes some sense to him, aligned with the story of his mother's favoritism to brother and father's to himself.  Reiterates theme of loss re [name] he has been part of, affected by deaths, in particular friend Antonio [name], who d. of COVID early in the pandemic, and Antonio [name], who succumbed to Parkinson's Dz and suicided about a year ago.  Admits long habit of not being very attached with brother Antonio [name].  Never really did much together anyway.  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  Recalls with honest regret now how willingly he left [name] for a long weekend a couple times early in the relationship to go to knife shows.  Interpreted some fear of intimacy and relationships (loss? judgment?) deficit in driving each of the complained-of habits (knife collecting, cross-dressing, isolation, irritability), and his experience with Antonio [name] as setting up further fear of being connected and hurting in loss, and intensified death anxiety, seeing himself age, of course.  A week ago found  himself first time reaching out in empathy to one of the guys in the Antonio [name].  Currently get together about once a month.  Certainly encourage continue meeting, and look for opportunities to empathize.  At home, no, did not try inviting Antonio [name] to the couch.  Says she is having nausea and hot/cold flashes attributed to thyroid medication.  Acutely regrets having made her feel so alone, and when prompted to read her note himself (which he had not) wept a bit.  Resolved to reach out to Antonio [name] and sincerely, directly apologize for his anger last night and in these episodes, let her know she's on the right track about childhood emotional neglect and how the way it went with his brother (alienated) and mother (irritated).  Also how his father's example was rather cold to mother, and how mother was closed, indifferent.  Also how she kept the news from him that his father was in [name] with pneumonias, something which would have come across very cold, before whatever went wrong with first marriage.  Worth letting her know also how Antonio [name] suicide hit him harder than he's let on.  If she is willing, brainstorm concrete steps for quality time with each other and follow through.  Advocated how important it will be to persist as he offers Antonio [name] time and touch, don't let temporary feelings sidetrack it.  Therapeutic modalities: Cognitive Behavioral Therapy, Solution-Oriented/Positive Psychology, Insight-Oriented, and Interpersonal  Mental Status/Observations:  Appearance:   Casual     Behavior:  Appropriate  Motor:  Normal  Speech/Language:   Clear and Coherent  Affect:  Appropriate  Mood:  dysthymic and fatigued, tearful in places  Thought process:  normal  Thought content:    WNL  Sensory/Perceptual disturbances:    WNL  Orientation:  Fully oriented  Attention:  Good    Concentration:  Fair  Memory:  WNL  Insight:    Fair  Judgment:   Good  Impulse Control:  Good   Risk Assessment: Danger  to Self: No Self-injurious Behavior: No Danger to Others: No Physical Aggression / Violence: No Duty to Warn: No Access to Firearms a concern: No  Assessment of progress:  progressing  Diagnosis:   ICD-10-CM   1. Major depressive disorder, recurrent episode, moderate (HCC)  F33.1     2. 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 Relationship problem between partners  Z63.0      Plan:  Make apology to Antonio [name] for last night's outburst, validate her idea of childhood emotional neglect, then share as desired how affected by friends dying, brother Antonio [name], etc.  Mainly, agree and empathize wherever possible, since that's what she feels is so missing. Renew the effort to set aside quality time together, e.g., favorite show.  Let Antonio [name] accept or decline as she feels, no strings attached. Other recommendations/advice as may be noted above Continue to utilize previously learned skills ad lib Maintain medication as prescribed and work faithfully with relevant prescriber(s) if any changes are desired or seem indicated Call the clinic on-call service, 988/hotline, 911, or present to [facility] or ER if any life-threatening psychiatric crisis Return in about 2 weeks (around 08/08/2021) for session(s) already scheduled. Already scheduled visit in this office 08/12/2021.  Blanchie Serve, Antonio Antonio [name], Antonio [name] Clinical Psychologist, [name] [address], [place], [place] [address] [phone]

## 2021-08-02 ENCOUNTER — Telehealth: Payer: Self-pay | Admitting: Physician Assistant

## 2021-08-02 NOTE — Telephone Encounter (Signed)
Antonio [name] lvm stating he discontinued his Oxcarbazepine. He stated he experienced too many of the side effects.  Next appointment: 7/28  Contact info : # [phone]

## 2021-08-02 NOTE — Telephone Encounter (Signed)
Pt was taking 1 tab hs for 3 days and had no side effects,when he increased to 2 he noticed he was very irritable,tired,and weak.He said it made him very tired during the day.He does not want to continue

## 2021-08-03 NOTE — Telephone Encounter (Signed)
See if he'll stay on 1 pill, since it did not cause side effects.  It may take a month or so but hopefully will see a decrease in anger and irritability.  If he is adamant about stopping it though, that is okay.

## 2021-08-04 NOTE — Telephone Encounter (Signed)
Side affects have resolved with stopping med,he prefers not to take it anymore

## 2021-08-04 NOTE — Telephone Encounter (Signed)
noted 

## 2021-08-12 ENCOUNTER — Ambulatory Visit (INDEPENDENT_AMBULATORY_CARE_PROVIDER_SITE_OTHER): Payer: Medicare Other | Admitting: Psychiatry

## 2021-08-12 DIAGNOSIS — U099 Post covid-19 condition, unspecified: Secondary | ICD-10-CM

## 2021-08-12 DIAGNOSIS — Z630 Problems in relationship with spouse or partner: Secondary | ICD-10-CM | POA: Diagnosis not present

## 2021-08-12 DIAGNOSIS — G9332 Myalgic encephalomyelitis/chronic fatigue syndrome: Secondary | ICD-10-CM | POA: Diagnosis not present

## 2021-08-12 DIAGNOSIS — F331 Major depressive disorder, recurrent, moderate: Secondary | ICD-10-CM

## 2021-08-12 DIAGNOSIS — F5104 Psychophysiologic insomnia: Secondary | ICD-10-CM

## 2021-08-12 DIAGNOSIS — G4733 Obstructive sleep apnea (adult) (pediatric): Secondary | ICD-10-CM

## 2021-08-12 NOTE — Progress Notes (Signed)
Psychotherapy Progress Note [facility] [name], PhD LP  Patient ID: DETRIC SCALISI [facility] [name])    MRN: [id number] Therapy format: Individual psychotherapy Date: 08/12/2021      Start: 11:15a     Stop: 12:03p     Time Spent: 48 min Location: In-person   Session narrative (presenting needs, interim history, self-report of stressors and symptoms, applications of prior therapy, status changes, and interventions made in session) Preoccupied again with feeling tired all the time and foggyheaded.  Did not particularly remember therapy homework/recommendations from last time, but able to state only 2 yelling incidents since at met, says shorter and quicker to recover from them.  One had to do with a dispute whether to move mattress out of the living space, and [place] not wanting him to move it off a stair well that is legitimately inconvenient for both of them.  Can't recall the other one.  Says [name] has been stomach sick with something unknown, trying to manage thyroid medication lately and recently had colonoscopy, can't find anything yet.  (Reason to believe it is somaticized grief and distress related to feeling she has lost husband, church, career, and peaceful home all in one, but her own puzzle to solve with her own health care.)  Inexplicably lost his wedding ring, not sure when.  Noticed 3 nights ago.  No memories of when would have taken it off, vague idea maybe it got hooked on something, though that is probably implausible.  Figure most likely took it off to wash/clean, and it was recently, since it does feel odd not to have it on after 43 years, and that would've been noticed early on.  Leery of telling [name], because she'll go into high gear looking for it, or maybe take it as an unconscious  sign of not wanting to be married, but encouraged be willing to mention it, not sit on it like a secret.  Returned to efforts to heal marital distress and give  back better to [place].  Agreed getting sufficient time with friends will be helpful, and he was growing in ability to relate to others before the group was interrupted.  Reviewed messages and insights to share with [facility] and drafted recommendations (below, and in AVS).  Therapeutic modalities: Cognitive Behavioral Therapy, Solution-Oriented/Positive Psychology, and Assertiveness/Communication  Mental Status/Observations:  Appearance:   Casual     Behavior:  Appropriate  Motor:  Normal  Speech/Language:   Clear and Coherent  Affect:  Appropriate  Mood:  dysthymic  Thought process:  normal  Thought content:    WNL  Sensory/Perceptual disturbances:    WNL  Orientation:  Fully oriented  Attention:  Good    Concentration:  Fair  Memory:  grossly intact, some limitations  Insight:    Fair  Judgment:   Good  Impulse Control:  Good   Risk Assessment: Danger to Self: No Self-injurious Behavior: No Danger to Others: No Physical Aggression / Violence: No Duty to Warn: No Access to Firearms a concern: No  Assessment of progress:  stabilized  Diagnosis:   ICD-10-CM   1. Major depressive disorder, recurrent episode, moderate (HCC)  F33.1     2. Relationship problem between partners  Z63.0     3. 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 Obstructive sleep apnea syndrome  G47.33     5. 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 marriage: Tell [name] she's on the right track about childhood emotional neglect, and thank you for trying to help figure it out. Worth saying that Mom modeled being indifferent, and it's been an uphill climb learning to do better than that with friends and family. Let her know that the [employer] group was helping you do better until you lost Gene and Simona Huh, and that that has been harder than you've talked about.  You still miss them, and you were getting better at making friends, and it felt like a  big let down for it to stop the way it did, and maybe that's part of why you get irritated.   Before she makes a suggestion how to get that back, you already have the idea of getting the others together at [facility]. Worth asking [name] again to help get the mattress out of the way before Tuesday.  It's inconvenient where it is, and you'll both feel better with it out of your way. Really worth asking [name] -- can you tell when or where I've been trying to hold my temper? Still try to make some kind of quality time where you hang out together, if she's up to it, time you don't have to solve problems or get work done.    Prior/ongoing Manage sleep duration and quality to be better rested -- understood waking to urinate b/w 4 and 7am, but inadequate CPAP, late light or caffeine, generalized nervous system arousal, pain, or other factors could be stealing quality sleep, leading to persistent complaints of fogginess and irritability Get adequate activity/movement.  May resume walking or stationary bike if it suits. Try to work ahead in the day timing dog care Keep trying to get ahead of temper and say you feel frustrated rather than just play it out Continue abstinence from knife-buying and cross-dressing Other recommendations/advice as may be noted above Continue to utilize previously learned skills ad lib Maintain medication as prescribed and work faithfully with relevant prescriber(s) if any changes are desired or seem indicated Call the clinic on-call service, 988/hotline, 911, or present to [facility] or ER if any life-threatening psychiatric crisis Return for recommend scheduling ahead. Already scheduled visit in this office 08/19/2021.  Blanchie Serve, PhD [name], PhD LP Clinical Psychologist, [facility] [address] [place], [place] [address] [phone]

## 2021-08-12 NOTE — Patient Instructions (Addendum)
Recommendations today:  Tell [name] she's on the right track about childhood emotional neglect, and thank you for trying to help figure it out. Worth saying that Mom modelled being indifferent, and it's been an uphill climb learning to do better than that with friends and family. Let her know that the [employer] group was helping you do better until you lost Gene and Simona Huh, and that that has been harder than you've talked about.  You still miss them, and you were getting better at making friends, and it felt like a big let down for it to stop the way it did, and maybe that's part of why you get irritated.   Before she makes a suggestion how to get that back, you already have the idea of getting the others together at [facility]. Worth asking [name] again to help get the mattress out of the way before Tuesday.  It's inconvenient where it is, and you'll both feel better with it out of your way. Really worth asking [name] -- can you tell when or where I've been trying to hold my temper? Still try to make some kind of quality time where you hang out together, if she's up to it, time you don't have to solve problems or get work done.

## 2021-08-19 ENCOUNTER — Ambulatory Visit (INDEPENDENT_AMBULATORY_CARE_PROVIDER_SITE_OTHER): Payer: Medicare Other | Admitting: Physician Assistant

## 2021-08-19 ENCOUNTER — Encounter: Payer: Self-pay | Admitting: Physician Assistant

## 2021-08-19 DIAGNOSIS — G4733 Obstructive sleep apnea (adult) (pediatric): Secondary | ICD-10-CM | POA: Diagnosis not present

## 2021-08-19 DIAGNOSIS — F3341 Major depressive disorder, recurrent, in partial remission: Secondary | ICD-10-CM | POA: Diagnosis not present

## 2021-08-19 DIAGNOSIS — R5381 Other malaise: Secondary | ICD-10-CM

## 2021-08-19 DIAGNOSIS — R5383 Other fatigue: Secondary | ICD-10-CM

## 2021-08-19 NOTE — Progress Notes (Signed)
[facility] Med [facility]  Patient ID: Antonio [name],  MRN: [id number]  PCP: Leeroy Cha, MD  Date of Evaluation: 08/19/2021 Time spent:20 minutes  Chief Complaint:  Chief Complaint   Depression; Follow-up     HISTORY/CURRENT STATUS: For routine med check.   Trileptal was stopped because he experienced dizziness, fatigue and weakness.  He had it even with taking one of the 300 mg but it got worse when he increased it to 1 p.o. twice daily.  States he is feeling pretty good right now.  The irritability is better.  We again discussed the fact that he may have been told long ago that he had bipolar disorder.  States he does not remember that now.  But it might have been brought up.  Denies increased energy with decreased need for sleep.  No impulsivity or risky behavior.  No increased spending.  No grandiosity.  No paranoia, and no hallucinations.  Does not feel depressed now.  He feels that the Lamictal is working well.  He likes to read and watch TV for enjoyment.  Energy and motivation are fair to good, depending on the day and how he feels physically.  He is trying to do more things around the house, helping out his wife.  And also doing the usual care of their rescue dogs.  Personal hygiene is normal.  Appetite is normal.  He is sleeping pretty well most nights.  Not complaining of anxiety.  No suicidal or homicidal thoughts.  Denies dizziness, syncope, seizures, numbness, tingling, tremor, tics, unsteady gait, slurred speech, confusion. Denies muscle or joint pain, stiffness, or dystonia.  Individual Medical History/ Review of Systems: Changes? :No     Past medications for mental health diagnoses include: Wellbutrin SR Wellbutrin XL, Zoloft caused falls, Cymbalta, naltrexone for post-COVID fog.  Allergies: Metformin and related, Wellbutrin [bupropion], and Zoloft [sertraline]  Current Medications:  Current Outpatient Medications:    acetaminophen (TYLENOL) 500 MG  tablet, , Disp: , Rfl:    amLODipine (NORVASC) 5 MG tablet, Take 1 tablet by mouth daily., Disp: , Rfl:    cholecalciferol (VITAMIN D) 1000 units tablet, Take 1,000 Units by mouth in the morning and at bedtime. , Disp: , Rfl:    ELDERBERRY PO, Take 2 each by mouth daily. Gummies, Disp: , Rfl:    famotidine (PEPCID) 10 MG tablet, Take 10 mg by mouth daily. , Disp: , Rfl:    ferrous sulfate 325 (65 FE) MG tablet, Take 325 mg by mouth daily with breakfast., Disp: , Rfl:    lamoTRIgine (LAMICTAL) 25 MG tablet, Take 3 tablets (75 mg total) by mouth daily., Disp: 270 tablet, Rfl: 0   potassium chloride SA (K-DUR,KLOR-CON) 20 MEQ tablet, Take 20 mEq by mouth daily. , Disp: , Rfl: 3   sildenafil (REVATIO) 20 MG tablet, , Disp: , Rfl:    atorvastatin (LIPITOR) 10 MG tablet, , Disp: , Rfl:    empagliflozin (JARDIANCE) 25 MG TABS tablet, Take 1 tablet (25 mg total) by mouth daily before breakfast. (Patient not taking: Reported on 06/30/2020), Disp: 30 tablet, Rfl: 11   Tamsulosin HCl (FLOMAX) 0.4 MG CAPS, Take 1 capsule (0.4 mg total) by mouth daily after breakfast. (Patient not taking: Reported on 06/30/2020), Disp: 30 capsule, Rfl: 0   traMADol (ULTRAM) 50 MG tablet, Take 1-2 tablets (50-100 mg total) by mouth every 6 (six) hours as needed for moderate pain or severe pain. (Patient not taking: Reported on 06/30/2020), Disp: 20 tablet, Rfl: 0  Turmeric Curcumin 500 MG CAPS, , Disp: , Rfl:  Medication Side Effects: none  Family Medical/ Social History: Changes? no  MENTAL HEALTH EXAM:  There were no vitals taken for this visit.There is no height or weight on file to calculate BMI.  General Appearance: Casual and Well Groomed  Eye Contact:  Good  Speech:  Clear and Coherent and Normal Rate  Volume:  Normal  Mood:  Euthymic  Affect:  Congruent  Thought Process:  Goal Directed and Descriptions of Associations: Circumstantial  Orientation:  Full (Time, Place, and Person)  Thought Content: Logical    Suicidal Thoughts:  No  Homicidal Thoughts:  No  Memory:  Immediate;   Fair Remote;   Good  Judgement:  Good  Insight:  Good  Psychomotor Activity:  Normal  Concentration:  Concentration: Good and Attention Span: Good  Recall:  Good  Fund of Knowledge: Good  Language: Good  Assets:  Desire for Improvement Financial Resources/Insurance Housing Transportation Vocational/Educational  ADL's:  Intact  Cognition: WNL  Prognosis:  Good    DIAGNOSES:    ICD-10-CM   1. 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 Obstructive sleep apnea syndrome  G47.33     3. Malaise and fatigue  R53.81    R53.83       Receiving Psychotherapy: Yes   Dr. Jonni Sanger Mitchum  RECOMMENDATIONS:  PDMP was reviewed.  No results available. I provided 20 minutes of face to face time during this encounter, including time spent before and after the visit in records review, medical decision making, counseling pertinent to today's visit, and charting.   He seems to be doing better with irritability so I will not try to add any other meds in.  After talking in more detail about the possible history of bipolar disorder, the symptoms do not fit so I am changing his diagnosis back to major depressive disorder. Depression is well controlled so no change in treatment.  Discontinue Trileptal already. Continue Lamictal 25 mg, 3 p.o. nightly. Continue CPAP use. Continue therapy with Dr. [name]. Return in 3 months.  [name], PA-C

## 2021-08-22 ENCOUNTER — Other Ambulatory Visit: Payer: Medicare Other

## 2021-08-24 ENCOUNTER — Ambulatory Visit (INDEPENDENT_AMBULATORY_CARE_PROVIDER_SITE_OTHER): Payer: Medicare Other

## 2021-08-24 DIAGNOSIS — M2042 Other hammer toe(s) (acquired), left foot: Secondary | ICD-10-CM

## 2021-08-24 DIAGNOSIS — E1149 Type 2 diabetes mellitus with other diabetic neurological complication: Secondary | ICD-10-CM

## 2021-08-24 DIAGNOSIS — M2041 Other hammer toe(s) (acquired), right foot: Secondary | ICD-10-CM

## 2021-08-24 DIAGNOSIS — L84 Corns and callosities: Secondary | ICD-10-CM

## 2021-08-24 NOTE — Progress Notes (Addendum)
Patient presents to the office today with issues concerning the diabetic shoes picked up on 08/24/21.   The shoes feel good but is not the shoe patient wanted, he was told it was the only shoe he could get at the time.  We will send the orthofeet brand, style tacoma, size 8 wide men's back.   Reorder: Apex X801 size 8 wide men's  Patient kept insoles for other shoes. Advised to bring back a pair to check fit of reorder.  Patient will be notified for a fitting appointment once the shoes arrive in office.

## 2021-09-02 ENCOUNTER — Ambulatory Visit (INDEPENDENT_AMBULATORY_CARE_PROVIDER_SITE_OTHER): Payer: Medicare Other | Admitting: Psychiatry

## 2021-09-02 DIAGNOSIS — G9332 Myalgic encephalomyelitis/chronic fatigue syndrome: Secondary | ICD-10-CM | POA: Diagnosis not present

## 2021-09-02 DIAGNOSIS — G4733 Obstructive sleep apnea (adult) (pediatric): Secondary | ICD-10-CM | POA: Diagnosis not present

## 2021-09-02 DIAGNOSIS — F3341 Major depressive disorder, recurrent, in partial remission: Secondary | ICD-10-CM | POA: Diagnosis not present

## 2021-09-02 DIAGNOSIS — F5104 Psychophysiologic insomnia: Secondary | ICD-10-CM

## 2021-09-02 DIAGNOSIS — Z630 Problems in relationship with spouse or partner: Secondary | ICD-10-CM

## 2021-09-02 DIAGNOSIS — U099 Post covid-19 condition, unspecified: Secondary | ICD-10-CM

## 2021-09-02 DIAGNOSIS — Z72821 Inadequate sleep hygiene: Secondary | ICD-10-CM

## 2021-09-02 NOTE — Patient Instructions (Signed)
Priority today: I think the thing we can most do for brain fog is to help your brain be most ready for sleep.  That means getting stimulants out of the way well before bedtime, and those stimulants are caffeine and light.   For deeper, more quality sleep, turn down the stimulating stuff a couple hours before sleep: Stop coffee by 3pm if at all possible Turn the lights down about 9pm Go on to bed by midnight, no matter what For better energy in the daytime, turn up the light and the air you get: Have good light, as early as possible Take 5-7 big, deep breaths to freshen up your brain (extra oxygen) any time you want There may be other things we can do, but let's start with these

## 2021-09-02 NOTE — Progress Notes (Signed)
Psychotherapy Progress Note [facility] [name], PhD LP  Patient ID: Antonio [name] [facility] "Antonio [name]")    MRN: [id number] Therapy format: Individual psychotherapy Date: 09/02/2021      Start: 11:19a     Stop: 12:05p     Time Spent: 46 min Location: In-person   Session narrative (presenting needs, interim history, self-report of stressors and symptoms, applications of prior therapy, status changes, and interventions made in session) "Not too bad" at home.  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  16 of them currently in their care.  1 passed of cancer a couple days ago, which he says affects Antonio [name] more.  Frustrated again with taking care of the dogs and managing behavior, but 15 of them are dogs they decided to own, not foster, and committed.  Clarified how much he and Antonio [name] have been on the same page -- past impression that he has been equally interested.  On relationship homework, says he did tell Antonio [name] he appreciated her looking into the idea that he was affected by maternal emotional neglect, and how mother's modelling affected his way of treating friends/family.  Also acknowledged feeling set back personally by losses in the "[employer]  Antonio [name] took it all in the spirit intended.  Also worked through the mattress issue that was so irritating to him at last session.  Got it out of the house OK, eventually, resolved to just be OK with it being in the way until trash day.  Overall, feels his temper is durably under control.  Challenge remains to establish quality time with Antonio [name], understanding some of it will be overshadowed with illness as she works through thyroid issue and medication changes.  For social exposure and warming up attachments, discussed initiating another get-together with the guys, encouraged to be the one who asks, starts the plan rolling.     Still more concerned with mental clarity and energy.  Says he is regular with CPAP, nose pillow type, no issues, just occasional adjustment.  Main culprit, he says, is catching a second wind late evening and staying up.  "Bad habit" of getting on Facebook and winding up an hour watching videos.  Checked phone display settings in session and scheduled night mode to come on 8pm.  Advised on bringing down his room lighting around 9pm.  Demonstrated with office lighting, which made him yawn several times, suggesting he is experiencing significant quality sleep deprivation.    Therapeutic modalities: Cognitive Behavioral Therapy, Solution-Oriented/Positive Psychology, and Assertiveness/Communication  Mental Status/Observations:  Appearance:   Casual     Behavior:  Appropriate  Motor:  Normal  Speech/Language:   Clear and Coherent  Affect:  Appropriate  Mood:  dysthymic  Thought process:  normal  Thought content:    WNL  Sensory/Perceptual disturbances:    WNL  Orientation:  Fully oriented  Attention:  Good    Concentration:  Fair  Memory:  WNL  Insight:    Fair  Judgment:   Good  Impulse Control:  Fair   Risk Assessment: Danger to Self: No Self-injurious Behavior: No Danger to Others: No Physical Aggression / Violence: No Duty to Warn: No Access to Firearms a concern: No  Assessment of progress:  progressing  Diagnosis:   ICD-10-CM   1. 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 Obstructive sleep apnea syndrome  G47.33     3. Relationship problem between partners  Z63.0     4. 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 Inadequate sleep hygiene  [phone]      Plan:  Priority today: I think the thing we can most do for brain fog is to help your brain be most ready for sleep.  That means getting stimulants out of the way well before bedtime, and those stimulants are caffeine and  light.   For deeper, more quality sleep, turn down the stimulating stuff a couple hours before sleep: Stop coffee by 3pm if at all possible Turn the lights down about 9pm Go on to bed by midnight, no matter what For better energy in the daytime, turn up the light and the air you get: Have good light, as early as possible Take 5-7 big, deep breaths to freshen up your brain (extra oxygen) any time you want There may be other things we can do, but let's start with these Prior/ongoing: Manage sleep duration and quality to be better rested -- understood waking to urinate b/w 4 and 7am, but inadequate CPAP, late light or caffeine, generalized nervous system arousal, pain, or other factors could be stealing quality sleep, leading to persistent complaints of fogginess and irritability Get adequate activity/movement.  May resume walking or stationary bike if it suits. Try to work ahead in the day timing dog care, don't procrastinate, do a little something when thinking of it Keep trying to get ahead of temper and say you feel frustrated rather than just play it out.  If desired, ask Antonio [name] if she can tell when he is trying to keep from popping off, out of respect for her, or otherwise trying to respect her better Look for opportunities to establish a little quality time with Antonio [name] Generally work at giving grace first with Antonio [name] Continue abstinence from Antonio [name] and cross-dressing Other recommendations/advice as may be noted above Continue to utilize previously learned skills ad lib Maintain medication as prescribed and work faithfully with relevant prescriber(s) if any changes are desired or seem indicated Call the clinic on-call service, 988/hotline, 911, or present to [facility] or ER if any life-threatening psychiatric crisis Return for session(s) already scheduled, available earlier @ PT's need. Already scheduled visit in this office 10/14/2021.  Antonio Serve, PhD [name], PhD LP Clinical  Psychologist, [facility] [address] [place], [place] [address] [phone]

## 2021-10-05 ENCOUNTER — Other Ambulatory Visit: Payer: Self-pay | Admitting: Physician Assistant

## 2021-10-05 ENCOUNTER — Ambulatory Visit: Payer: Medicare Other | Admitting: Psychiatry

## 2021-10-11 ENCOUNTER — Other Ambulatory Visit: Payer: Self-pay | Admitting: Nephrology

## 2021-10-11 DIAGNOSIS — N1832 Chronic kidney disease, stage 3b: Secondary | ICD-10-CM

## 2021-10-14 ENCOUNTER — Ambulatory Visit (INDEPENDENT_AMBULATORY_CARE_PROVIDER_SITE_OTHER): Payer: Medicare Other | Admitting: Psychiatry

## 2021-10-14 DIAGNOSIS — F331 Major depressive disorder, recurrent, moderate: Secondary | ICD-10-CM | POA: Diagnosis not present

## 2021-10-14 DIAGNOSIS — Z630 Problems in relationship with spouse or partner: Secondary | ICD-10-CM | POA: Diagnosis not present

## 2021-10-14 DIAGNOSIS — G9332 Myalgic encephalomyelitis/chronic fatigue syndrome: Secondary | ICD-10-CM | POA: Diagnosis not present

## 2021-10-14 DIAGNOSIS — U099 Post covid-19 condition, unspecified: Secondary | ICD-10-CM

## 2021-10-14 DIAGNOSIS — Z72821 Inadequate sleep hygiene: Secondary | ICD-10-CM

## 2021-10-14 DIAGNOSIS — F5104 Psychophysiologic insomnia: Secondary | ICD-10-CM

## 2021-10-14 DIAGNOSIS — G4733 Obstructive sleep apnea (adult) (pediatric): Secondary | ICD-10-CM

## 2021-10-14 NOTE — Progress Notes (Signed)
Psychotherapy Progress Note [facility] 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  Hoping to land another one.  Says she's here b/c she's come to the conclusion that Antonio [name] is incorrigible after 52 yrs -- no sex, no explanation, temper tantrums, yelling at the dogs.  Says she's been sleeping on a mat on the floor for 14 yrs now.    Checked whether the claim of sleeping on the floor is more about martyrdom, as it sounds, or self-punishment, perhaps (as another current patient does), but more that she started not wanting to share the bed some years ago now and got used to it, feels good snuggled up with the dogs by now.  Encouraged her to consider if she'd like that to change any.  Meanwhile, focal problem of Antonio [name] taking out aggravations on the dogs, verbally (no abuse, just had for her to hear), and how he will raise his voice to her sometimes.  Antonio [name] notes he will mistake what she says (partly hearing?) and jump to conclusions and react.  Challenged whether he believes she means what he interprets and coached to ask or check perceptions.  Also coached in idea of getting beter at speaking up for feeling frustrated rather than going off on issues as perceived.  Encouraged find 5x a day to say, "I'm frustrated", for practice.  Therapeutic modalities: Cognitive Behavioral Therapy, Solution-Oriented/Positive Psychology, Ego-Supportive, and Assertiveness/Communication  Mental Status/Observations:  Appearance:   Casual      Behavior:  Appropriate  Motor:  Normal  Speech/Language:   Clear and Coherent  Affect:  Appropriate  Mood:  subdued  Thought process:  concrete  Thought content:    worry  Sensory/Perceptual disturbances:    WNL  Orientation:  Fully oriented  Attention:  Good    Concentration:  Fair  Memory:  grossly intact  Insight:    Fair  Judgment:   Good  Impulse Control:  Good   Risk Assessment: Danger to Self: No Self-injurious Behavior: No Danger to Others: No Physical Aggression / Violence: No Duty to Warn: No Access to Firearms a concern: No  Assessment of progress:  stabilized  Diagnosis:   ICD-10-CM   1. Major depressive disorder, recurrent episode, moderate (HCC)  F33.1     2. Relationship problem between partners  Z63.0     3. 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 Obstructive sleep apnea syndrome  G47.33     6. Inadequate sleep hygiene  [phone]      Plan:  Communication work Find 5/day to say "I'm frustrated" and check perceptions before reacting to what he believes he hears from [place] trying to get ahead of temper and say you feel frustrated rather than just play it out.  If desired, ask Antonio [name] if she can tell when he is trying to keep from popping off, out of respect for her, or otherwise trying to  respect her better Look for opportunities to establish a little quality time with Antonio [name] Generally work at giving grace first with [place] When able, resume some dedicated time to share TV, hold hands Brain fog  Work on sleep readiness by making sure stimulants out of the way well before bedtime.   Stop coffee by 3pm if at all possible Turn the lights down about 9pm Go on to bed by midnight, no matter what For better energy in the daytime Bring up light as early as possible Practice5-7 big, deep breaths morning and anytime need a refresher Other sleep issues hold down evening fluids Address  pain as needed Make sure CPAP is adjusted correctly Get adequate activity/movement -- resume walking or stationary bike if it suits Dog care -- Try to work ahead in the day, don't procrastinate, do a little something when thinking of it Addictive habits -- continue abstinence knife-buying and cross-dressing Other recommendations/advice as may be noted above Continue to utilize previously learned skills ad lib Maintain medication as prescribed and work faithfully with relevant prescriber(s) if any changes are desired or seem indicated Call the clinic on-call service, 988/hotline, 911, or present to [facility] or ER if any life-threatening psychiatric crisis Return for as already scheduled. Already scheduled visit in this office 11/08/2021.  Blanchie Serve, PhD [name], PhD LP Clinical Psychologist, [facility] [address] [place], [place] [address] [phone]

## 2021-10-14 NOTE — Patient Instructions (Addendum)
3 assignments:  Every day, find a time to say "I'm frustrated" or "I'm tired right now".  Saying it will make it easier not to take it out on Antonio [name]. Every day, try to find a moment to check what you hear from Antonio [name] -- "Let me check, do you mean ___?"  Checking will prevent fights. At least 3 times a week, invent a reason to be in physical touch with Antonio [name] -- hold a hand, sit down and watch TV together, put an arm around her, experiment with lying down in the same bed, offer her a hug, etc.  Never mind the dogs, just snuggle up like the dogs do.  Never mind feeling awkward --

## 2021-10-17 ENCOUNTER — Ambulatory Visit
Admission: RE | Admit: 2021-10-17 | Discharge: 2021-10-17 | Disposition: A | Discharge status: Home / Self Care | Payer: Medicare Other | Source: Ambulatory Visit | Attending: Nephrology | Admitting: Nephrology

## 2021-10-17 DIAGNOSIS — N1832 Chronic kidney disease, stage 3b: Secondary | ICD-10-CM

## 2021-11-01 ENCOUNTER — Other Ambulatory Visit: Payer: Self-pay | Admitting: Nephrology

## 2021-11-01 DIAGNOSIS — N2889 Other specified disorders of kidney and ureter: Secondary | ICD-10-CM

## 2021-11-08 ENCOUNTER — Ambulatory Visit (INDEPENDENT_AMBULATORY_CARE_PROVIDER_SITE_OTHER): Payer: Medicare Other | Admitting: Psychiatry

## 2021-11-08 DIAGNOSIS — F331 Major depressive disorder, recurrent, moderate: Secondary | ICD-10-CM

## 2021-11-08 DIAGNOSIS — G9332 Myalgic encephalomyelitis/chronic fatigue syndrome: Secondary | ICD-10-CM

## 2021-11-08 DIAGNOSIS — G4733 Obstructive sleep apnea (adult) (pediatric): Secondary | ICD-10-CM

## 2021-11-08 DIAGNOSIS — Z630 Problems in relationship with spouse or partner: Secondary | ICD-10-CM

## 2021-11-08 DIAGNOSIS — U099 Post covid-19 condition, unspecified: Secondary | ICD-10-CM

## 2021-11-08 DIAGNOSIS — F5104 Psychophysiologic insomnia: Secondary | ICD-10-CM

## 2021-11-08 NOTE — Progress Notes (Signed)
Psychotherapy Progress Note [facility] [name], PhD LP  Patient ID: WATSON ROBARGE [facility] [name])    MRN: [id number] Therapy format: Family therapy w/ patient -- accompanied by Cecille Po Date: 11/08/2021      Start: 10:10a     Stop: 11:00a     Time Spent: 50 min Location: In-person   Session narrative (presenting needs, interim history, self-report of stressors and symptoms, applications of prior therapy, status changes, and interventions made in session) Still c/o [name] yelling at the dogs.  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  Resolved to change methods of getting their attention, [name]'s idea to set up a can with nuts and bolts in it.    Actually, the household is benefiting right now from a very well-mannered foster dog, will miss him when he adopts out.  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  Medically, [name] saw nephrologist, found a mass on right kidney.  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  Has mostly eliminated Pepsi now, which helps with caffeine, sugar, and kidney load.  Are enjoying watching "[place]" together.  [name] finds sometimes it touches him in ways he's not used to, evokes things he wishes he'd done.  Affirmed and encouraged.  [name] c/o [name] c/o a range of things.  [name] still c/o him interrupting, sometimes yelling, sometimes smart mouthing, sometimes disclaiming things as impossible before he tries them.  Encouraged in suspending judgment to find out first, and refreshed on perception-checking when if tempted to react to [place].  Recommended also he take his own gruff moments, for which he frequently apologizes, and don't just apologize, try do-overs.  Meanwhile, resolved with [name] to make her own priority of asking his attention first and his willingness to hear her out, rather than expect him to just turn over a new leaf and be automatically good-willed, as long as this  habit is.     New hx given of infertility together and a fallen-through adoption attempt.    Therapeutic modalities: Cognitive Behavioral Therapy, Solution-Oriented/Positive Psychology, Ego-Supportive, and Assertiveness/Communication  Mental Status/Observations:  Appearance:   Casual     Behavior:  Appropriate  Motor:  Normal  Speech/Language:   Clear and Coherent  Affect:  Appropriate  Mood:  dysthymic  Thought process:  normal  Thought content:    WNL  Sensory/Perceptual disturbances:    WNL  Orientation:  Fully oriented  Attention:  Good    Concentration:  Fair  Memory:  grossly intact  Insight:    Fair  Judgment:   Good  Impulse Control:  Fair   Risk Assessment: Danger to Self: No Self-injurious Behavior: No Danger to Others: No Physical Aggression / Violence: No Duty to Warn: No Access to Firearms a concern: No  Assessment of progress:  stabilized  Diagnosis:   ICD-10-CM   1. Major depressive disorder, recurrent episode, moderate (HCC)  F33.1     2. Relationship problem between partners  Z63.0     3. 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 Obstructive sleep apnea syndrome  G47.33      Plan:  Communication/conflict Find 5/day to say "I'm frustrated" and check perceptions before reacting to what he believes he hears from [place] trying to get ahead of temper and say you feel frustrated rather than just  play it out.  If desired, ask [name] if she can tell when he is trying to keep from popping off, out of respect for her, or otherwise trying to respect her better Look for opportunities to establish a little quality time with [name], e.g., Heart[place] show Generally work at giving grace first with [place] When able, resume some dedicated time to share TV, hold hands [place] option to stop sleeping on the floor, return to the bed [place] make priority of asking/getting [name]'s attention before expecting  it Brain fog  Work on sleep readiness by making sure stimulants out of the way well before bedtime.   Stop coffee by 3pm if at all possible Turn the lights down about 9pm Go on to bed by midnight, no matter what Kidney care, including reducing sodas For better energy in the daytime Bring up light as early as possible Practice 5-7 big, deep breaths morning and anytime need a refresher Get adequate activity/movement -- resume walking or stationary bike if it suits Other sleep issues Hold down evening fluids Address pain as needed Make sure CPAP is adjusted correctly Dog care Try to work ahead in the day, don't procrastinate, do a little something when thinking of it Switch from yelling to noisemaker Addictive habits -- continue abstinence knife-buying and cross-dressing Other recommendations/advice as may be noted above Continue to utilize previously learned skills ad lib Maintain medication as prescribed and work faithfully with relevant prescriber(s) if any changes are desired or seem indicated Call the clinic on-call service, 988/hotline, 911, or present to [facility] or ER if any life-threatening psychiatric crisis Return for time as available. Already scheduled visit in this office 11/21/2021.  Blanchie Serve, PhD [name], PhD LP Clinical Psychologist, [facility] [address] [place], [place] [address] [phone]

## 2021-11-12 ENCOUNTER — Ambulatory Visit
Admission: RE | Admit: 2021-11-12 | Discharge: 2021-11-12 | Disposition: A | Discharge status: Home / Self Care | Payer: Medicare Other | Source: Ambulatory Visit | Attending: Nephrology | Admitting: Nephrology

## 2021-11-12 DIAGNOSIS — N2889 Other specified disorders of kidney and ureter: Secondary | ICD-10-CM

## 2021-11-12 MED ORDER — GADOPICLENOL 0.5 MMOL/ML IV SOLN
7.5000 mL | Freq: Once | INTRAVENOUS | Status: AC | PRN
  Administered 2021-11-12: 7.5 mL via INTRAVENOUS

## 2021-11-18 ENCOUNTER — Other Ambulatory Visit: Payer: Medicare Other

## 2021-11-21 ENCOUNTER — Encounter: Payer: Self-pay | Admitting: Physician Assistant

## 2021-11-21 ENCOUNTER — Ambulatory Visit (INDEPENDENT_AMBULATORY_CARE_PROVIDER_SITE_OTHER): Payer: Medicare Other | Admitting: Physician Assistant

## 2021-11-21 DIAGNOSIS — F3341 Major depressive disorder, recurrent, in partial remission: Secondary | ICD-10-CM | POA: Diagnosis not present

## 2021-11-21 DIAGNOSIS — R5381 Other malaise: Secondary | ICD-10-CM

## 2021-11-21 DIAGNOSIS — G4733 Obstructive sleep apnea (adult) (pediatric): Secondary | ICD-10-CM | POA: Diagnosis not present

## 2021-11-21 DIAGNOSIS — R5383 Other fatigue: Secondary | ICD-10-CM | POA: Diagnosis not present

## 2021-11-21 NOTE — Progress Notes (Signed)
[facility]  Patient ID: Antonio [name],  MRN: [id number]  PCP: Leeroy Cha, MD  Date of Evaluation: 11/21/2021 Time spent:20 minutes  Chief Complaint:  Chief Complaint   Depression; Follow-up    HISTORY/CURRENT STATUS: For routine med check.   Is tired a lot, and feels "foggy headed."  States it all started when he had COVID.  His PCP put him on iron which he has been taking.  He also takes all meds routinely, including multivitamin, B complex, and vitamin D.  He has an appointment with his PCP next month.  He is able to enjoy things sometimes.  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  Energy and motivation are fair to good depending on the day.  Appetite is normal and weight is stable.  ADLs and personal hygiene are normal.  He is not isolating.  He likes to read.  Not isolating. Not anxious.  States he sleeps well, uses CPAP, feels rested when he gets up, does not take naps.  No suicidal or homicidal thoughts.  Patient denies increased energy with decreased need for sleep, increased talkativeness, racing thoughts, impulsivity or risky behaviors, increased spending, increased libido, grandiosity, increased irritability or anger, paranoia, or hallucinations.  Denies dizziness, syncope, seizures, numbness, tingling, tremor, tics, unsteady gait, slurred speech, confusion. Denies muscle or joint pain, stiffness, or dystonia.  Individual Medical History/ Review of Systems: Changes? :No     Past medications for mental health diagnoses include: Wellbutrin SR Wellbutrin XL, Zoloft caused falls, Cymbalta, naltrexone for post-COVID fog.  Allergies: Metformin and related, Wellbutrin [bupropion], and Zoloft [sertraline]  Current Medications:  Current Outpatient Medications:    acetaminophen (TYLENOL) 500 MG tablet, , Disp: , Rfl:    amLODipine (NORVASC) 5 MG tablet, Take 1 tablet by mouth daily., Disp: , Rfl:    b complex vitamins  capsule, Take 1 capsule by mouth daily., Disp: , Rfl:    cholecalciferol (VITAMIN D) 1000 units tablet, Take 5,000 Units by mouth in the morning and at bedtime., Disp: , Rfl:    ELDERBERRY PO, Take 2 each by mouth daily. Gummies, Disp: , Rfl:    famotidine (PEPCID) 10 MG tablet, Take 10 mg by mouth daily. , Disp: , Rfl:    ferrous sulfate 325 (65 FE) MG tablet, Take 325 mg by mouth daily with breakfast., Disp: , Rfl:    JARDIANCE 10 MG TABS tablet, Take 10 mg by mouth daily., Disp: , Rfl:    lamoTRIgine (LAMICTAL) 25 MG tablet, Take 3 tablets by mouth once daily, Disp: 270 tablet, Rfl: 0   potassium chloride SA (K-DUR,KLOR-CON) 20 MEQ tablet, Take 20 mEq by mouth daily. , Disp: , Rfl: 3   sildenafil (REVATIO) 20 MG tablet, , Disp: , Rfl:    atorvastatin (LIPITOR) 10 MG tablet, , Disp: , Rfl:    empagliflozin (JARDIANCE) 25 MG TABS tablet, Take 1 tablet (25 mg total) by mouth daily before breakfast. (Patient not taking: Reported on 06/30/2020), Disp: 30 tablet, Rfl: 11   Tamsulosin HCl (FLOMAX) 0.4 MG CAPS, Take 1 capsule (0.4 mg total) by mouth daily after breakfast. (Patient not taking: Reported on 06/30/2020), Disp: 30 capsule, Rfl: 0   traMADol (ULTRAM) 50 MG tablet, Take 1-2 tablets (50-100 mg total) by mouth every 6 (six) hours as needed for moderate pain or severe pain. (Patient not taking: Reported on 06/30/2020), Disp: 20 tablet, Rfl: 0   Turmeric Curcumin 500 MG CAPS, , Disp: ,  Rfl:  Medication Side Effects: none  Family Medical/ Social History: Changes? no  MENTAL HEALTH EXAM:  There were no vitals taken for this visit.There is no height or weight on file to calculate BMI.  General Appearance: Casual and Well Groomed  Eye Contact:  Good  Speech:  Clear and Coherent and Normal Rate  Volume:  Normal  Mood:  Euthymic  Affect:  Congruent  Thought Process:  Goal Directed and Descriptions of Associations: Circumstantial  Orientation:  Full (Time, Place, and Person)  Thought Content:  Logical   Suicidal Thoughts:  No  Homicidal Thoughts:  No  Memory:  Immediate;   Fair Remote;   Good  Judgement:  Good  Insight:  Good  Psychomotor Activity:  Normal  Concentration:  Concentration: Good and Attention Span: Good  Recall:  Good  Fund of Knowledge: Good  Language: Good  Assets:  Desire for Improvement Financial Resources/Insurance Housing Transportation  ADL's:  Intact  Cognition: WNL  Prognosis:  Good   DIAGNOSES:    ICD-10-CM   1. 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 Malaise and fatigue  R53.81    R53.83     3. Obstructive sleep apnea syndrome  G47.33       Receiving Psychotherapy: Yes   Dr. Jonni Sanger [place]  RECOMMENDATIONS:  PDMP was reviewed.  No results available. I provided 20 minutes of face to face time during this encounter, including time spent before and after the visit in records review, medical decision making, counseling pertinent to today's visit, and charting.   Recommend that he tell his PCP about the fatigue.  It may be related to anemia that is requiring oral iron. As far as the Lamictal goes I prefer not to increase it, at least until we know whether or not his symptoms are caused by a physiologic problem.  Continue Lamictal 25 mg, 3 p.o. nightly. Continue vitamins as listed on med list. Continue CPAP use. Continue therapy with Dr. [name]. Return in 3-4 months.  [name], PA-C

## 2021-12-07 ENCOUNTER — Ambulatory Visit: Payer: Medicare Other

## 2021-12-13 ENCOUNTER — Ambulatory Visit: Payer: Self-pay

## 2021-12-13 ENCOUNTER — Ambulatory Visit: Payer: Medicare Other | Admitting: Psychiatry

## 2021-12-13 DIAGNOSIS — Z630 Problems in relationship with spouse or partner: Secondary | ICD-10-CM

## 2021-12-13 DIAGNOSIS — F3341 Major depressive disorder, recurrent, in partial remission: Secondary | ICD-10-CM | POA: Diagnosis not present

## 2021-12-13 DIAGNOSIS — G9332 Myalgic encephalomyelitis/chronic fatigue syndrome: Secondary | ICD-10-CM

## 2021-12-13 DIAGNOSIS — F5104 Psychophysiologic insomnia: Secondary | ICD-10-CM

## 2021-12-13 DIAGNOSIS — U099 Post covid-19 condition, unspecified: Secondary | ICD-10-CM

## 2021-12-13 DIAGNOSIS — G4733 Obstructive sleep apnea (adult) (pediatric): Secondary | ICD-10-CM
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
## 2021-12-21 ENCOUNTER — Ambulatory Visit (INDEPENDENT_AMBULATORY_CARE_PROVIDER_SITE_OTHER): Payer: Medicare Other

## 2021-12-21 DIAGNOSIS — M2021 Hallux rigidus, right foot: Secondary | ICD-10-CM | POA: Diagnosis not present

## 2021-12-21 DIAGNOSIS — M2022 Hallux rigidus, left foot: Secondary | ICD-10-CM

## 2021-12-21 DIAGNOSIS — E1149 Type 2 diabetes mellitus with other diabetic neurological complication: Secondary | ICD-10-CM

## 2021-12-21 NOTE — Progress Notes (Signed)
Patient presents today to pick up diabetic shoes and insoles.  Patient was dispensed 1 pair of diabetic shoes and 3 pairs of foam casted diabetic insoles. Fit was satisfactory. Instructions for break-in and wear was reviewed and a copy was given to the patient.   Re-appointment for regularly scheduled diabetic foot care visits or if they should experience any trouble with the shoes or insoles.  

## 2022-01-12 ENCOUNTER — Ambulatory Visit: Payer: Medicare Other | Admitting: Psychiatry

## 2022-01-12 DIAGNOSIS — F5104 Psychophysiologic insomnia: Secondary | ICD-10-CM

## 2022-01-12 DIAGNOSIS — U099 Post covid-19 condition, unspecified: Secondary | ICD-10-CM

## 2022-01-12 DIAGNOSIS — F3341 Major depressive disorder, recurrent, in partial remission: Secondary | ICD-10-CM

## 2022-01-12 DIAGNOSIS — G4733 Obstructive sleep apnea (adult) (pediatric): Secondary | ICD-10-CM

## 2022-01-12 DIAGNOSIS — Z630 Problems in relationship with spouse or partner: Secondary | ICD-10-CM | POA: Diagnosis not present

## 2022-01-12 DIAGNOSIS — G9332 Myalgic encephalomyelitis/chronic fatigue syndrome: Secondary | ICD-10-CM | POA: Diagnosis not present

## 2022-01-12 NOTE — Progress Notes (Signed)
Psychotherapy Progress Note [facility] [name], PhD LP  Patient ID: Antonio [name] [facility] "Antonio [name]")    MRN: [id number] Therapy format: Family therapy w/ patient -- accompanied by Cecille Po Date: 01/12/2022      Start: 4:07p     Stop: 4:53p     Time Spent: 46 min Location: In-person   Session narrative (presenting needs, interim history, self-report of stressors and symptoms, applications of prior therapy, status changes, and interventions made in session) On [name]'s account, they lost a dog, then lost a gift card, and she had to take a bunch of guff from [place] about whether the card was really there and she just missed it.  Infuriating.  Similar irritability with going to the neighbors to drop off a cake.  Tried to redirect the discussion, allowed to vent.  Addressed [name] overworrying about money right now -- albeit fending off [name]'s effort to comment how ironic that is, given his history of wasting and spending on himself.  [name] is working, but she only gets 2 hrs sleep, partly for work, partly for a pile of seasonal duties she keeps up with.  Gently encouraged her to make sure she takes adequate care of her own system, that even with the energy and drive she seems to have, sleep loss has its own effects for everybody, whether it's fatigue, loss of concentration, or irritability.  [name] switches to c/o brain fog again, "ever since I got the COVID".  [name] offers another theory that [name] is unhappy because he let off trying to do the things he's supposed to.  Brokered a truce trying to break the cycle of attack and defend, both recommit to just doing the next constructive thing, especially in the season they're in.  Condolences on loss of a dog.  Therapeutic modalities: Cognitive Behavioral Therapy, Solution-Oriented/Positive Psychology, and Interpersonal  Mental Status/Observations:  Appearance:   Casual     Behavior:  Appropriate  Motor:  Normal  Speech/Language:    Clear and Coherent  Affect:  Appropriate  Mood:  dysthymic  Thought process:  concrete  Thought content:    WNL  Sensory/Perceptual disturbances:    WNL  Orientation:  Fully oriented  Attention:  Good    Concentration:  Good  Memory:  grossly intact  Insight:    Fair  Judgment:   Fair  Impulse Control:  Fair   Risk Assessment: Danger to Self: No Self-injurious Behavior: No Danger to Others: No Physical Aggression / Violence: No Duty to Warn: No Access to Firearms a concern: No  Assessment of progress:  situational setback(s)  Diagnosis:   ICD-10-CM   1. 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 Relationship problem between partners  Z63.0     3. 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 Obstructive sleep apnea syndrome  G47.33      Plan:  Communication/conflict Continue general approach trying to be kinder on the first reaction, giving grace first with [name] Keep trying to get ahead of temper and say you feel frustrated rather than just play it out.  Find 5/day to say "I'm frustrated" and check perceptions before reacting to what he believes he hears from [place] For encouragement's sake, if desired, ask [name] if she can tell when he is trying to keep from popping off, out of respect for her, or otherwise trying  to respect her better Look for opportunities to establish a little quality time with [name], e.g., [employer] show, and try just touching lightly to get used to it again, e.g., hold hands [name] encouraged to stop sleeping on the floor, return to the bed (if she can do it without feeling like she is selling out her own protest) [name] make priority of asking/getting Antonio [name]'s attention before expecting it -- he is older, long in the habit of self-absorption, and it's good form anyway Continue abstinence from addictive habits with knife-buying and cross-dressing Continue joint  sessions as able Brain fog and better energy in the daytime Work on sleep readiness by making sure stimulants out of the way well before bedtime.   Stop coffee by 3pm if at all possible Turn the lights down about 9pm Go on to bed by midnight, no matter what Kidney care, including reducing sodas Bring up light as early as possible Practice 5-7 big, deep breaths morning and anytime need a refresher Get adequate activity/movement -- resume walking or stationary bike if it suits Other sleep issues Hold down evening fluids Address pain as needed Make sure CPAP is adjusted and maintained correctly Dog care Try to work ahead in the day, don't procrastinate, do a little something when thinking of it Switch from yelling to using a noisemaker Other recommendations/advice as may be noted above Continue to utilize previously learned skills ad lib Maintain medication as prescribed and work faithfully with relevant prescriber(s) if any changes are desired or seem indicated Call the clinic on-call service, 988/hotline, 911, or present to [facility] or ER if any life-threatening psychiatric crisis Return for time as available. Already scheduled visit in this office 02/17/2022.  Blanchie Serve, PhD [name], PhD LP Clinical Psychologist, [facility] Group [facility] [address] [place], [place] [address] [phone]

## 2022-01-23 ENCOUNTER — Other Ambulatory Visit: Payer: Self-pay | Admitting: Physician Assistant

## 2022-02-17 ENCOUNTER — Telehealth: Payer: Self-pay | Admitting: Physician Assistant

## 2022-02-17 ENCOUNTER — Other Ambulatory Visit: Payer: Self-pay | Admitting: Physician Assistant

## 2022-02-17 ENCOUNTER — Ambulatory Visit: Payer: Medicare Other | Admitting: Psychiatry

## 2022-02-17 DIAGNOSIS — Z630 Problems in relationship with spouse or partner: Secondary | ICD-10-CM | POA: Diagnosis not present

## 2022-02-17 DIAGNOSIS — G4733 Obstructive sleep apnea (adult) (pediatric): Secondary | ICD-10-CM | POA: Diagnosis not present

## 2022-02-17 DIAGNOSIS — F5104 Psychophysiologic insomnia: Secondary | ICD-10-CM

## 2022-02-17 DIAGNOSIS — U099 Post covid-19 condition, unspecified: Secondary | ICD-10-CM

## 2022-02-17 DIAGNOSIS — F3341 Major depressive disorder, recurrent, in partial remission: Secondary | ICD-10-CM | POA: Diagnosis not present

## 2022-02-17 DIAGNOSIS — G9332 Myalgic encephalomyelitis/chronic fatigue syndrome: Secondary | ICD-10-CM

## 2022-02-17 MED ORDER — LAMOTRIGINE 100 MG PO TABS: 100.0000 mg | ORAL_TABLET | Freq: Every day | ORAL | 1 refills | Status: DC

## 2022-02-17 NOTE — Telephone Encounter (Signed)
Patient notified

## 2022-02-17 NOTE — Telephone Encounter (Signed)
Patient spoke with me in the hall after seeing Dr. Rica Mote. He asked if he could go up on his medicine. Please give him a call and let him know I sent in an Rx to his [facility], for Lamictal 100 mg. Have him take one daily. (This is increased from total 75 mg) He has an appt w/ me 02/24/2022 so we'll discuss further then.  Thanks
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
## 2022-02-24 ENCOUNTER — Encounter: Payer: Self-pay | Admitting: Physician Assistant

## 2022-02-24 ENCOUNTER — Ambulatory Visit (INDEPENDENT_AMBULATORY_CARE_PROVIDER_SITE_OTHER): Payer: Medicare Other | Admitting: Physician Assistant

## 2022-02-24 DIAGNOSIS — G4733 Obstructive sleep apnea (adult) (pediatric): Secondary | ICD-10-CM | POA: Diagnosis not present

## 2022-02-24 DIAGNOSIS — Z72821 Inadequate sleep hygiene: Secondary | ICD-10-CM | POA: Diagnosis not present

## 2022-02-24 DIAGNOSIS — F331 Major depressive disorder, recurrent, moderate: Secondary | ICD-10-CM

## 2022-02-24 NOTE — Progress Notes (Signed)
[facility]  Patient ID: Antonio [name],  MRN: [id number]  PCP: Leeroy Cha, MD  Date of Evaluation: 02/24/2022 Time spent:20 minutes  Chief Complaint:  Chief Complaint   Depression; Follow-up    HISTORY/CURRENT STATUS: For routine med check.   We increased Lamictal last week. "I woke up in a good mood this morning. It might be working already," he said with a laugh. Had been in a funk, very negative all the time, low energy and is slower taking care of their foster dogs. Appetite is nl and weight is stable. Personal hygiene is nl. Not crying easily. Still forgets things easily but no worse. Stays up late by choice, like 2 AM, doesn't always nap during the day but he does sometimes. No anxiety to speak of.  No SI/HI.   Patient denies increased energy with decreased need for sleep, increased talkativeness, racing thoughts, impulsivity or risky behaviors, increased spending, increased libido, grandiosity, increased irritability or anger, paranoia, or hallucinations.  Denies dizziness, syncope, seizures, numbness, tingling, tremor, tics, unsteady gait, slurred speech, confusion. Denies muscle or joint pain, stiffness, or dystonia.  Individual Medical History/ Review of Systems: Changes? :No     Past medications for mental health diagnoses include: Wellbutrin SR Wellbutrin XL, Zoloft caused falls, Cymbalta, naltrexone for post-COVID fog.  Allergies: Metformin and related, Wellbutrin [bupropion], and Zoloft [sertraline]  Current Medications:  Current Outpatient Medications:    acetaminophen (TYLENOL) 500 MG tablet, , Disp: , Rfl:    amLODipine (NORVASC) 5 MG tablet, Take 1 tablet by mouth daily., Disp: , Rfl:    b complex vitamins capsule, Take 1 capsule by mouth daily., Disp: , Rfl:    cholecalciferol (VITAMIN D) 1000 units tablet, Take 5,000 Units by mouth in the morning and at bedtime., Disp: , Rfl:    ELDERBERRY PO, Take 2 each by mouth daily. Gummies,  Disp: , Rfl:    famotidine (PEPCID) 10 MG tablet, Take 10 mg by mouth daily. , Disp: , Rfl:    ferrous sulfate 325 (65 FE) MG tablet, Take 325 mg by mouth daily with breakfast., Disp: , Rfl:    JARDIANCE 10 MG TABS tablet, Take 10 mg by mouth daily., Disp: , Rfl:    lamoTRIgine (LAMICTAL) 100 MG tablet, Take 1 tablet (100 mg total) by mouth daily., Disp: 90 tablet, Rfl: 1   potassium chloride SA (K-DUR,KLOR-CON) 20 MEQ tablet, Take 20 mEq by mouth daily. , Disp: , Rfl: 3   sildenafil (REVATIO) 20 MG tablet, , Disp: , Rfl:    Turmeric Curcumin 500 MG CAPS, , Disp: , Rfl:    UNABLE TO FIND, CPAP, Disp: , Rfl:    atorvastatin (LIPITOR) 10 MG tablet, , Disp: , Rfl:    empagliflozin (JARDIANCE) 25 MG TABS tablet, Take 1 tablet (25 mg total) by mouth daily before breakfast. (Patient not taking: Reported on 06/30/2020), Disp: 30 tablet, Rfl: 11   Tamsulosin HCl (FLOMAX) 0.4 MG CAPS, Take 1 capsule (0.4 mg total) by mouth daily after breakfast. (Patient not taking: Reported on 06/30/2020), Disp: 30 capsule, Rfl: 0   traMADol (ULTRAM) 50 MG tablet, Take 1-2 tablets (50-100 mg total) by mouth every 6 (six) hours as needed for moderate pain or severe pain. (Patient not taking: Reported on 06/30/2020), Disp: 20 tablet, Rfl: 0 Medication Side Effects: none  Family Medical/ Social History: Changes? no  MENTAL HEALTH EXAM:  There were no vitals taken for this visit.There is no height or weight on file to calculate BMI.  General Appearance: Casual and Well Groomed  Eye Contact:  Good  Speech:  Clear and Coherent and Normal Rate  Volume:  Normal  Mood:  Euthymic  Affect:  Congruent  Thought Process:  Goal Directed and Descriptions of Associations: Circumstantial  Orientation:  Full (Time, Place, and Person)  Thought Content: Logical   Suicidal Thoughts:  No  Homicidal Thoughts:  No  Memory:  Immediate;   Fair Remote;   Good  Judgement:  Good  Insight:  Good  Psychomotor Activity:  Normal  Concentration:   Concentration: Good and Attention Span: Good  Recall:  Good  Fund of Knowledge: Good  Language: Good  Assets:  Communication Skills Desire for Improvement Financial Resources/Insurance Housing Transportation  ADL's:  Intact  Cognition: WNL  Prognosis:  Good   DIAGNOSES:    ICD-10-CM   1. Major depressive disorder, recurrent episode, moderate (HCC)  F33.1     2. Obstructive sleep apnea syndrome  G47.33     3. Inadequate sleep hygiene  Z72.821       Receiving Psychotherapy: Yes   Dr. Jonni Sanger [place]  RECOMMENDATIONS:  PDMP was reviewed.  No controlled substances.  I provided 20 minutes of face to face time during this encounter, including time spent before and after the visit in records review, medical decision making, counseling pertinent to today's visit, and charting.   He hasn't been on the current dose of Lamictal long enough to see benefits. Will continue same dose and f/u soon. He understands and agrees.  Sleep hygiene again discussed.   Continue Lamictal 100 mg, 1 p.o. nightly. Continue vitamins as listed on med list. Continue CPAP use. Continue therapy with Dr. [name]. Return in 4-6 weeks.   [name], PA-C

## 2022-03-16 ENCOUNTER — Ambulatory Visit: Payer: Medicare Other | Admitting: Psychiatry

## 2022-03-23 ENCOUNTER — Ambulatory Visit (INDEPENDENT_AMBULATORY_CARE_PROVIDER_SITE_OTHER): Payer: Medicare Other | Admitting: Psychiatry

## 2022-03-23 DIAGNOSIS — Z630 Problems in relationship with spouse or partner: Secondary | ICD-10-CM | POA: Diagnosis not present

## 2022-03-23 DIAGNOSIS — F331 Major depressive disorder, recurrent, moderate: Secondary | ICD-10-CM | POA: Diagnosis not present

## 2022-03-23 DIAGNOSIS — Z72821 Inadequate sleep hygiene: Secondary | ICD-10-CM | POA: Diagnosis not present

## 2022-03-23 DIAGNOSIS — G9332 Myalgic encephalomyelitis/chronic fatigue syndrome: Secondary | ICD-10-CM

## 2022-03-23 DIAGNOSIS — Z789 Other specified health status: Secondary | ICD-10-CM

## 2022-03-23 DIAGNOSIS — U099 Post covid-19 condition, unspecified: Secondary | ICD-10-CM

## 2022-03-23 DIAGNOSIS — F651 Transvestic fetishism: Secondary | ICD-10-CM

## 2022-03-23 NOTE — Progress Notes (Signed)
Psychotherapy Progress Note [facility] [name], PhD LP  Patient ID: Antonio [name] [facility] "Antonio [name]")    MRN: [id number] Therapy format: Family therapy w/ patient -- accompanied by Cecille Po Date: 03/23/2022      Start: 3:10p     Stop: 4:00p     Time Spent: 50 min Location: In-person   Session narrative (presenting needs, interim history, self-report of stressors and symptoms, applications of prior therapy, status changes, and interventions made in session) Antonio [name] pops the question why no sex in almost 15 years, and uphill for 43 years for romantic feelings and actions, 22 years celibate, maybe 50 times total in all that time.  Allowed to answer for himself, routine answer is "I don't know."  With permission, reviewed prior interpretations -- at this point, age 1 may have something to do with it, as well as COVID and loss of friends; in times past, unconfidence as a small-statured man, possible sense of being bully-able, learned aversion with first wife, and shame over his paraphilia with women's clothing and history of spending secretly.  Able to turn the conversation back to motivation to do something for the common good and to recapture enjoyable time -- which may or may not lead to sex.  Advocated listening to old favorite music on tape and album, including cool jazz, and several other more shared interests.  Refreshed awareness that [name] can basically play anything he asks for.  Continued to encourage also getting through night tasks with the dogs expeditiously, refusing to take any long rests with the phone or other tasks, and to back up his caffeine curfew (e.g., by making decaf in the evening).  Pt seemed more excited than usual about the prospects this time, perhaps for more successfully framing opportunities over obligations.  Therapeutic modalities: Cognitive Behavioral Therapy and Solution-Oriented/Positive Psychology  Mental Status/Observations:  Appearance:    Casual     Behavior:  Appropriate  Motor:  Normal  Speech/Language:   Clear and Coherent  Affect:  Appropriate  Mood:  dysthymic  Thought process:  concrete  Thought content:    WNL  Sensory/Perceptual disturbances:    WNL  Orientation:  Fully oriented  Attention:  Good    Concentration:  Good  Memory:  WNL  Insight:    Fair  Judgment:   Good  Impulse Control:  Fair   Risk Assessment: Danger to Self: No Self-injurious Behavior: No Danger to Others: No Physical Aggression / Violence: No Duty to Warn: No Access to Firearms a concern: No  Assessment of progress:  stabilized  Diagnosis:   ICD-10-CM   1. Major depressive disorder, recurrent episode, moderate (HCC)  F33.1     2. Inadequate sleep hygiene  Z72.821     3. Relationship problem between partners  Z63.0     4. 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  Find 5/day to say "I'm frustrated" and check perceptions before reacting to what he believes he hears from [name] For encouragement's sake, if desired, ask [name] if  she can tell when he is trying to keep from popping off, out of respect for her, or otherwise trying to respect her better Look for opportunities to establish a little quality time with [name], e.g., [employer] show, and try just touching lightly to get used to it again, e.g., hold hands [name] encouraged to stop sleeping on the floor, return to the bed (if she can do it without feeling like she is selling out her own protest) [name] make priority of asking/getting Antonio [name]'s  attention before expecting it -- he is older, long in the habit of self-absorption, and it's good form anyway Continue abstinence from addictive habits with knife-buying and cross-dressing Continue joint sessions as able Physiological interventions for pain, fatigue, sleep, and depression Add omega 3 for added antiinflammatory Try melatonin 1-2 hrs before sleep, 3-'5mg'$ .  At least by 10:30pm., and consent to go to bed earlier. Carb curfew before bed, OK to snack earlier Sleep, brain fog, and energy regulation Work on sleep readiness by making sure stimulants out of the way well before bedtime.  Stop coffee by 3pm if at all possible. Turn the lights down about 9pm, bring up light as early as possible Go on to bed by midnight, no matter what Get adequate activity/movement -- resume walking or stationary bike if it suits Practice 5-7 big, deep breaths morning and anytime need a refresher Kidney care, including reducing sodas and good fluid by day, hold down late evening fluids Address pain as needed Make sure CPAP is adjusted and maintained correctly Enable earlier bed time by pressing through dog care tasks Dog care Try to work ahead in the day, do a little something when thinking of it Switch from yelling to using a noisemaker for negative attention Other recommendations/advice as may be noted above Continue to utilize previously learned skills ad lib Maintain medication as prescribed and work faithfully with relevant prescriber(s) if any changes are desired or seem indicated Call the clinic on-call service, 988/hotline, 911, or present to [facility] or ER if any life-threatening psychiatric crisis Return in about 1 month (around 04/21/2022) for time as available up to 1 mo. Already scheduled visit in this office 04/06/2022.  Blanchie Serve, PhD [name], PhD LP Clinical Psychologist, [facility] [address], [place],  [place] [address] [phone]

## 2022-04-06 ENCOUNTER — Ambulatory Visit (INDEPENDENT_AMBULATORY_CARE_PROVIDER_SITE_OTHER): Payer: Medicare Other | Admitting: Psychiatry

## 2022-04-06 DIAGNOSIS — Z630 Problems in relationship with spouse or partner: Secondary | ICD-10-CM | POA: Diagnosis not present

## 2022-04-06 DIAGNOSIS — F5104 Psychophysiologic insomnia: Secondary | ICD-10-CM | POA: Diagnosis not present

## 2022-04-06 DIAGNOSIS — G9332 Myalgic encephalomyelitis/chronic fatigue syndrome: Secondary | ICD-10-CM

## 2022-04-06 DIAGNOSIS — F331 Major depressive disorder, recurrent, moderate: Secondary | ICD-10-CM | POA: Diagnosis not present

## 2022-04-06 DIAGNOSIS — F651 Transvestic fetishism: Secondary | ICD-10-CM

## 2022-04-06 DIAGNOSIS — G4733 Obstructive sleep apnea (adult) (pediatric): Secondary | ICD-10-CM

## 2022-04-06 DIAGNOSIS — U099 Post covid-19 condition, unspecified: Secondary | ICD-10-CM

## 2022-04-06 DIAGNOSIS — Z72821 Inadequate sleep hygiene: Secondary | ICD-10-CM

## 2022-04-06 NOTE — Progress Notes (Signed)
Psychotherapy Progress Note [facility] [name], PhD LP  Patient ID: Antonio [name] [facility] "Antonio [name]")    MRN: [id number] Therapy format: Family therapy w/ patient -- accompanied by Antonio [name] Date: 04/06/2022      Start: 4:16p     Stop: 5:04p     Time Spent: 48 min Location: In-person   Session narrative (presenting needs, interim history, self-report of stressors and symptoms, applications of prior therapy, status changes, and interventions made in session) Been more affectionate recently, and it was going well, until Antonio [name] relapsed in cross-dressing and got caught wearing Antonio [name]'s bra.  Doesn't much know why he did it, just did.  Discussed at length, reiterating interpretations past concerning getting a thrill out of the taboo, old habit of self-pleasure via the fetish, and defending against the threat of intimacy/vulnerability by offending again.  Confirmed he is not presently motivated to dress up again, understands how it would bother Antonio [name], especially if his fetish does not even ready him to be turned on with her.  For her part, Antonio [name] is pretty understanding, just wants him to quit it and re-engage her more like in the early days of their relationship.  Reaffirmed and encouraged in all of the above efforts to manage sleep time and promote constructive time together.  Therapeutic modalities: Cognitive Behavioral Therapy, Solution-Oriented/Positive Psychology, Environmental manager, and Motivational Interviewing  Mental Status/Observations:  Appearance:   Casual     Behavior:  Appropriate and contrite  Motor:  Normal  Speech/Language:   Clear and Coherent  Affect:  Appropriate  Mood:  downcast  Thought process:  normal  Thought content:    WNL  Sensory/Perceptual disturbances:    WNL  Orientation:  Fully oriented  Attention:  Good    Concentration:  Fair  Memory:  grossly intact  Insight:    Fair  Judgment:   Fair  Impulse Control:  Fair   Risk Assessment: Danger  to Self: No Self-injurious Behavior: No Danger to Others: No Physical Aggression / Violence: No Duty to Warn: No Access to Firearms a concern: No  Assessment of progress:  situational setback(s)  Diagnosis:   ICD-10-CM   1. Major depressive disorder, recurrent episode, moderate (HCC)  F33.1     2. Transvestic fetishism  F65.1     3. Relationship problem between partners  Z63.0     4. 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 Inadequate sleep hygiene  Z72.821     6. 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 Obstructive sleep apnea syndrome  G47.33      Plan:  Communication/conflict/relationship Pull out old favorite music and activities, soon, including asking [name] to play cool jazz Continue general approach trying to be kinder on the first reaction, giving grace first with Antonio [name], not conditional on how he feels treated Keep trying to get ahead of temper and say you feel frustrated rather than just play it out.  Find 5/day to say "I'm frustrated" and check perceptions before reacting to what he believes he hears from Antonio [name] For encouragement's sake, if desired, ask Antonio [name] if she can tell when he is trying to keep from popping off, out of respect for her, or otherwise trying to respect her better Look for opportunities to establish a little quality time with Antonio [name], e.g., [employer] show, and try just touching lightly to get used to it again, e.g., hold hands Antonio [name] encouraged to stop sleeping on the floor, return  to the bed (if she can do it without feeling like she is selling out her own protest) Antonio [name] make priority of asking/getting Antonio [name]'s attention before expecting it -- he is older, long in the habit of self-absorption, and it's good form anyway Continue abstinence from addictive habits of knife-buying and cross-dressing.  Use other ways of self-soothing, preferably approach Antonio [name] for touch time and work on trusting her better to approach, in order to recruit  normal sexual interest instead Continue joint sessions as able Physiological interventions for pain, fatigue, sleep, and energy regulation Add omega 3 for added antiinflammatory Try melatonin 1-2 hrs before sleep, 3-5mg .  At least by 10:30pm., and consent to go to bed earlier, say, by midnight. Carb curfew before bed, OK to snack earlier Work on sleep readiness by making sure stimulants out of the way well before bedtime.  Stop coffee by 3pm if at all possible. Turn the lights down about 9pm, bring up light as early as possible Go on to bed by midnight, no matter what Get adequate activity/movement -- resume walking or stationary bike if it suits Practice 5-7 big, deep breaths morning and anytime need a refresher Kidney care, including reducing sodas and good fluid by day, hold down late evening fluids Address pain as needed Make sure CPAP is adjusted and maintained correctly Enable earlier bed time by pressing through dog care tasks Dog care Try to work ahead in the day, do a little something when thinking of it Try to knock out dog care in 1-2 sittings, not interrupt repeatedly Switch from yelling to using a noisemaker for negative attention Other recommendations/advice as may be noted above Continue to utilize previously learned skills ad lib Maintain medication as prescribed and work faithfully with relevant prescriber(s) if any changes are desired or seem indicated Call the clinic on-call service, 988/hotline, 911, or present to [facility] or ER if any life-threatening psychiatric crisis Return for avail earlier @ PT's need, time as already scheduled. Already scheduled visit in this office 04/07/2022.  Robley Fries, PhD [name], PhD LP Clinical Psychologist, [facility] [address] [place], [place] [address] [phone]

## 2022-04-07 ENCOUNTER — Encounter: Payer: Self-pay | Admitting: Physician Assistant

## 2022-04-07 ENCOUNTER — Ambulatory Visit: Payer: Medicare Other | Admitting: Physician Assistant

## 2022-04-07 DIAGNOSIS — Z72821 Inadequate sleep hygiene: Secondary | ICD-10-CM | POA: Diagnosis not present

## 2022-04-07 DIAGNOSIS — G4733 Obstructive sleep apnea (adult) (pediatric): Secondary | ICD-10-CM

## 2022-04-07 DIAGNOSIS — F3341 Major depressive disorder, recurrent, in partial remission: Secondary | ICD-10-CM

## 2022-04-07 NOTE — Progress Notes (Unsigned)
[facility] Med Check  Patient ID: RIO GIAMMANCO,  MRN: [id number]  PCP: Leeroy Cha, MD  Date of Evaluation: 04/07/2022 Time spent:20 minutes  Chief Complaint:  Chief Complaint   Follow-up    HISTORY/CURRENT STATUS: For routine med check.   Tired a lot for the past 3 weeks or so. Not sleeping as well as he should and not sure why. Not practicing good sleep hygiene though but that hasn't changed. Stays up late, takes care of their 15 dogs which takes awhile. Falls asleep on the couch during the day. But says he doesn't take naps. Might get about 6 hours, trouble falling asleep and staying asleep.  He adamantly uses his CPAP.  Patient is able to enjoy things.  Energy and motivation are good, except for the past few weeks, he's been tired from not sleeping well.   No extreme sadness, tearfulness, or feelings of hopelessness.  ADLs and personal hygiene are normal.   Denies any changes in concentration, making decisions, or remembering things.  Appetite has not changed.  Weight is stable. No c/o anxiety.  Denies suicidal or homicidal thoughts.  Patient denies increased energy with decreased need for sleep, increased talkativeness, racing thoughts, impulsivity or risky behaviors, increased spending, increased libido, grandiosity, increased irritability or anger, paranoia, or hallucinations.  Denies dizziness, syncope, seizures, numbness, tingling, tremor, tics, unsteady gait, slurred speech, confusion. Denies muscle or joint pain, stiffness, or dystonia.  Individual Medical History/ Review of Systems: Changes? :No     Past medications for mental health diagnoses include: Wellbutrin SR Wellbutrin XL, Zoloft caused falls, Cymbalta, naltrexone for post-COVID fog.  Allergies: Metformin and related, Wellbutrin [bupropion], and Zoloft [sertraline]  Current Medications:  Current Outpatient Medications:    acetaminophen (TYLENOL) 500 MG tablet, , Disp: , Rfl:    amLODipine  (NORVASC) 5 MG tablet, Take 1 tablet by mouth daily., Disp: , Rfl:    b complex vitamins capsule, Take 1 capsule by mouth daily., Disp: , Rfl:    cholecalciferol (VITAMIN D) 1000 units tablet, Take 5,000 Units by mouth in the morning and at bedtime., Disp: , Rfl:    ELDERBERRY PO, Take 2 each by mouth daily. Gummies, Disp: , Rfl:    famotidine (PEPCID) 10 MG tablet, Take 10 mg by mouth daily. , Disp: , Rfl:    ferrous sulfate 325 (65 FE) MG tablet, Take 325 mg by mouth daily with breakfast., Disp: , Rfl:    JARDIANCE 10 MG TABS tablet, Take 10 mg by mouth daily., Disp: , Rfl:    lamoTRIgine (LAMICTAL) 100 MG tablet, Take 1 tablet (100 mg total) by mouth daily., Disp: 90 tablet, Rfl: 1   potassium chloride SA (K-DUR,KLOR-CON) 20 MEQ tablet, Take 20 mEq by mouth daily. , Disp: , Rfl: 3   sildenafil (REVATIO) 20 MG tablet, , Disp: , Rfl:    Turmeric Curcumin 500 MG CAPS, , Disp: , Rfl:    UNABLE TO FIND, CPAP, Disp: , Rfl:    atorvastatin (LIPITOR) 10 MG tablet, , Disp: , Rfl:    empagliflozin (JARDIANCE) 25 MG TABS tablet, Take 1 tablet (25 mg total) by mouth daily before breakfast. (Patient not taking: Reported on 06/30/2020), Disp: 30 tablet, Rfl: 11   Tamsulosin HCl (FLOMAX) 0.4 MG CAPS, Take 1 capsule (0.4 mg total) by mouth daily after breakfast. (Patient not taking: Reported on 06/30/2020), Disp: 30 capsule, Rfl: 0   traMADol (ULTRAM) 50 MG tablet, Take 1-2 tablets (50-100 mg total) by mouth every 6 (six) hours  as needed for moderate pain or severe pain. (Patient not taking: Reported on 06/30/2020), Disp: 20 tablet, Rfl: 0 Medication Side Effects: none  Family Medical/ Social History: Changes? no  MENTAL HEALTH EXAM:  There were no vitals taken for this visit.There is no height or weight on file to calculate BMI.  General Appearance: Casual and Well Groomed  Eye Contact:  Good  Speech:  Clear and Coherent and Normal Rate  Volume:  Normal  Mood:  Euthymic  Affect:  Congruent  Thought  Process:  Goal Directed and Descriptions of Associations: Circumstantial  Orientation:  Full (Time, Place, and Person)  Thought Content: Logical   Suicidal Thoughts:  No  Homicidal Thoughts:  No  Memory:  Immediate;   Fair Remote;   Good  Judgement:  Good  Insight:  Good  Psychomotor Activity:  Normal  Concentration:  Concentration: Good and Attention Span: Good  Recall:  Good  Fund of Knowledge: Good  Language: Good  Assets:  Desire for Improvement Financial Resources/Insurance Housing Transportation  ADL's:  Intact  Cognition: WNL  Prognosis:  Good   DIAGNOSES:    ICD-10-CM   1. 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 Inadequate sleep hygiene  Z72.821     3. Obstructive sleep apnea syndrome  G47.33       Receiving Psychotherapy: Yes   Dr. Jonni Sanger [place]  RECOMMENDATIONS:  PDMP was reviewed.  No controlled substances.  I provided 20 minutes of face to face time during this encounter, including time spent before and after the visit in records review, medical decision making, counseling pertinent to today's visit, and charting.   Sleep hygiene again discussed. No naps during the day.   Continue Lamictal 100 mg, 1 p.o. nightly. Continue vitamins as listed on med list. Continue CPAP use. Continue therapy with Dr. [name]. Return in 3 months.    [name], PA-C

## 2022-04-19 ENCOUNTER — Other Ambulatory Visit (HOSPITAL_COMMUNITY): Payer: Self-pay | Admitting: Nephrology

## 2022-04-19 DIAGNOSIS — N2889 Other specified disorders of kidney and ureter: Secondary | ICD-10-CM

## 2022-05-04 ENCOUNTER — Ambulatory Visit (HOSPITAL_COMMUNITY)
Admission: RE | Admit: 2022-05-04 | Discharge: 2022-05-04 | Disposition: A | Discharge status: Home / Self Care | Payer: Medicare Other | Source: Ambulatory Visit | Attending: Nephrology | Admitting: Nephrology

## 2022-05-04 DIAGNOSIS — N2889 Other specified disorders of kidney and ureter: Secondary | ICD-10-CM | POA: Insufficient documentation

## 2022-05-04 MED ORDER — GADOBUTROL 1 MMOL/ML IV SOLN
6.2000 mL | Freq: Once | INTRAVENOUS | Status: AC | PRN
  Administered 2022-05-04: 6.2 mL via INTRAVENOUS

## 2022-05-19 ENCOUNTER — Ambulatory Visit (INDEPENDENT_AMBULATORY_CARE_PROVIDER_SITE_OTHER): Payer: Medicare Other | Admitting: Psychiatry

## 2022-05-19 DIAGNOSIS — F3341 Major depressive disorder, recurrent, in partial remission: Secondary | ICD-10-CM | POA: Diagnosis not present

## 2022-05-19 DIAGNOSIS — Z72821 Inadequate sleep hygiene: Secondary | ICD-10-CM

## 2022-05-19 DIAGNOSIS — G9332 Myalgic encephalomyelitis/chronic fatigue syndrome: Secondary | ICD-10-CM

## 2022-05-19 DIAGNOSIS — G4733 Obstructive sleep apnea (adult) (pediatric): Secondary | ICD-10-CM

## 2022-05-19 DIAGNOSIS — F651 Transvestic fetishism: Secondary | ICD-10-CM

## 2022-05-19 DIAGNOSIS — Z630 Problems in relationship with spouse or partner: Secondary | ICD-10-CM | POA: Diagnosis not present

## 2022-05-19 DIAGNOSIS — F5104 Psychophysiologic insomnia: Secondary | ICD-10-CM | POA: Diagnosis not present

## 2022-05-19 DIAGNOSIS — U099 Post covid-19 condition, unspecified: Secondary | ICD-10-CM

## 2022-05-19 NOTE — Progress Notes (Signed)
Psychotherapy Progress Note [facility] [name], PhD LP  Patient ID: Antonio [name] [facility] "Antonio [name]")    MRN: [id number] Therapy format: Family therapy w/ patient -- accompanied by [name] Date: 05/19/2022      Start: 3:19p     Stop: 4:06p     Time Spent: 47 min Location: In-person   Session narrative (presenting needs, interim history, self-report of stressors and symptoms, applications of prior therapy, status changes, and interventions made in session) 6 wks ago revealed loosening up and taking chances to be more affectionate and having relapsed in cross-dressing.  At this point, [name] says she thinks Antonio [name]'s problem is "he's not right with God".  C/o him being irritable again, stomping his foot, mouthing off in frustration.  Discussed habits of procrastinating, getting self-absorbed with phone, back to overrunning his bed time.  Has made one constructive effort, leaving phone upstairs when attempting dog care tasks so he won't self-interrupt repeatedly.  Has not tried having [name] play jazz.  No strong effort to spend time together.  No further incidents with cross-dressing nor reports of knife buying.  Supportively confronted [name] analyzing [name] in a way that will only feel shaming and backfire on her.  Discussed the continual, central need to nail down positive time together and advocated he press through dog care tasks and other distractions to make time tonight to watch a show together [facility]).  Just make the effort to clear time and distractions and show up.  Confirmed he's not anxious it will turn into a sexual performance pressure or a scolding or other things and he denies being anxious about those anyway, though [name] keeps theorizing.  Did caution [name] about changing subjects and theorizing too much, as it will tend to recruit [name] to be anxious about how he's received.  Re. Sleep, assessed melatonin use.  Says he tried 10mg  -- 1/2 of a 20mg  ordered online --  and [name] says she regularly takes more herself.  Cautioned against dosing too highly, unknown side effects and risks, and at least at Antonio [name]'s age and stature, it should not take nearly that much.  Therapeutic modalities: Cognitive Behavioral Therapy, Solution-Oriented/Positive Psychology, Environmental manager, and Faith-sensitive  Mental Status/Observations:  Appearance:   Casual     Behavior:  Appropriate  Motor:  Normal  Speech/Language:   Clear and Coherent  Affect:  Appropriate  Mood:  Less subdued  Thought process:  normal  Thought content:    WNL  Sensory/Perceptual disturbances:    WNL  Orientation:  Fully oriented  Attention:  Good    Concentration:  Fair  Memory:  grossly intact  Insight:    Fair  Judgment:   Good  Impulse Control:  Fair   Risk Assessment: Danger to Self: No Self-injurious Behavior: No Danger to Others: No Physical Aggression / Violence: No Duty to Warn: No Access to Firearms a concern: No  Assessment of progress:  progressing  Diagnosis:   ICD-10-CM   1. 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 Relationship problem between partners  Z63.0     3. 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 Transvestic fetishism  F65.1     5. Inadequate sleep hygiene  Z72.821     6. 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 Obstructive sleep apnea syndrome  G47.33      Plan:  Communication/conflict/relationship Keep trying to get ahead of  temper -- say you feel frustrated rather than just play it out.  Find 5/day to say "I'm frustrated" and check perceptions before reacting to what he believes he hears from [place] Continue general approach trying to be kinder on the first reaction, giving grace first with [place], not conditional on how he feels treated For encouragement's sake, if desired, ask [name] if she can tell when he is trying to keep from popping off, out of respect for her, or otherwise trying to respect  her better Look for opportunities to establish a little quality time with [name], e.g., [employer] show, and try just touching lightly to get used to it again, e.g., hold hands Pull out old favorite music and activities, including asking [name] to play cool jazz, to help with mood and possibly share [place] encouraged to stop sleeping on the floor, return to the bed (if she can do it without feeling like she is selling out her own protest) [name] make priority of asking/getting Antonio [name]'s attention before expecting it -- he is older, long in the habit of self-absorption, and it's good form anyway Continue abstinence from addictive habits of knife-buying and cross-dressing.  Use other ways of self-soothing, preferably approach [name] for touch time and work on trusting her better to approach, in order to recruit normal sexual interest instead Continue joint sessions as able Physiological interventions for pain, fatigue, sleep, and energy regulation Add omega 3 for added antiinflammatory Try melatonin 1-2 hrs before sleep, 3-5mg .  At least by 10:30pm., and consent to go to bed earlier, say, by midnight. Carb curfew before bed, OK to snack earlier Work on sleep readiness by making sure stimulants out of the way well before bedtime.  Stop coffee by 3pm if at all possible. Turn the lights down about 9pm, bring up light as early as possible Go on to bed by midnight, no matter what Get adequate activity/movement -- resume walking or stationary bike if it suits Practice 5-7 big, deep breaths morning and anytime need a refresher Kidney care, including reducing sodas and good fluid by day, hold down late evening fluids Address pain as needed Make sure CPAP is adjusted and maintained correctly Enable earlier bed time by pressing through dog care tasks Dog care Try to work ahead in the day, do a little something when thinking of it Try to knock out dog care in 1-2 sittings, not interrupt repeatedly Switch from  yelling to using a noisemaker for negative attention Other recommendations/advice as may be noted above Continue to utilize previously learned skills ad lib Maintain medication as prescribed and work faithfully with relevant prescriber(s) if any changes are desired or seem indicated Call the clinic on-call service, 988/hotline, 911, or present to [facility] or ER if any life-threatening psychiatric crisis Return in about 1 month (around 06/18/2022). Already scheduled visit in this office 06/15/2022.  Robley Fries, PhD [name], PhD LP Clinical Psychologist, [facility] [address] [place], [place] [address] [phone]

## 2022-06-15 ENCOUNTER — Ambulatory Visit: Payer: Medicare Other | Admitting: Psychiatry

## 2022-06-15 DIAGNOSIS — Z630 Problems in relationship with spouse or partner: Secondary | ICD-10-CM | POA: Diagnosis not present

## 2022-06-15 DIAGNOSIS — G9332 Myalgic encephalomyelitis/chronic fatigue syndrome: Secondary | ICD-10-CM

## 2022-06-15 DIAGNOSIS — F651 Transvestic fetishism: Secondary | ICD-10-CM

## 2022-06-15 DIAGNOSIS — F5104 Psychophysiologic insomnia: Secondary | ICD-10-CM | POA: Diagnosis not present

## 2022-06-15 DIAGNOSIS — G4733 Obstructive sleep apnea (adult) (pediatric): Secondary | ICD-10-CM

## 2022-06-15 DIAGNOSIS — U099 Post covid-19 condition, unspecified: Secondary | ICD-10-CM

## 2022-06-15 DIAGNOSIS — F3341 Major depressive disorder, recurrent, in partial remission: Secondary | ICD-10-CM

## 2022-06-15 DIAGNOSIS — Z72821 Inadequate sleep hygiene: Secondary | ICD-10-CM

## 2022-06-15 NOTE — Progress Notes (Signed)
Psychotherapy Progress Note [facility] [name], PhD LP  Patient ID: Antonio [name] [facility] "Antonio [name]")    MRN: [id number] Therapy format: Individual psychotherapy Date: 06/15/2022      Start: 4:10p     Stop: 5:00p     Time Spent: 50 min Location: In-person   Session narrative (presenting needs, interim history, self-report of stressors and symptoms, applications of prior therapy, status changes, and interventions made in session) Last 2 visits individual report of doing better at taking chances to be more affectionate then Antonio [name]'s complaint it's nothing improved, just more irritable, interpretation he's not right with God as the root cause, and some gentle confrontation of Antonio [name] being invalidating and of Antonio [name] going back to the same life-eroding habits of procrastination, staying up on his phone, etc., while affirming steps taken to cutdown evening distraction and work to togetherness (e.g., watch "[employer]").  Caution also about mega-dosing melatonin.  This month, says he's had some fallback in losing his temper, as Antonio [name] complained of last time.  Sincerely wants to rein it in now.  Cites memorable moments with a leaky electrical box shorting out on a rainy night, and Antonio [name] getting the mower stuck.  Addressed habit of catastrophizing troubles instead of waiting to see how big/bad they are.  Also notes he is a [place] boy by raising and experience, and 4-letter words and snappy remarks were just culturally normal where he grew up.  Encouraged in finding other things to say under duress and using do-overs when he does slip.  Sounds clearer and happier today, actually.  Attributes that to stopping CPAP, actually -- was sleeping through using it but felt dull in the morning.  Had more vivid dreaming off of it.  Often will have stuffy sinuses after using, as well.  Probed whether it is actually clean or harboring mold, says he used to use a [employer] but learned it can damage the  parts.  Could go back, he figures every two weeks.  Reeducated that he could very well be harboring mold in it and should have it inspected by a technician ASAP before using again, even if he changes hoses and masks on schedule.    Has kicked the soda habit in the past year.  Has come to like icewater just fine.  Affirmed and encouraged in cleaner eating and better health habits.  Therapeutic modalities: Cognitive Behavioral Therapy and Solution-Oriented/Positive Psychology  Mental Status/Observations:  Appearance:   Casual     Behavior:  Appropriate  Motor:  Normal  Speech/Language:   Clear and Coherent  Affect:  Appropriate  Mood:  normal  Thought process:  normal  Thought content:    WNL  Sensory/Perceptual disturbances:    WNL  Orientation:  Fully oriented  Attention:  Good    Concentration:  Good  Memory:  WNL  Insight:    Fair  Judgment:   Fair  Impulse Control:  Good   Risk Assessment: Danger to Self: No Self-injurious Behavior: No Danger to Others: No Physical Aggression / Violence: No Duty to Warn: No Access to Firearms a concern: No  Assessment of progress:  progressing  Diagnosis:   ICD-10-CM   1. 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 Relationship problem between partners  Z63.0     3. 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 Inadequate sleep hygiene  Z72.821     5. 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 Obstructive sleep apnea syndrome  G47.33     7. 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  Find 5/day to say "I'm frustrated" and check perceptions before reacting to what he believes he hears from Antonio [name] Continue general approach trying to be kinder on the first reaction, giving grace first with Malad [name], not conditional on how he feels treated For encouragement's sake,  if desired, ask Antonio [name] if she can tell when he is trying to keep from popping off, out of respect for her, or otherwise trying to respect her better Look for opportunities to establish a little quality time with Antonio [name], e.g., [employer] show, and try just touching lightly to get used to it again, e.g., hold hands Pull out old favorite music and activities, including asking [name] to play cool jazz, to help with mood and possibly share Antonio [name] encouraged to stop sleeping on the floor, return to the bed (if she can do it without feeling like she is selling out her own protest) Antonio [name] make priority of asking/getting Antonio [name]'s attention before expecting it -- he is older, long in the habit of self-absorption, and it's good form anyway Continue abstinence from addictive habits of knife-buying and cross-dressing.  Use other ways of self-soothing, preferably approach Antonio [name] for touch time and work on trusting her better to approach, in order to recruit normal sexual interest instead Continue joint sessions as able Physiological interventions for pain, fatigue, sleep, and energy regulation Add omega 3 for added antiinflammatory Try melatonin 1-2 hrs before sleep, 3-5mg .  At least by 10:30pm., and consent to go to bed earlier, say, by midnight. Carb curfew before bed, OK to snack earlier Work on sleep readiness by making sure stimulants out of the way well before bedtime.  Stop coffee by 3pm if at all possible. Turn the lights down about 9pm, bring up light as early as possible Go on to bed by midnight, no matter what Get adequate activity/movement -- resume walking or stationary bike if it suits Practice 5-7 big, deep breaths morning and anytime need a refresher Kidney care, including reducing sodas and good fluid by day, hold down late evening fluids Address pain as needed Make sure CPAP is adjusted and maintained correctly Enable earlier bed time by pressing through dog care tasks Dog care Try to work ahead in  the day, do a little something when thinking of it Try to knock out dog care in 1-2 sittings, not interrupt repeatedly Switch from yelling to using a noisemaker for negative attention Other recommendations/advice as may be noted above Continue to utilize previously learned skills ad lib Maintain medication as prescribed and work faithfully with relevant prescriber(s) if any changes are desired or seem indicated Call the clinic on-call service, 988/hotline, 911, or present to [facility] or ER if any life-threatening psychiatric crisis Return for time as available. Already scheduled visit in this office 07/13/2022.  Robley Fries, PhD [name], PhD LP Clinical Psychologist, [facility] [address], [address] [place], [place] [address] [phone]

## 2022-06-22 NOTE — Progress Notes (Incomplete)
Psychotherapy Progress Note [facility] [name], PhD LP  Patient ID: Antonio [name] [facility] [name])    MRN: [id number] Therapy format: Family therapy w/ patient -- accompanied by [name] Date: 04/06/2022      Start: 4:16p     Stop: 5:04p     Time Spent: 48 min Location: In-person   Session narrative (presenting needs, interim history, self-report of stressors and symptoms, applications of prior therapy, status changes, and interventions made in session) Been more affectionate recently, and it was going well, until [name] relapsed in cross-dressing and got caught wearing [name]'s bra.  Doesn't much know why he did it, just did.  Discussed at length, reiterating interpretations past concerning getting a thrill out of the taboo, old habit of self-pleasure via the fetish, and defending against the threat of intimacy/vulnerability by offending again.  Confirmed he is not presently motivated to dress up again, understands how it would bother [name], especially if his fetish dos not even ready him to be turned on with her.  For her part, [name] is pretty understanding, just wants him to quit it and re-engage her more like in the early days of their relationship.  Therapeutic modalities: {AM:23362::"Cognitive Behavioral Therapy","Solution-Oriented/Positive Psychology"}  Mental Status/Observations:  Appearance:   {PSY:22683}     Behavior:  {PSY:21022743}  Motor:  {PSY:22302}  Speech/Language:   {PSY:22685}  Affect:  {PSY:22687}  Mood:  {PSY:31886}  Thought process:  {PSY:31888}  Thought content:    {PSY:[id number]}  Sensory/Perceptual disturbances:    {PSY:859-755-5600}  Orientation:  {Psych Orientation:23301::"Fully oriented"}  Attention:  {Good-Fair-Poor ratings:23770::"Good"}    Concentration:  {Good-Fair-Poor ratings:23770::"Good"}  Memory:  {PSY:249-296-4446}  Insight:    Fair  Judgment:   Fair  Impulse Control:  Fair   Risk Assessment: Danger to Self:  {Risk:22599::"No"} Self-injurious Behavior: {Risk:22599::"No"} Danger to Others: {Risk:22599::"No"} Physical Aggression / Violence: {Risk:22599::"No"} Duty to Warn: {AMYesNo:22526::"No"} Access to Firearms a concern: {AMYesNo:22526::"No"}  Assessment of progress:  {Progress:22147::"progressing"}  Diagnosis: No diagnosis found. Plan:  Transvestic fetish -- abstain from cross-dressing, use other ways of self-soothing, preferably approach [name] for touch time and work on trusting her better to approach, in order to recruit normal sexual interest instead Communication/conflict/relationship Pull out old favorite music and activities, soon, including asking [name] to play cool jazz Continue general approach trying to be kinder on the first reaction, giving grace first with [place], not conditional on how he feels treated Keep trying to get ahead of temper and say you feel frustrated rather than just play it out.  Find 5/day to say "I'm frustrated" and check perceptions before reacting to what he believes he hears from [place] For encouragement's sake, if desired, ask [name] if she can tell when he is trying to keep from popping off, out of respect for her, or otherwise trying to respect her better Look for opportunities to establish a little quality time with [name], e.g., [employer] show, and try just touching lightly to get used to it again, e.g., hold hands [name] encouraged to stop sleeping on the floor, return to the bed (if she can do it without feeling like she is selling out her own protest) [name] make priority of asking/getting [name]'s attention before expecting it -- he is older, long in the habit of self-absorption, and it's good form anyway Continue abstinence from addictive habits with knife-buying and cross-dressing Continue joint sessions as able Physiological interventions for pain, fatigue, sleep, and depression Add omega 3 for added antiinflammatory Try melatonin 1-2 hrs before sleep,  3-5mg .   At least by 10:30pm., and consent to go to bed earlier. Carb curfew before bed, OK to snack earlier Sleep, brain fog, and energy regulation Work on sleep readiness by making sure stimulants out of the way well before bedtime.  Stop coffee by 3pm if at all possible. Turn the lights down about 9pm, bring up light as early as possible Go on to bed by midnight, no matter what Get adequate activity/movement -- resume walking or stationary bike if it suits Practice 5-7 big, deep breaths morning and anytime need a refresher Kidney care, including reducing sodas and good fluid by day, hold down late evening fluids Address pain as needed Make sure CPAP is adjusted and maintained correctly Enable earlier bed time by pressing through dog care tasks Dog care Try to work ahead in the day, do a little something when thinking of it Switch from yelling to using a noisemaker for negative attention Other recommendations/advice as may be noted above Continue to utilize previously learned skills ad lib Maintain medication as prescribed and work faithfully with relevant prescriber(s) if any changes are desired or seem indicated Call the clinic on-call service, 988/hotline, 911, or present to [facility] or ER if any life-threatening psychiatric crisis No follow-ups on file. Already scheduled visit in this office 04/07/2022.  Robley Fries, PhD [name], PhD LP Clinical Psychologist, [facility] [address], [address] [place], [place] [address] [phone]

## 2022-07-13 ENCOUNTER — Encounter: Payer: Self-pay | Admitting: Physician Assistant

## 2022-07-13 ENCOUNTER — Ambulatory Visit: Payer: Medicare Other | Admitting: Physician Assistant

## 2022-07-13 DIAGNOSIS — Z72821 Inadequate sleep hygiene: Secondary | ICD-10-CM | POA: Diagnosis not present

## 2022-07-13 DIAGNOSIS — G4733 Obstructive sleep apnea (adult) (pediatric): Secondary | ICD-10-CM

## 2022-07-13 DIAGNOSIS — F3341 Major depressive disorder, recurrent, in partial remission: Secondary | ICD-10-CM

## 2022-07-13 NOTE — Progress Notes (Signed)
[facility]  Patient ID: Antonio [name],  MRN: [id number]  PCP: Lorenda Ishihara, MD  Date of Evaluation: 07/13/2022 Time spent:20 minutes  Chief Complaint:  Chief Complaint   Follow-up    HISTORY/CURRENT STATUS: For routine med check.   Antonio [name] states he is doing pretty well.  His mood is good most of the time.  No symptoms of depression.  Energy and motivation are still low.  His PCP is aware.  He does take iron.  No extreme sadness, tearfulness, or feelings of hopelessness.  ADLs and personal hygiene are normal.   Denies any changes in concentration, making decisions, or remembering things.  Appetite has not changed.  Weight is stable. No c/o anxiety.  He does not sleep well but mostly because he stays up too late reading or watching TV or something.  Then he sleeps late.  Denies suicidal or homicidal thoughts.  Patient denies increased energy with decreased need for sleep, increased talkativeness, racing thoughts, impulsivity or risky behaviors, increased spending, increased libido, grandiosity, increased irritability or anger, paranoia, or hallucinations.  Denies dizziness, syncope, seizures, numbness, tingling, tremor, tics, unsteady gait, slurred speech, confusion. Denies muscle or joint pain, stiffness, or dystonia.  Individual Medical History/ Review of Systems: Changes? :No     Past medications for mental health diagnoses include: Wellbutrin SR Wellbutrin XL, Zoloft caused falls, Cymbalta, naltrexone for post-COVID fog.  Allergies: Metformin and related, Wellbutrin [bupropion], and Zoloft [sertraline]  Current Medications:  Current Outpatient Medications:    acetaminophen (TYLENOL) 500 MG tablet, , Disp: , Rfl:    amLODipine (NORVASC) 5 MG tablet, Take 1 tablet by mouth daily., Disp: , Rfl:    b complex vitamins capsule, Take 1 capsule by mouth daily., Disp: , Rfl:    cholecalciferol (VITAMIN D) 1000 units tablet, Take 5,000 Units by mouth in the  morning and at bedtime., Disp: , Rfl:    ELDERBERRY PO, Take 2 each by mouth daily. Gummies, Disp: , Rfl:    famotidine (PEPCID) 10 MG tablet, Take 10 mg by mouth daily. , Disp: , Rfl:    ferrous sulfate 325 (65 FE) MG tablet, Take 325 mg by mouth daily with breakfast., Disp: , Rfl:    JARDIANCE 10 MG TABS tablet, Take 10 mg by mouth daily., Disp: , Rfl:    lamoTRIgine (LAMICTAL) 100 MG tablet, Take 1 tablet (100 mg total) by mouth daily., Disp: 90 tablet, Rfl: 1   potassium chloride SA (K-DUR,KLOR-CON) 20 MEQ tablet, Take 20 mEq by mouth daily. , Disp: , Rfl: 3   sildenafil (REVATIO) 20 MG tablet, , Disp: , Rfl:    Turmeric Curcumin 500 MG CAPS, , Disp: , Rfl:    UNABLE TO FIND, CPAP, Disp: , Rfl:    atorvastatin (LIPITOR) 10 MG tablet, , Disp: , Rfl:    empagliflozin (JARDIANCE) 25 MG TABS tablet, Take 1 tablet (25 mg total) by mouth daily before breakfast. (Patient not taking: Reported on 06/30/2020), Disp: 30 tablet, Rfl: 11   Tamsulosin HCl (FLOMAX) 0.4 MG CAPS, Take 1 capsule (0.4 mg total) by mouth daily after breakfast. (Patient not taking: Reported on 06/30/2020), Disp: 30 capsule, Rfl: 0   traMADol (ULTRAM) 50 MG tablet, Take 1-2 tablets (50-100 mg total) by mouth every 6 (six) hours as needed for moderate pain or severe pain. (Patient not taking: Reported on 06/30/2020), Disp: 20 tablet, Rfl: 0 Medication Side Effects: none  Family Medical/ Social History: Changes? no  MENTAL HEALTH EXAM:  There were no  vitals taken for this visit.There is no height or weight on file to calculate BMI.  General Appearance: Casual and Well Groomed  Eye Contact:  Good  Speech:  Clear and Coherent and Normal Rate  Volume:  Normal  Mood:  Euthymic  Affect:  Congruent  Thought Process:  Goal Directed and Descriptions of Associations: Circumstantial  Orientation:  Full (Time, Place, and Person)  Thought Content: Logical   Suicidal Thoughts:  No  Homicidal Thoughts:  No  Memory:  Immediate;    Fair Remote;   Good  Judgement:  Good  Insight:  Good  Psychomotor Activity:  Normal  Concentration:  Concentration: Good and Attention Span: Good  Recall:  Good  Fund of Knowledge: Good  Language: Good  Assets:  Communication Skills Desire for Improvement Financial Resources/Insurance Housing Transportation  ADL's:  Intact  Cognition: WNL  Prognosis:  Good   DIAGNOSES:    ICD-10-CM   1. 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 Inadequate sleep hygiene  Z72.821     3. Obstructive sleep apnea syndrome  G47.33       Receiving Psychotherapy: Yes   Dr. Mardelle Matte [place]  RECOMMENDATIONS:  PDMP was reviewed.  No controlled substances.  I provided 17 minutes of face to face time during this encounter, including time spent before and after the visit in records review, medical decision making, counseling pertinent to today's visit, and charting.   Overall he is doing well with his mood.  No changes in treatment are necessary.  Continue Lamictal 100 mg, 1 p.o. nightly. Continue vitamins as listed on med list. Continue CPAP use. Continue therapy with Dr. [name]. Return in 4 months.    Melony Overly, PA-C

## 2022-07-19 ENCOUNTER — Ambulatory Visit (INDEPENDENT_AMBULATORY_CARE_PROVIDER_SITE_OTHER): Payer: Medicare Other | Admitting: Psychiatry

## 2022-08-16 ENCOUNTER — Ambulatory Visit: Payer: Medicare Other | Admitting: Psychiatry

## 2022-08-16 DIAGNOSIS — Z72821 Inadequate sleep hygiene: Secondary | ICD-10-CM

## 2022-08-16 DIAGNOSIS — G4733 Obstructive sleep apnea (adult) (pediatric): Secondary | ICD-10-CM | POA: Diagnosis not present

## 2022-08-16 DIAGNOSIS — Z630 Problems in relationship with spouse or partner: Secondary | ICD-10-CM | POA: Diagnosis not present

## 2022-08-16 DIAGNOSIS — F5104 Psychophysiologic insomnia: Secondary | ICD-10-CM

## 2022-08-16 DIAGNOSIS — F3341 Major depressive disorder, recurrent, in partial remission: Secondary | ICD-10-CM

## 2022-08-16 DIAGNOSIS — Z789 Other specified health status: Secondary | ICD-10-CM

## 2022-08-16 NOTE — Progress Notes (Addendum)
Psychotherapy Progress Note [facility] [name], PhD LP  Patient ID: Antonio [name] [facility] "Antonio [name]")    MRN: [id number] Therapy format: Individual psychotherapy Date: 08/16/2022      Start: 11:20a     Stop: 12:10a     Time Spent: 50 min Location: In-person   Session narrative (presenting needs, interim history, self-report of stressors and symptoms, applications of prior therapy, status changes, and interventions made in session) "Pretty good" at home with [place].  Instead of settling down to enjoy some TV together, says [name] is typically working away the evenings, up to 3am, reenacting an old pattern up jumping in "with all feet" to responsibilities, only sleeping 4 hrs/night, and compulsively taking in more stray dogs.  Got down to 11 dogs, which was "like a vacation", but got 5 more in.  [name] overcooks and creates leftovers for days, which is OK.  Enjoying a church dinner or dinner out on Weds, dinner with her sisters on Friday, sometimes Saturday.  A/C was out for 2 weeks, getting by on 2 window units to keep low 80s temps inside.   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    New problem with older son Gerlene Burdock and daughter Judeth Cornfield not getting along with the younger son Lorin Picket, owing to [place] not letting his wife [place] participate with Richard's ex-wife Lupita Leash in a family event.  Turns out there's a longer pattern of people feeling blown off by Lorin Picket, who typically runs very late and doesn't give notice, and Judeth Cornfield, for one, has felt too disrespected.  Also sounds like there is a widespread family trait of stubbornness, not giving each other the satisfaction.  Working on getting a family meeting -- at least a partial one -- to retire some grievances.  Affirmed and encouraged.  Sleeping OK, but actually finds his head is not stuffy when he leaves off CPAP.  Recommend again to have the machine checked out for unnoticed mold growth, and if  ned be, recheck ned for CPAP.  Evening routine still involves a lot of procrastinating dog feedings.  Temper improving some, he says.  Still off sodas pretty well.  Affirmed and encouraged.  Therapeutic modalities: Cognitive Behavioral Therapy, Solution-Oriented/Positive Psychology, and Ego-Supportive  Mental Status/Observations:  Appearance:   Casual     Behavior:  Appropriate  Motor:  Normal  Speech/Language:   Clear and Coherent  Affect:  Appropriate  Mood:  better  Thought process:  normal  Thought content:    WNL  Sensory/Perceptual disturbances:    WNL  Orientation:  Fully oriented  Attention:  Good    Concentration:  Fair  Memory:  WNL  Insight:    Good  Judgment:   Good  Impulse Control:  Good   Risk Assessment: Danger to Self: No Self-injurious Behavior: No Danger to Others: No Physical Aggression / Violence: No Duty to Warn: No Access to Firearms a concern: No  Assessment of progress:  progressing  Diagnosis:   ICD-10-CM   1. 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 Inadequate sleep hygiene  Z72.821     4. Obstructive sleep apnea syndrome  G47.33    r/o poorly maintained CPAP    5. Relationship problem between partners  Z63.0     6. Caffeine use, r/o disorder  Z78.9    much improved     Plan:  Communication/conflict/relationship  Keep trying to get ahead of temper -- say you feel frustrated rather than just play it out.  Find 5/day to say "I'm frustrated" and check perceptions before reacting to what he believes he hears from [place] Continue general approach trying to be kinder on the first reaction, giving grace first with [place], not conditional on how he feels treated For encouragement's sake, if desired, ask [name] if she can tell when he is trying to keep from popping off, out of respect for her, or otherwise trying to respect her better Look for opportunities to establish a little  quality time with [name], e.g., [employer] show, and try just touching lightly to get used to it again, e.g., hold hands Pull out old favorite music and activities, including asking [name] to play cool jazz, to help with mood and possibly share [place] make priority of asking/getting [name]'s attention before expecting it -- he is older, long in the habit of self-absorption, and it's good form anyway Continue abstinence from addictive habits of knife-buying and cross-dressing.  Use other ways of self-soothing, preferably approach [name] for touch time and work on trusting her better to approach, in order to recruit normal sexual interest instead [name] encouraged to stop sleeping on the floor, return to the bed (if she can do it without feeling like she is selling out her own protest).  Will encourage also to improve her own sleep habit from 4 hr/night. Continue joint sessions as able and interested Physiological interventions for pain, fatigue, sleep, and energy regulation Add omega 3 for added antiinflammatory Try melatonin 1-2 hrs before sleep, 3-5mg .  At least by 10:30pm., and consent to go to bed earlier, say, by midnight. Carb curfew before bed, OK to snack earlier Work on sleep readiness by making sure stimulants out of the way well before bedtime.  Stop coffee by 3pm if at all possible.  Continue overall soda reduction Turn the lights down about 9pm, bring up light as early as possible Go on to bed by midnight, no matter what Get adequate activity/movement -- resume walking or stationary bike if it suits Practice 5-7 big, deep breaths morning and anytime need a refresher Kidney care, including reducing sodas and good fluid by day, hold down late evening fluids Address pain as needed Make sure CPAP is adjusted, clean, and maintained correctly.  Recheck need if desired. Enable earlier bed time by pressing through dog care tasks Dog care Try to work ahead in the day, do a little something when  thinking of it Try to knock out dog care in 1-2 sittings, not interrupt repeatedly Switch from yelling to using a noisemaker for negative attention Other recommendations/advice -- As may be noted above.  Continue to utilize previously learned skills ad lib. Medication compliance -- Maintain medication as prescribed and work faithfully with relevant prescriber(s) if any changes are desired or seem indicated. Crisis service -- Aware of call list and work-in appts.  Call the clinic on-call service, 988/hotline, 911, or present to [facility] or ER if any life-threatening psychiatric crisis. Followup -- Return for time as already scheduled.  Next scheduled visit with me 09/14/2022.  Next scheduled in this office 09/14/2022.  Robley Fries, PhD [name], PhD LP Clinical Psychologist, [facility] [address] [place], [place] [address] [phone]

## 2022-09-14 ENCOUNTER — Ambulatory Visit: Payer: Medicare Other | Admitting: Psychiatry

## 2022-10-06 ENCOUNTER — Other Ambulatory Visit: Payer: Self-pay | Admitting: Physician Assistant

## 2022-10-12 ENCOUNTER — Ambulatory Visit: Payer: Medicare Other | Admitting: Psychiatry

## 2022-10-12 DIAGNOSIS — Z72821 Inadequate sleep hygiene: Secondary | ICD-10-CM | POA: Diagnosis not present

## 2022-10-12 DIAGNOSIS — F3341 Major depressive disorder, recurrent, in partial remission: Secondary | ICD-10-CM

## 2022-10-12 DIAGNOSIS — F5104 Psychophysiologic insomnia: Secondary | ICD-10-CM

## 2022-10-12 DIAGNOSIS — Z630 Problems in relationship with spouse or partner: Secondary | ICD-10-CM

## 2022-10-12 DIAGNOSIS — G4733 Obstructive sleep apnea (adult) (pediatric): Secondary | ICD-10-CM | POA: Diagnosis not present

## 2022-10-12 NOTE — Progress Notes (Signed)
Psychotherapy Progress Note [facility] [name], PhD LP  Patient ID: Antonio [name] [facility] "[name]    MRN: [id number] Therapy format: Family therapy w/ patient -- accompanied by [name] Date: 10/12/2022      Start: 1:06p     Stop: 1:56p     Time Spent: 55 min Location: In-person   Session narrative (presenting needs, interim history, self-report of stressors and symptoms, applications of prior therapy, status changes, and interventions made in session) Says [name] wanted to come with him today, obviously to lodge a complaint about him.  Received warmly and redirected to provide Pt the opportunity to volunteer the issue, and he admits getting irritable again.  [name] relates how he gets so stubborn, won't listen to things even when it's near-obvious he's wrong, naysays things that work until she shows him.  Back to short sleep at night, th relief of taking off CPAP was short-lived.  Reiterated he needs to confirm he still needs it and gt it checked out or mold, because the congestion he describes is still a strong indication it is contaminated.    [name] wants to know if he can be tested for Adult Asperger's.  Suggested not, that even though he has some features that will bring up the idea (she mentions no friends, seemingly no feelings, little insight into why he does things, fascinations with objects like knives and flashlights).  Interpreted that his upbringing, with the perception of Mom withholding affection, seeing little PDA between parents, being a short fellow from working class neighborhood among middle and upper class kids in [place], all led him to feel isolated and rejectable, so he had a low self-image and identified with other rejected kids and went for minor hellraising.  At home, he took to trying on mother's clothing as a way to feel close to her by proxy, to fill the void of love he felt missing, and then it got layered with the thrill of the taboo and th shame of  a secret.  Collecting became a safe interest while people were risky, and his first marriage failed partly for mate selection and partly for being unable to be truly vulnerable, and in this marriage all of the above play out again.  Thankfully [name] has been more steadfast than that, and realizes he is not trying to be a pervert, he's trying to fill the void the ways he's used to.  It's chronic depression dating to childhood, not autistic spectrum, even thought it matches Freud's idea of autism.    Has also found cross dressing relapsed, and now found her underwear with a sanitary napkin affixed.  Also saw on security camera that he was wearing her bra, stuffed.  Validated her frustration and reaffirmed the theory of self-comforting by feeling essentially held, by proxy, by the woman in his life.  Relates that he was caught once as an adolescent, taken to a psychiatrist, never asked any questions nor told anything, which seemed both to let him off and make it more taboo.  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  About clothing, is clear he's always liked it tight, suggesting it's about contact comfort, substitute for mother holding him.  Analysis made a lot of sense to both, notably Pt.    Encouraged work primarily on subduing his irritable replies, let [name] lay out a point, and redouble efforts to turn in at a reasonable hour, and if he needs to understand his urge to cross dress,  go back to interpretation, and still get CPAP checked out.  Therapeutic modalities: Cognitive Behavioral Therapy, Solution-Oriented/Positive Psychology, and Insight-Oriented  Mental Status/Observations:  Appearance:   Casual     Behavior:  Appropriate  Motor:  Normal  Speech/Language:   Clear and Coherent  Affect:  Appropriate  Mood:  subdued  Thought process:  concrete  Thought content:    WNL  Sensory/Perceptual disturbances:    WNL  Orientation:  Fully oriented  Attention:  Good    Concentration:   Fair  Memory:  grossly intact  Insight:    Fair  Judgment:   Good  Impulse Control:  Fair   Risk Assessment: Danger to Self: No Self-injurious Behavior: No Danger to Others: No Physical Aggression / Violence: No Duty to Warn: No Access to Firearms a concern: No  Assessment of progress:  stabilized  Diagnosis:   ICD-10-CM   1. 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 Inadequate sleep hygiene  Z72.821     4. Obstructive sleep apnea syndrome  G47.33     5. Relationship problem between partners  Z63.0      Plan:  Communication/conflict/relationship Keep trying to get ahead of temper -- say you feel frustrated rather than just play it out.  Find 5/day to say "I'm frustrated" and check perceptions before reacting to what he believes he hears from [place] Continue general approach trying to be kinder on the first reaction, giving grace first with [place], not conditional on how he feels treated For encouragement's sake, if desired, ask [name] if she can tell when he is trying to keep from popping off, out of respect for her, or otherwise trying to respect her better Look for opportunities to establish a little quality time with [name], e.g., [employer] show, and try just touching lightly to get used to it again, e.g., hold hands Pull out old favorite music and activities, including asking [name] to play cool jazz, to help with mood and possibly share [place] make priority of asking/getting [name]'s attention before expecting it -- he is older, long in the habit of self-absorption, and it's good form anyway Continue abstinence from addictive habits of knife-buying and cross-dressing.  Use other ways of self-soothing, preferably approach [name] for touch time and work on trusting her better to approach, in order to recruit normal sexual interest instead. Self-remind that cross-dressing is an old attempt to feel closer when couldn't;  take the urge as a cue to get closer in reality now [place] encouraged to stop sleeping on the floor, return to the bed (if she can do it without feeling like she is selling out her own protest).  Will encourage also to improve her own sleep habit from 4 hr/night. Continue joint sessions as able and interested Physiological interventions for pain, fatigue, sleep, and energy regulation Add omega 3 for added antiinflammatory Try melatonin 1-2 hrs before sleep, 3-5mg .  At least by 10:30pm., and consent to go to bed earlier, say, by midnight. Carb curfew before bed, OK to snack earlier Work on sleep readiness by making sure stimulants out of the way well before bedtime.  Stop coffee by 3pm if at all possible.  Continue overall soda reduction Turn the lights down about 9pm, bring up light as early as possible Go on to bed by midnight, no matter what Get adequate activity/movement -- resume walking or stationary bike if it suits Practice 5-7 big, deep  breaths morning and anytime need a refresher Kidney care, including reducing sodas and good fluid by day, hold down late evening fluids Address pain as needed Make sure CPAP is adjusted, clean, and maintained correctly.  Recheck need if desired. Enable earlier bed time by pressing through dog care tasks Dog care Try to work ahead in the day, do a little something when thinking of it Try to knock out dog care in 1-2 sittings, not interrupt repeatedly Switch from yelling to using a no Other recommendations/advice -- As may be noted above.  Continue to utilize previously learned skills ad lib. Medication compliance -- Maintain medication as prescribed and work faithfully with relevant prescriber(s) if any changes are desired or seem indicated. Crisis service -- Aware of call list and work-in appts.  Call the clinic on-call service, 988/hotline, 911, or present to [facility] or ER if any life-threatening psychiatric crisis. Followup -- Return for time as already  scheduled, may need to alter schedule.  Next scheduled visit with me 11/23/2022.  Next scheduled in this office 11/13/2022.  Robley Fries, PhD [name], PhD LP Clinical Psychologist, [facility] [address], [address] [place], [place] [address] [phone]

## 2022-10-20 NOTE — Progress Notes (Incomplete)
Psychotherapy Progress Note [facility] [name], PhD LP  Patient ID: Antonio [name] [facility] "Antonio [name]    MRN: [id number] Therapy format: Family therapy w/ patient -- accompanied by [name] Date: 10/12/2022      Start: 1:06p     Stop: 1:56p     Time Spent: 55 min Location: In-person   Session narrative (presenting needs, interim history, self-report of stressors and symptoms, applications of prior therapy, status changes, and interventions made in session) Says Antonio [name] wanted to come with him today, obviously to lodge a complaint about him.  Received warmly and redirected to provide Pt the opportunity to volunteer the issue, and he admits getting irritable again.  Antonio [name] relates how h gets so stubborn, won't listen to things even when it's near-obvious he's wrong, naysays things that work until she shows him.  Back to short sleep at night.  Antonio [name] wants to know if he can be tested for Adult Asperger's.  Suggested not, that even though he has some features that will bring up the idea (she mentions no friends, seemingly no feelings, little insight into why he does things, fascinations with objects like knives and flashlights).  Interpreted that his upbringing, with the perception of Mom withholding affection, seeing little PDA between parents, being a short fellow from working class neighborhood among middle and upper class kids in [place], all led him to feel isolated and rejectable, so he had a low self-image and identified with other rejected kids and went for minor hellraising.  At home, he took to trying on mother's clothing as a way to feel close to her by proxy, to fill the void of love he felt missing, and then it got layered with the thrill of the taboo and th shame of a secret.  Collecting became a safe interest while people were risky, and his first marriage failed partly for mate selection and partly for being unable to be truly vulnerable, and in this marruage all of the baove  play out again.  Thankfully Antonio [name] has been more steadfast than that, and realizes he is not trying to be a pervert, he's trying to fill the void the ways he's used to.  It's chronic depression dating to childhood, not autistic spectrum, even thought it matches Freud's idea of autism.    Has also found crossdressing relapsed, and now found her underwear with a sanitary napkin affixed.  Also saw on security camera that he was wearing her bra, stufffed.  Validated her frustration and reaffirmed the theory of self-comforting by feeling essentially held, by proxy, by the woman in his life.  Relates that he was caught once as an adolescent, taken to a psychiatrist, never asked any questions nor told anything, which seemed both to let him off and make it more taboo.  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  About clothing, is clear he's always liked it tight, suggesting it's about contact comfrot, substitute for mother holding him.  Analysis made a lot of sense to both, notably Pt.    Encouraged work primarily on subduing his irritable replies, let Antonio [name] lay out a point, and redouble efforts to turn in at a reasonable hour, andif h needs to understand his behavior, go back  Therapeutic modalities: {AM:23362::"Cognitive Behavioral Therapy","Solution-Oriented/Positive Psychology"}  Mental Status/Observations:  Appearance:   {PSY:22683}     Behavior:  {PSY:21022743}  Motor:  {PSY:22302}  Speech/Language:   {PSY:22685}  Affect:  {PSY:22687}  Mood:  {PSY:31886}  Thought process:  {  MVH:84696}  Thought content:    {PSY:506-043-1174}  Sensory/Perceptual disturbances:    {PSY:9375382885}  Orientation:  {Psych Orientation:23301::"Fully oriented"}  Attention:  {Good-Fair-Poor ratings:23770::"Good"}    Concentration:  {Good-Fair-Poor ratings:23770::"Good"}  Memory:  {PSY:520-678-2141}  Insight:    {Good-Fair-Poor ratings:23770::"Good"}  Judgment:   {Good-Fair-Poor ratings:23770::"Good"}  Impulse  Control:  {Good-Fair-Poor ratings:23770::"Good"}   Risk Assessment: Danger to Self: {Risk:22599::"No"} Self-injurious Behavior: {Risk:22599::"No"} Danger to Others: {Risk:22599::"No"} Physical Aggression / Violence: {Risk:22599::"No"} Duty to Warn: {AMYesNo:22526::"No"} Access to Firearms a concern: {AMYesNo:22526::"No"}  Assessment of progress:  {Progress:22147::"progressing"}  Diagnosis: No diagnosis found. Plan:  *** Other recommendations/advice -- As may be noted above.  Continue to utilize previously learned skills ad lib. Medication compliance -- Maintain medication as prescribed and work faithfully with relevant prescriber(s) if any changes are desired or seem indicated. Crisis service -- Aware of call list and work-in appts.  Call the clinic on-call service, 988/hotline, 911, or present to [facility] or ER if any life-threatening psychiatric crisis. Followup -- No follow-ups on file.  Next scheduled visit with me 11/23/2022.  Next scheduled in this office 11/13/2022.  Robley Fries, PhD [name], PhD LP Clinical Psychologist, [facility] [address] [place], [place] [address] [phone]

## 2022-11-13 ENCOUNTER — Ambulatory Visit (INDEPENDENT_AMBULATORY_CARE_PROVIDER_SITE_OTHER): Payer: Medicare Other | Admitting: Physician Assistant

## 2022-11-16 ENCOUNTER — Ambulatory Visit: Payer: Medicare Other | Admitting: Psychiatry

## 2022-11-23 ENCOUNTER — Ambulatory Visit: Payer: Medicare Other | Admitting: Psychiatry

## 2022-12-07 ENCOUNTER — Ambulatory Visit: Payer: Medicare Other | Admitting: Psychiatry

## 2022-12-07 DIAGNOSIS — Z630 Problems in relationship with spouse or partner: Secondary | ICD-10-CM

## 2022-12-07 DIAGNOSIS — G4733 Obstructive sleep apnea (adult) (pediatric): Secondary | ICD-10-CM | POA: Diagnosis not present

## 2022-12-07 DIAGNOSIS — Z72821 Inadequate sleep hygiene: Secondary | ICD-10-CM

## 2022-12-07 DIAGNOSIS — F331 Major depressive disorder, recurrent, moderate: Secondary | ICD-10-CM | POA: Diagnosis not present

## 2022-12-07 DIAGNOSIS — F5104 Psychophysiologic insomnia: Secondary | ICD-10-CM | POA: Diagnosis not present

## 2022-12-07 NOTE — Progress Notes (Signed)
 Psychotherapy Progress Note Antonio [name], P.A. Antonio Czar, PhD LP  Patient ID: Antonio [name] Antonio [name] Antonio [name] "Antonio [name]    MRN: [id number] Therapy format: Family therapy w/ patient -- accompanied by Antonio [name] Date: 12/07/2022      Start: 2:05p     Stop: 2:55p     Time Spent: 50 min Location: In-person   Session narrative (presenting needs, interim history, self-report of stressors and symptoms, applications of prior therapy, status changes, and interventions made in session) Antonio [name] is post surgery for left foot and having sharp pains now.  Working on Advice worker.  Condolences offered.    Relationship warmer since she took a long trip with friends.  Been working through [employer] in the house.  Had a long-put off family meeting Antonio [name], and his 3 kids, where the agenda was going to be confronting Antonio [name] about a sense of entitlement and stringing family along in making plans.  Nobody got that out with him, though.  Heard out Antonio [name]'s puzzling grievance about not being made over after coming to church.  Antonio [name] got it out that she has felt like she's not been well accepted into the family, excepting Antonio [name], the oldest.  Antonio [name] characterized their marriage as a "joke", unfortunately, and some things said triggered Antonio [name] to feel rejected, including Antonio [name] explaining why Antonio [name] would feel that way.  Discussed at some length, disentangled Antonio [name] taking it personally the he didn't "defend" her and heard out the issue more.  Did manage to prompt Antonio [name] to try to call the kids more, as they all seemed to feel he's been too inert and detached with them.    Agreed Antonio [name] has been less irritable of late, attributed to missing Antonio [name] and trying to apply advice, though it is difficult to tell if it is working, or even remembered, often enough.  Discussed some range of related issues, addressing process habits of Antonio [name] listing complaints and Antonio [name] becoming defensive.  Reinforced prior recommendations to  seek quality time, move through dog care more efficiently in the evening, and get to bed earlier.  Reviewed holiday plans.  Therapeutic modalities: Cognitive Behavioral Therapy, Solution-Oriented/Positive Psychology, Ego-Supportive, and Interpersonal  Mental Status/Observations:  Appearance:   Casual     Behavior:  Appropriate  Motor:  Normal  Speech/Language:   Clear and Coherent  Affect:  Appropriate  Mood:  dysthymic  Thought process:  concrete  Thought content:    WNL  Sensory/Perceptual disturbances:    WNL  Orientation:  Fully oriented  Attention:  Good    Concentration:  Fair  Memory:  grossly intact  Insight:    Fair  Judgment:   Good  Impulse Control:  Good   Risk Assessment: Danger to Self: No Self-injurious Behavior: No Danger to Others: No Physical Aggression / Violence: No Duty to Warn: No Access to Firearms a concern: No  Assessment of progress:  progressing  Diagnosis:   ICD-10-CM   1. Major depressive disorder, recurrent episode, moderate (HCC)  F33.1     2. 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 Relationship problem between partners  Z63.0     4. Obstructive sleep apnea syndrome  G47.33     5. Inadequate sleep hygiene  [phone]      Plan:  Communication/conflict/relationship Keep trying to get ahead of temper -- say you feel frustrated rather than just play it out.  Find 5/day to say "I'm frustrated" and check perceptions before reacting to what he believes he hears from Antonio [name]  Continue general approach trying to be kinder on the first reaction, giving grace first with Antonio [name], not conditional on how he feels treated For encouragement's sake, if desired, ask Antonio [name] if she can tell when he is trying to keep from popping off, out of respect for her, or otherwise trying to respect her better Look for opportunities to establish a little quality time with Antonio [name], e.g., [employer] show, and try just touching lightly to get used to it again, e.g., hold  hands Pull out old favorite music and activities, including asking Antonio [name] to play cool jazz, to help with mood and possibly share Antonio [name] make priority of asking/getting Antonio [name]'s attention before expecting it -- he is older, long in the habit of self-absorption, and it's good form anyway Continue abstinence from addictive habits of knife-buying and cross-dressing.  Use other ways of self-soothing, preferably approach Antonio [name] for touch time and work on trusting her better to approach, in order to recruit normal sexual interest instead. Self-remind that cross-dressing is an old attempt to feel closer when couldn't; take the urge as a cue to get closer in reality now Antonio [name] encouraged to stop sleeping on the floor, return to the bed (if she can do it without feeling like she is selling out her own protest).  Will encourage also to improve her own sleep habit from 4 hr/night. Continue joint sessions as able and interested Physiological interventions for pain, fatigue, sleep, and energy regulation Add omega 3 for added antiinflammatory benefit Try melatonin 1-2 hrs before sleep, 3-5mg .  At least by 10:30pm., and consent to go to bed earlier, say, by midnight. Carb curfew before bed, OK to snack earlier Work on sleep readiness by making sure stimulants out of the way well before bedtime.  Stop coffee by 3pm if at all possible.  Continue overall soda reduction Turn the lights down about 9pm, bring up light as early as possible Go on to bed by midnight, no matter what Get adequate activity/movement -- resume walking or stationary bike if it suits Practice 5-7 big, deep breaths morning and anytime need a refresher, on suspicion of underbreathing Kidney care, including reducing sodas and good fluid by day, hold down late evening fluids Address pain as needed Make sure CPAP is adjusted, clean, and maintained correctly.  Recheck need if desired. Enable earlier bed time by pressing through dog care tasks Dog care Try  to work ahead in the day, do a little something when thinking of it, like preload dishes Try to knock out evening dog care in 1-2 sittings, not interrupt repeatedly and stretch it out Switch from yelling to using a clicker or other noisemaker Other recommendations/advice -- As may be noted above.  Continue to utilize previously learned skills ad lib. Medication compliance -- Maintain medication as prescribed and work faithfully with relevant prescriber(s) if any changes are desired or seem indicated. Crisis service -- Aware of call list and work-in appts.  Call the clinic on-call service, 988/hotline, 911, or present to [facility] or ER if any life-threatening psychiatric crisis. Followup -- Return for time as already scheduled, avail earlier @ PT's need.  Next scheduled visit with me 01/11/2023.  Next scheduled in this office 01/11/2023.  Robley Fries, PhD Antonio Czar, PhD LP Clinical Psychologist, [name] Antonio [name][facility] P[facility] [address] [place], [place] [address] [phone]

## 2023-01-04 ENCOUNTER — Ambulatory Visit: Payer: Medicare Other | Admitting: Psychiatry

## 2023-01-11 ENCOUNTER — Ambulatory Visit: Payer: Medicare Other | Admitting: Psychiatry

## 2023-01-11 ENCOUNTER — Other Ambulatory Visit: Payer: Self-pay | Admitting: Physician Assistant

## 2023-01-11 DIAGNOSIS — F331 Major depressive disorder, recurrent, moderate: Secondary | ICD-10-CM | POA: Diagnosis not present

## 2023-01-11 DIAGNOSIS — F5104 Psychophysiologic insomnia: Secondary | ICD-10-CM

## 2023-01-11 DIAGNOSIS — Z72821 Inadequate sleep hygiene: Secondary | ICD-10-CM

## 2023-01-11 DIAGNOSIS — G4733 Obstructive sleep apnea (adult) (pediatric): Secondary | ICD-10-CM

## 2023-01-11 DIAGNOSIS — Z630 Problems in relationship with spouse or partner: Secondary | ICD-10-CM

## 2023-01-11 NOTE — Progress Notes (Signed)
 Psychotherapy Progress Note [facility] [name], PhD LP  Patient ID: Antonio [name] [facility] "Antonio [name]    MRN: [id number] Therapy format: Family therapy w/ patient -- accompanied by Antonio [name] Date: 01/11/2023      Start: 2:19p     Stop: 3:08p     Time Spent: 49 min Location: In-person   Session narrative (presenting needs, interim history, self-report of stressors and symptoms, applications of prior therapy, status changes, and interventions made in session) Antonio [name] complains his memory is getting worse, as well as his irritability.  Antonio [name] is admittedly extra stressed with decorating for the holiday, feels underslept (and is, legitimately, for continuing habit of staying up), and he notes ongoing fatigue, while [name].  Other complications include Antonio [name] going back to surgery for her foot.  Nutrition is probably of concern as well.  Discussed range of issues at some length, reinforcing prior recommendations and urging each of them to practice assertiveness more than reaction with each other, and Antonio [name] in particular to make gifts of pushing through sessions decorating, dog care, and quality time.  Therapeutic modalities: Cognitive Behavioral Therapy, Solution-Oriented/Positive Psychology, Ego-Supportive, and Interpersonal  Mental Status/Observations:  Appearance:   Casual     Behavior:  Appropriate  Motor:  Normal  Speech/Language:   Clear and Coherent  Affect:  Appropriate  Mood:  dysthymic  Thought process:  concrete  Thought content:    WNL  Sensory/Perceptual disturbances:    WNL  Orientation:  Fully oriented  Attention:  Good    Concentration:  Good  Memory:  WNL  Insight:    Fair  Judgment:   Good  Impulse Control:  Good   Risk Assessment: Danger to Self: No Self-injurious Behavior: No Danger to Others: No Physical Aggression / Violence: No Duty to Warn: No Access to Firearms a concern: No  Assessment of progress:   stabilized  Diagnosis:   ICD-10-CM   1. Major depressive disorder, recurrent episode, moderate (HCC)  F33.1     2. 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 Obstructive sleep apnea syndrome  G47.33     4. Inadequate sleep hygiene  Z72.821     5. Relationship problem between partners  Z63.0      Plan:  Communication/conflict/relationship Keep trying to get ahead of temper -- say you feel frustrated rather than just play it out.  Find 5/day to say "I'm frustrated" and check perceptions before reacting to what he believes he hears from Antonio [name] Continue general approach trying to be kinder on the first reaction, giving grace first with Antonio [name], not conditional on how he feels treated For encouragement's sake, if desired, ask Antonio [name] if she can tell when he is trying to keep from popping off, out of respect for her, or otherwise trying to respect her better Look for opportunities to establish a little quality time with Antonio [name], e.g., [employer] show, and try just touching lightly to get used to it again, e.g., hold hands Pull out old favorite music and activities, including asking Antonio [name] to play cool jazz, to help with mood and possibly share Antonio [name] make priority of asking/getting Antonio [name]'s attention before expecting it -- he is older, long in the habit of self-absorption, and it's good form anyway Continue abstinence from addictive habits of knife-buying and cross-dressing.  Use other ways of self-soothing, preferably approach Antonio [name] for touch time and work on trusting her better to approach, in order to recruit normal sexual interest instead. Self-remind that cross-dressing  is an old attempt to feel closer when couldn't; take the urge as a cue to get closer in reality now Antonio [name] encouraged to stop sleeping on the floor, return to the bed (if she can do it without feeling like she is selling out her own protest).  Will encourage also to improve her own sleep habit from 4 hr/night. Continue joint sessions as  able and interested Physiological interventions for pain, fatigue, sleep, and energy regulation Add omega 3 for added antiinflammatory benefit Try melatonin 1-2 hrs before sleep, 3-5mg .  At least by 10:30pm., and consent to go to bed earlier, say, by midnight. Carb curfew before bed, OK to snack earlier Work on sleep readiness by making sure stimulants out of the way well before bedtime.  Stop coffee by 3pm if at all possible.  Continue overall soda reduction Turn the lights down about 9pm, bring up light as early as possible Go on to bed by midnight, no matter what Get adequate activity/movement -- resume walking or stationary bike if it suits Practice 5-7 big, deep breaths morning and anytime need a refresher, on suspicion of underbreathing Kidney care, including reducing sodas and good fluid by day, hold down late evening fluids Address pain as needed Make sure CPAP is adjusted, clean, and maintained correctly.  Recheck need if desired. Enable earlier bed time by pressing through dog care tasks Dog care Try to work ahead in the day, do a little something when thinking of it, like preload dishes Try to knock out evening dog care in 1-2 sittings, not interrupt repeatedly and stretch it out Switch from yelling to using a clicker or other noisemaker Other recommendations/advice -- As may be noted above.  Continue to utilize previously learned skills ad lib. Medication compliance -- Maintain medication as prescribed and work faithfully with relevant prescriber(s) if any changes are desired or seem indicated. Crisis service -- Aware of call list and work-in appts.  Call the clinic on-call service, 988/hotline, 911, or present to [facility] or ER if any life-threatening psychiatric crisis. Followup -- Return for time as already scheduled.  Next scheduled visit with me 02/09/2023.  Next scheduled in this office 02/09/2023.  Antonio Fries, PhD [name], PhD LP Clinical Psychologist, [facility] [address] [place], [place] [address] [phone]

## 2023-01-11 NOTE — Telephone Encounter (Signed)
Please schedule pt an appt. LV 06/20 due back in 4 months.

## 2023-01-12 NOTE — Telephone Encounter (Signed)
LVM 12/20 @ 2:57p for pt to call back and schedule appt

## 2023-01-31 ENCOUNTER — Encounter: Payer: Self-pay | Admitting: Physician Assistant

## 2023-01-31 ENCOUNTER — Ambulatory Visit: Payer: Medicare Other | Admitting: Physician Assistant

## 2023-01-31 DIAGNOSIS — R5381 Other malaise: Secondary | ICD-10-CM

## 2023-01-31 DIAGNOSIS — F331 Major depressive disorder, recurrent, moderate: Secondary | ICD-10-CM

## 2023-01-31 DIAGNOSIS — Z72821 Inadequate sleep hygiene: Secondary | ICD-10-CM

## 2023-01-31 MED ORDER — LAMOTRIGINE 25 MG PO TABS: ORAL_TABLET | ORAL | 0 refills | Status: DC

## 2023-01-31 NOTE — Progress Notes (Signed)
 [facility]  Patient ID: Antonio [name],  MRN: [id number]  PCP: Elliot Charm, MD  Date of Evaluation:01/31/2023 Time spent:20 minutes  Chief Complaint:  Chief Complaint   Depression; Follow-up    HISTORY/CURRENT STATUS: For routine med check.   He feels more depressed.  Tired all the time. No worse than his baseline. Admits to not going to bed until very late that, after midnight sometimes. He knows he should practice better sleep hygiene. Sleeps late, but doesn't feel rested. Doesn't enjoy anything. Still likes to read and watch tv though. ADLs are personal hygiene are nl, but he does the least he can get by with. Appetite is nl and wt stable. He asks to go off Lamictal .  Doesn't feel like it's working, and never has.  In the past, he's felt better when on it though. No anxiety.  No SI/HI.   Patient denies increased energy with decreased need for sleep, increased talkativeness, racing thoughts, impulsivity or risky behaviors, increased spending, increased libido, grandiosity, increased irritability or anger, paranoia, or hallucinations.  Denies dizziness, syncope, seizures, numbness, tingling, tremor, tics, unsteady gait, slurred speech, confusion. Denies muscle or joint pain, stiffness, or dystonia.  Individual Medical History/ Review of Systems: Changes? :No     Past medications for mental health diagnoses include: Wellbutrin  SR Wellbutrin  XL, Zoloft caused falls, Cymbalta , naltrexone  for post-COVID fog.  Allergies: Metformin  and related, Wellbutrin  [bupropion ], and Zoloft [sertraline]  Current Medications:  Current Outpatient Medications:    acetaminophen  (TYLENOL ) 500 MG tablet, , Disp: , Rfl:    amLODipine (NORVASC) 5 MG tablet, Take 1 tablet by mouth daily., Disp: , Rfl:    b complex vitamins capsule, Take 1 capsule by mouth daily., Disp: , Rfl:    cholecalciferol  (VITAMIN D ) 1000 units tablet, Take 5,000 Units by mouth in the morning and at  bedtime., Disp: , Rfl:    ELDERBERRY PO, Take 2 each by mouth daily. Gummies, Disp: , Rfl:    famotidine  (PEPCID ) 10 MG tablet, Take 10 mg by mouth daily. , Disp: , Rfl:    ferrous sulfate  325 (65 FE) MG tablet, Take 325 mg by mouth daily with breakfast., Disp: , Rfl:    JARDIANCE  10 MG TABS tablet, Take 10 mg by mouth daily., Disp: , Rfl:    lamoTRIgine  (LAMICTAL ) 25 MG tablet, 3 po at bedtime for 4 nights, then 2 po at bedtime for 4 nights, then 1 po at bedtime for 4 nights, then stop., Disp: 24 tablet, Rfl: 0   potassium chloride  SA (K-DUR,KLOR-CON ) 20 MEQ tablet, Take 20 mEq by mouth daily. , Disp: , Rfl: 3   sildenafil (REVATIO) 20 MG tablet, , Disp: , Rfl:    Turmeric Curcumin 500 MG CAPS, , Disp: , Rfl:    UNABLE TO FIND, CPAP, Disp: , Rfl:    atorvastatin (LIPITOR) 10 MG tablet, , Disp: , Rfl:    empagliflozin  (JARDIANCE ) 25 MG TABS tablet, Take 1 tablet (25 mg total) by mouth daily before breakfast. (Patient not taking: Reported on 06/30/2020), Disp: 30 tablet, Rfl: 11   Tamsulosin  HCl (FLOMAX ) 0.4 MG CAPS, Take 1 capsule (0.4 mg total) by mouth daily after breakfast. (Patient not taking: Reported on 06/30/2020), Disp: 30 capsule, Rfl: 0   traMADol  (ULTRAM ) 50 MG tablet, Take 1-2 tablets (50-100 mg total) by mouth every 6 (six) hours as needed for moderate pain or severe pain. (Patient not taking: Reported on 01/31/2023), Disp: 20 tablet, Rfl: 0 Medication Side Effects: none  Family  Medical/ Social History: Changes? no  MENTAL HEALTH EXAM:  There were no vitals taken for this visit.There is no height or weight on file to calculate BMI.  General Appearance: Casual and Well Groomed  Eye Contact:  Good  Speech:  Clear and Coherent and Normal Rate  Volume:  Normal  Mood:   sad  Affect:  Congruent  Thought Process:  Goal Directed and Descriptions of Associations: Circumstantial  Orientation:  Full (Time, Place, and Person)  Thought Content: Logical   Suicidal Thoughts:  No  Homicidal  Thoughts:  No  Memory:  Immediate;   Fair Remote;   Good  Judgement:  Good  Insight:  Good  Psychomotor Activity:  Normal  Concentration:  Concentration: Good and Attention Span: Good  Recall:  Good  Fund of Knowledge: Good  Language: Good  Assets:  Communication Skills Desire for Improvement Financial Resources/Insurance Housing Transportation  ADL's:  Intact  Cognition: WNL  Prognosis:  Good   DIAGNOSES:    ICD-10-CM   1. Major depressive disorder, recurrent episode, moderate (HCC)  F33.1     2. Inadequate sleep hygiene  Z72.821     3. Malaise and fatigue  R53.81    R53.83      Receiving Psychotherapy: Yes   Dr. Jodie Mitchum  RECOMMENDATIONS:  PDMP was reviewed. No controlled meds.  I provided 20 minutes of face to face time during this encounter, including time spent before and after the visit in records review, medical decision making, counseling pertinent to today's visit, and charting.   I don't recommend going off Lamictal  but the opposite, I'd recommend increasing the dose. He responded to it initially. He's adamant to stop.  I'll gradually wean him off, but he understands to call if he starts getting more depressed with the taper and we'll go back up to the appropriate dose at that time. He's aware that we'd have to start all over with dosing if he goes off completely and it would take longer to get to a therapeutic level.   Again disc better sleep hygiene. Use CPAP.   Wean off Lamictal  by taking 75 mg at bedtime for 4 nights, 50 mg at bedtime for 4 nights, 25 mg at bedtime for 4 nights, then stop.  Continue vitamins as listed on med list. Continue CPAP use. Continue therapy with Dr. Jodie Kendall. Return in 6 weeks.    Verneita Cooks, PA-C

## 2023-02-09 ENCOUNTER — Ambulatory Visit: Payer: Medicare Other | Admitting: Psychiatry

## 2023-02-09 DIAGNOSIS — Z630 Problems in relationship with spouse or partner: Secondary | ICD-10-CM

## 2023-02-09 DIAGNOSIS — E1165 Type 2 diabetes mellitus with hyperglycemia: Secondary | ICD-10-CM

## 2023-02-09 DIAGNOSIS — F331 Major depressive disorder, recurrent, moderate: Secondary | ICD-10-CM

## 2023-02-09 DIAGNOSIS — F5104 Psychophysiologic insomnia: Secondary | ICD-10-CM

## 2023-02-09 DIAGNOSIS — Z72821 Inadequate sleep hygiene: Secondary | ICD-10-CM

## 2023-02-09 DIAGNOSIS — G4733 Obstructive sleep apnea (adult) (pediatric): Secondary | ICD-10-CM

## 2023-02-09 NOTE — Progress Notes (Signed)
 Psychotherapy Progress Note [facility] [name], PhD LP  Patient ID: Antonio [name] [facility] "[name]    MRN: [id number] Therapy format: Family therapy w/ patient -- accompanied by [name] Date: 02/09/2023      Start: 3:04p     Stop: 4:00p     Time Spent: 56 min Location: In-person   Session narrative (presenting needs, interim history, self-report of stressors and symptoms, applications of prior therapy, status changes, and interventions made in session) Seen last month with notable stresses from holiday expectations, [name]'s foot pain and upcoming surgery, renewed complaints of him not having time for her and staying up late, renewed complaints of irritability on his part, and renewed challenge to carve out some quality time, turn in earlier, and try to give [name] the benefit of the doubt rather than react abruptly with her.    As of today, [name] is out of the boot from surgery, dealing with pain.  Says [name] is losing his verbal filter more, more argumentative about his memory, and it reminds her of her father, who definitely had vascular + Alzheimer's.  Wondering whether he has it, too.  Also worries whether he might turn suicidal, but no indications at all, just her idea that he's a "quitter".  Challenged her to see the differences with her father, and all but guaranteed her he's not suicidal.  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  Discussed extensively the value of neglected meds -- Jardiance and Repatha, particularly -- and confronted unrealistic conclusion-jumping about cost, which [name] has researched, and knows how this year it's MUCH more affordable.  Witnessed the stubborn logic [name] complains about, worked through to an agreement to investigate his meds, accept help engaging the new  benefit, and give it a fair try managing diabetes to help manage mood and energy.  Reiterated discussion of CPAP resistance -- says it makes his sinuses plug up, but it is still unchecked whether he has hidden mold or is possibly running it too dry.  Encouraged again to have to inspected and cleaned if necessary, and consult respiratory therapy if that doesn't improve it.  Meanwhile, charge to [place] still to back off jumping to more drastic conclusions about [name]'s health when sleep, diabetes, age, and personality can easily account for a lot.  Therapeutic modalities: Cognitive Behavioral Therapy, Solution-Oriented/Positive Psychology, Ego-Supportive, Psycho-education/Bibliotherapy, and Interpersonal  Mental Status/Observations:  Appearance:   Casual     Behavior:  Appropriate, some resistance per topic  Motor:  Normal  Speech/Language:   Clear and Coherent  Affect:  Appropriate  Mood:  dysthymic  Thought process:  concrete  Thought content:    WNL  Sensory/Perceptual disturbances:    WNL  Orientation:  Fully oriented  Attention:  Good    Concentration:  Good  Memory:  grossly intact  Insight:    Fair  Judgment:   Good  Impulse Control:  Fair   Risk Assessment: Danger to Self: No Self-injurious Behavior: No Danger to Others: No Physical Aggression / Violence: No Duty to Warn: No Access to Firearms a concern: No  Assessment of progress:  stabilized  Diagnosis:   ICD-10-CM   1. Major depressive disorder, recurrent episode, moderate (HCC)  F33.1     2. Type 2 diabetes mellitus with hyperglycemia, unspecified whether long term insulin use (HCC)  E11.65  3. 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 Obstructive sleep apnea syndrome  G47.33     5. Inadequate sleep hygiene  Z72.821     6. Relationship problem between partners  Z63.0      Plan:  Communication/conflict/relationship Keep trying to get ahead of temper -- say you feel frustrated rather than just play it out.   Find 5/day to say "I'm frustrated" and check perceptions before reacting to what he believes he hears from [place] Continue general approach trying to be kinder on the first reaction, giving grace first with [place], not conditional on how he feels treated For encouragement's sake, if desired, ask [name] if she can tell when he is trying to keep from popping off, out of respect for her, or otherwise trying to respect her better Look for opportunities to establish a little quality time with [name], e.g., [employer] show, and try just touching lightly to get used to it again, e.g., hold hands Pull out old favorite music and activities, including asking [name] to play cool jazz, to help with mood and possibly share [place] make priority of asking/getting [name]'s attention before expecting it -- he is older, long in the habit of self-absorption, and it's good form anyway Continue abstinence from addictive habits of knife-buying and cross-dressing.  Use other ways of self-soothing, preferably approach [name] for touch time and work on trusting her better to approach, in order to recruit normal sexual interest instead. Self-remind that cross-dressing is an old attempt to feel closer when couldn't; take the urge as a cue to get closer in reality now [place] encouraged to stop sleeping on the floor, return to the bed (if she can do it without feeling like she is selling out her own protest).  Will encourage also to improve her own sleep habit from 4 hr/night. Continue joint sessions as able and interested Physiological interventions for pain, fatigue, sleep, and energy regulation Add omega 3 for added antiinflammatory benefit Try melatonin 1-2 hrs before sleep, 3-5mg .  At least by 10:30pm., and consent to go to bed earlier, say, by midnight. Carb curfew before bed, OK to snack earlier Work on sleep readiness by making sure stimulants out of the way well before bedtime.  Stop coffee by 3pm if at all possible.  Continue  overall soda reduction Turn the lights down about 9pm, bring up light as early as possible Go on to bed by midnight, no matter what Get adequate activity/movement -- resume walking or stationary bike if it suits Practice 5-7 big, deep breaths morning and anytime need a refresher, on suspicion of underbreathing Kidney care, including reducing sodas and good fluid by day, hold down late evening fluids Address pain as needed Make sure CPAP is adjusted, clean, and maintained correctly.  Recheck need if desired. Enable earlier bed time by pressing through dog care tasks Dog care Try to work ahead in the day, do a little something when thinking of it, like preload dishes Try to knock out evening dog care in 1-2 sittings, not interrupt repeatedly and stretch it out Switch from yelling to using a clicker or other noisemaker Other recommendations/advice -- As may be noted above.  Continue to utilize previously learned skills ad lib. Medication compliance -- Maintain medication as prescribed and work faithfully with relevant prescriber(s) if any changes are desired or seem indicated. Crisis service -- Aware of call list and work-in appts.  Call the clinic on-call service, 988/hotline, 911, or present to [facility] or ER if any  life-threatening psychiatric crisis. Followup -- Return for time as already scheduled.  Next scheduled visit with me 02/23/2023.  Next scheduled in this office 02/23/2023.  Robley Fries, PhD [name], PhD LP Clinical Psychologist, [facility] [address] [place], [place] [address] [phone]

## 2023-02-23 ENCOUNTER — Ambulatory Visit (INDEPENDENT_AMBULATORY_CARE_PROVIDER_SITE_OTHER): Payer: Medicare Other | Admitting: Psychiatry

## 2023-02-23 DIAGNOSIS — Z630 Problems in relationship with spouse or partner: Secondary | ICD-10-CM | POA: Diagnosis not present

## 2023-02-23 DIAGNOSIS — G4733 Obstructive sleep apnea (adult) (pediatric): Secondary | ICD-10-CM

## 2023-02-23 DIAGNOSIS — Z72821 Inadequate sleep hygiene: Secondary | ICD-10-CM | POA: Diagnosis not present

## 2023-02-23 DIAGNOSIS — E1165 Type 2 diabetes mellitus with hyperglycemia: Secondary | ICD-10-CM

## 2023-02-23 DIAGNOSIS — F331 Major depressive disorder, recurrent, moderate: Secondary | ICD-10-CM

## 2023-02-23 DIAGNOSIS — Z789 Other specified health status: Secondary | ICD-10-CM

## 2023-02-23 NOTE — Progress Notes (Signed)
 Psychotherapy Progress Note [facility] [name], PhD LP  Patient ID: Antonio [name] [facility] "Antonio [name]    MRN: [id number] Therapy format: Family therapy w/ patient -- accompanied by Antonio [name] Date: 02/23/2023      Start: 2:11p     Stop: 3:01p     Time Spent: 50 min Location: In-person   Session narrative (presenting needs, interim history, self-report of stressors and symptoms, applications of prior therapy, status changes, and interventions made in session) F/u for chronic issues of memory, irritability, relationship stress, poor sleep management, and irregular self-care for diabetes.  As of last session directed to (1) fact-find about affording Jardiance and (2) get his CPAP adequately checked for either internal mold or insufficient moisture and try it again.  Recent contact with psychiatry reveals he wants to come off anticonvulsant, hopeful of more clarity and better mood, doesn't think it's doing anything for him, though psychiatry did review with him how it was for irritability, deal made to reduce the dose but resume if he notices breakthrough irritability.  Says they did get Jardiance back on plan, but doubts it makes much difference.  Encouraged continue, and try to follow all provisions of diabetes plan for best, most noticeable results over time.  Feedback that he already seems a bit sharper mentally.  Is back on CPAP 3 weeks, not experiencing stuffiness, thankfully, though some issues with fit.  Working through.  Currently off lamotrigine completely, as well, and says "I feel terrible, but I'm not going back on them [sic]."  Discussed recent visit, advice, agreement made, and reinforced self-monitoring for breakthrough mood issues.  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  Encouraged her to ask if it's personal rather than assume and for him to explain as soon as he can next time he seems to have breakout irritability.    Antonio [name] adds that Antonio [name]'s daughter is demanding things now, like that she should go to his doctor's appointments with him to get the real scoop and guide care.  Antonio [name] is taking it personally as trying to marginalize her, understands her to be insinuating that Antonio [name] is somehow fouling up his care.  Antonio [name] agrees to talk to her, ask her to stand down or come with to therapy, at least, so she can reckon with how things stand here.  Noted BP issue.  Advised to check pressure in situations, especially if dizzy, flushed, or foggy.  It will help guide physician in prescribing, especially if pressures are temporary.   Therapeutic modalities: Cognitive Behavioral Therapy, Solution-Oriented/Positive Psychology, Ego-Supportive, and Interpersonal  Mental Status/Observations:  Appearance:   Casual     Behavior:  Appropriate  Motor:  Normal  Speech/Language:   Clear and Coherent  Affect:  Appropriate  Mood:  dysthymic and better  Thought process:  normal  Thought content:    WNL  Sensory/Perceptual disturbances:    WNL  Orientation:  Fully oriented  Attention:  More alert    Concentration:  Good  Memory:  WNL  Insight:    Good  Judgment:   Good  Impulse Control:  Good   Risk Assessment: Danger to Self: No Self-injurious Behavior: No Danger to Others: No Physical Aggression / Violence: No Duty to Warn: No Access to Firearms a concern:  No  Assessment of progress:  progressing  Diagnosis:   ICD-10-CM   1. Major depressive disorder, recurrent episode, moderate (HCC)  F33.1     2. Obstructive sleep apnea syndrome  G47.33     3. Inadequate sleep hygiene  Z72.821     4. Relationship problem between partners  Z63.0     5. Caffeine use, r/o disorder  Z78.9     6. Type 2 diabetes mellitus with hyperglycemia, unspecified whether long term  insulin use (HCC)  E11.65      Plan:  Communication/conflict/relationship Keep trying to get ahead of temper -- say you feel frustrated rather than just play it out.  Find 5/day to say "I'm frustrated" and check perceptions before reacting to what he believes he hears from [place] Continue general approach trying to be kinder on the first reaction, giving grace first with [place], not conditional on how he feels treated For encouragement's sake, if desired, ask Antonio [name] if she can tell when he is trying to keep from popping off, out of respect for her, or otherwise trying to respect her better Look for opportunities to establish a little quality time with Antonio [name], e.g., [employer] show, and try just touching lightly to get used to it again, e.g., hold hands Pull out old favorite music and activities, including asking [name] to play cool jazz, to help with mood and possibly share [place] make priority of asking/getting Antonio [name]'s attention before expecting it -- he is older, long in the habit of self-absorption, and it's good form anyway Continue abstinence from addictive habits of knife-buying and cross-dressing.  Use other ways of self-soothing, preferably approach [name] for touch time and work on trusting her better to approach, in order to recruit normal sexual interest instead. Self-remind that cross-dressing is an old attempt to feel closer when couldn't; take the urge as a cue to get closer in reality now [place] encouraged to stop sleeping on the floor, return to the bed (if she can do it without feeling like she is selling out her own protest).  Will encourage also to improve her own sleep habit from 4 hr/night. Continue joint sessions as able and interested Physiological interventions for pain, fatigue, sleep, energy regulation, and other health issues Add omega 3 for added antiinflammatory benefit Try melatonin 1-2 hrs before sleep, 3-5mg .  At least by 10:30pm., and consent to go to bed earlier, say, by  midnight. Carb curfew before bed, OK to snack earlier Work on sleep readiness by making sure stimulants out of the way well before bedtime.  Stop coffee by 3pm if at all possible.  Continue overall soda reduction Turn the lights down about 9pm, bring up light as early as possible Go on to bed by midnight, no matter what Get adequate activity/movement -- resume walking or stationary bike if it suits Practice 5-7 big, deep breaths morning and anytime need a refresher, on suspicion of underbreathing Kidney care, including reducing sodas and good fluid by day, hold down late evening fluids Address pain as needed Make sure CPAP is adjusted, clean, and maintained correctly.  Recheck need if desired. Enable earlier bed time by pressing through dog care tasks Maintain diabetic regimen Check actual expenses before assuming things are too expensive Check situational BP to help inform physician Dog care Try to work ahead in the day, do a little something when thinking of it, like preload dishes Try to knock out evening dog care in 1-2 sittings, not interrupt repeatedly and stretch it out Switch from  yelling to using a clicker or other noisemaker Other recommendations/advice -- As may be noted above.  Continue to utilize previously learned skills ad lib. Medication compliance -- Maintain medication as prescribed and work faithfully with relevant prescriber(s) if any changes are desired or seem indicated. Crisis service -- Aware of call list and work-in appts.  Call the clinic on-call service, 988/hotline, 911, or present to [facility] or ER if any life-threatening psychiatric crisis. Followup -- Return for time as already scheduled.  Next scheduled visit with me 03/09/2023.  Next scheduled in this office 03/09/2023.  Robley Fries, PhD [name], PhD LP Clinical Psychologist, [facility] [address], [address] [place], [place] [address] [phone]

## 2023-03-09 ENCOUNTER — Ambulatory Visit: Payer: Medicare Other | Admitting: Psychiatry

## 2023-03-09 DIAGNOSIS — E1165 Type 2 diabetes mellitus with hyperglycemia: Secondary | ICD-10-CM

## 2023-03-09 DIAGNOSIS — Z72821 Inadequate sleep hygiene: Secondary | ICD-10-CM | POA: Diagnosis not present

## 2023-03-09 DIAGNOSIS — Z630 Problems in relationship with spouse or partner: Secondary | ICD-10-CM

## 2023-03-09 DIAGNOSIS — G4733 Obstructive sleep apnea (adult) (pediatric): Secondary | ICD-10-CM

## 2023-03-09 DIAGNOSIS — F5104 Psychophysiologic insomnia: Secondary | ICD-10-CM

## 2023-03-09 DIAGNOSIS — F331 Major depressive disorder, recurrent, moderate: Secondary | ICD-10-CM | POA: Diagnosis not present

## 2023-03-09 NOTE — Progress Notes (Signed)
 Psychotherapy Progress Note [facility] 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  Got Claritin and meclizine, dx'd with inner ear fluid and prescribed exercises.  Claims he's been dealing with dizziness for 6-12 months, since a particular donut.  BPs have been running high (when taken in situations of stress, like arguments), but really, it's inconsequential.  Has had tinnitus since 14 also.  Did get 14 hr sleep last night, and successfully getting in bed before 1am the last couple nights.  Encouraged to continue, as better sleep can only help with brain cleaning, recharging neurochemistry, healing functions, and any mitigation of whatever damage may be unknown.  Professes to have been controlling his irritability more, no worse now off lamotrigine.  Validated that he may be more alert, and therefore better able to notice and control it, off the anticonvulsant.  [name] tends to dispute, but working assumption she is chronically disappointed in a number of things with Antonio [name] and may to some extent project her own feelings beyond his behavior.  Renewed encouragement to press through vs procrastinating dog feedings, so he can get to bed earlier and so he can spend some quality time together.  Long habit of putting off, more or less like hitting the snooze.  C/o procrastinated house work as well.  On this particular day, he  is also behind on shopping he meant to do for Valentine's.  Discussed available options yet for honoring the day.    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    Therapeutic modalities: Cognitive Behavioral Therapy, Solution-Oriented/Positive Psychology, Psycho-education/Bibliotherapy, and Interpersonal  Mental Status/Observations:  Appearance:   Casual     Behavior:  Appropriate  Motor:  Wide gait, as usual  Speech/Language:   Clear and Coherent  Affect:  Appropriate  Mood:  normal and anxious/sheepish with subject  Thought process:  normal  Thought content:    WNL  Sensory/Perceptual disturbances:    WNL  Orientation:  Fully oriented  Attention:  Good    Concentration:  Good  Memory:  WNL  Insight:    Fair  Judgment:   Good  Impulse Control:  Good   Risk Assessment: Danger to Self: No Self-injurious Behavior: No Danger to Others: No Physical Aggression / Violence: No Duty to Warn: No Access to Firearms a concern: No  Assessment of progress:  progressing  Diagnosis:   ICD-10-CM   1. Major depressive disorder, recurrent episode, moderate (HCC)  F33.1     2. 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 OSA on CPAP  G47.33     4. Type 2 diabetes mellitus with hyperglycemia, unspecified whether long term insulin use (HCC)  E11.65     5. Inadequate sleep hygiene  Z72.821     6. Relationship  problem between partners  Z63.0      Plan:  Communication/conflict/relationship Keep trying to get ahead of temper -- say you feel frustrated rather than just play it out.  Find 5/day to say "I'm frustrated" and check perceptions before reacting to what he believes he hears from [place] Continue general approach trying to be kinder on the first reaction, giving grace first with [place], not conditional on how he feels treated For encouragement's sake, if desired, ask [name] if she can  tell when he is trying to keep from popping off, out of respect for her, or otherwise trying to respect her better Look for opportunities to establish a little quality time with [name], e.g., [employer] show, and try just touching lightly to get used to it again, e.g., hold hands Pull out old favorite music and activities, including asking [name] to play cool jazz, to help with mood and possibly share [place] make priority of asking/getting [name]'s attention before expecting it -- he is older, long in the habit of self-absorption, and it's good form anyway Continue abstinence from addictive habits of knife-buying and cross-dressing.  Use other ways of self-soothing, preferably approach [name] for touch time and work on trusting her better to approach, in order to recruit normal sexual interest instead. Self-remind that cross-dressing is an old attempt to feel closer when couldn't; take the urge as a cue to get closer in reality now [place] encouraged to stop sleeping on the floor, return to the bed (if she can do it without feeling like she is selling out her own protest).  Will encourage also to improve her own sleep habit from 4 hr/night. Continue joint sessions as able and interested Physiological interventions for pain, fatigue, sleep, energy regulation, and other health issues Add omega 3 for added antiinflammatory benefit Try melatonin 1-2 hrs before sleep, 3-5mg .  At least by 10:30pm., and consent to go to bed earlier, say, by midnight. Carb curfew before bed, OK to snack earlier Work on sleep readiness by making sure stimulants out of the way well before bedtime.  Stop coffee by 3pm if at all possible.  Continue overall soda reduction Turn the lights down about 9pm, bring up light as early as possible Go on to bed by midnight, no matter what Get adequate activity/movement -- resume walking or stationary bike if it suits Practice 5-7 big, deep breaths morning and anytime need a refresher, on suspicion of  underbreathing Kidney care, including reducing sodas and good fluid by day, hold down late evening fluids Address pain as needed Make sure CPAP is adjusted, clean, and maintained correctly.  Recheck need if desired. Enable earlier bed time by pressing through dog care tasks Maintain diabetic regimen Check actual expenses before assuming things are too expensive Check situational BP to help inform physician Dog care Try to work ahead in the day, do a little something when thinking of it, like preload dishes Try to knock out evening dog care in 1-2 sittings, not interrupt repeatedly and stretch it out Switch from yelling to using a clicker or other noisemaker Other recommendations/advice -- As may be noted above.  Continue to utilize previously learned skills ad lib. Medication compliance -- Maintain medication as prescribed and work faithfully with relevant prescriber(s) if any changes are desired or seem indicated. Crisis service -- Aware of call list and work-in appts.  Call the clinic on-call service, 988/hotline, 911, or present to [facility] or ER if any life-threatening psychiatric crisis. Followup -- Return for time as already  scheduled.  Next scheduled visit with me 03/30/2023.  Next scheduled in this office 03/19/2023.  Robley Fries, PhD [name], PhD LP Clinical Psychologist, [facility] [address] [place], [place] [address] [phone]

## 2023-03-19 ENCOUNTER — Encounter: Payer: Self-pay | Admitting: Physician Assistant

## 2023-03-19 ENCOUNTER — Ambulatory Visit: Payer: Medicare Other | Admitting: Physician Assistant

## 2023-03-19 DIAGNOSIS — F3342 Major depressive disorder, recurrent, in full remission: Secondary | ICD-10-CM

## 2023-03-19 DIAGNOSIS — G4733 Obstructive sleep apnea (adult) (pediatric): Secondary | ICD-10-CM

## 2023-03-19 DIAGNOSIS — Z72821 Inadequate sleep hygiene: Secondary | ICD-10-CM | POA: Diagnosis not present

## 2023-03-19 DIAGNOSIS — R413 Other amnesia: Secondary | ICD-10-CM | POA: Diagnosis not present

## 2023-03-19 NOTE — Progress Notes (Signed)
 [facility]  Patient ID: Antonio [name],  MRN: [id number]  PCP: Lorenda Ishihara, MD  Date of Evaluation: 03/19/2023 Time spent:30 minutes  Chief Complaint:  Chief Complaint   Depression; Follow-up    HISTORY/CURRENT STATUS: For routine med check.   At her last visit about 6 weeks ago we weaned off Lamictal at [facility] request.  At that time, he did not feel that the Lamictal was working so he wanted to go off of it even though I recommended increasing the dose.  States he feels much better now and has no symptoms of depression.  He is tired all the time but feels like that is due to his age plus not getting enough sleep.  He chooses to stay up late.  He still enjoys reading.  ADLs and personal hygiene are normal.  Appetite is normal and weight is stable.  No complaints of anxiety.  No suicidal or homicidal thoughts.  His wife [name] has voiced concern during a therapy session with Dr. [name], about dementia because of the fact he forgets things a lot.  He also has a wide gait and gets irritable at times.  [name] feels all that is normal aging.  He forgets what he went into a room for as an example.  He also forgets things that he said he would do, he gets distracted and might remember at the next day.  Patient states he is not forgetting things that could be dangerous such as leaving the oven on or something.  Patient denies increased energy with decreased need for sleep, increased talkativeness, racing thoughts, impulsivity or risky behaviors, increased spending, increased libido, grandiosity, increased irritability or anger, paranoia, or hallucinations.  Review of Systems  Constitutional:  Positive for malaise/fatigue.  HENT: Negative.    Eyes: Negative.   Respiratory: Negative.    Cardiovascular: Negative.   Gastrointestinal: Negative.   Genitourinary: Negative.   Musculoskeletal: Negative.   Skin: Negative.   Neurological: Negative.   Endo/Heme/Allergies:  Negative.   Psychiatric/Behavioral:         See HPI    Individual Medical History/ Review of Systems: Changes? :No     Past medications for mental health diagnoses include: Wellbutrin SR Wellbutrin XL, Zoloft caused falls, Cymbalta, naltrexone for post-COVID fog.  Allergies: Metformin and related, Wellbutrin [bupropion], and Zoloft [sertraline]  Current Medications:  Current Outpatient Medications:    acetaminophen (TYLENOL) 500 MG tablet, , Disp: , Rfl:    amLODipine (NORVASC) 5 MG tablet, Take 1 tablet by mouth daily., Disp: , Rfl:    b complex vitamins capsule, Take 1 capsule by mouth daily., Disp: , Rfl:    cholecalciferol (VITAMIN D) 1000 units tablet, Take 5,000 Units by mouth in the morning and at bedtime., Disp: , Rfl:    ELDERBERRY PO, Take 2 each by mouth daily. Gummies, Disp: , Rfl:    famotidine (PEPCID) 10 MG tablet, Take 10 mg by mouth daily. , Disp: , Rfl:    ferrous sulfate 325 (65 FE) MG tablet, Take 325 mg by mouth daily with breakfast., Disp: , Rfl:    JARDIANCE 10 MG TABS tablet, Take 10 mg by mouth daily., Disp: , Rfl:    potassium chloride SA (K-DUR,KLOR-CON) 20 MEQ tablet, Take 20 mEq by mouth daily. , Disp: , Rfl: 3   sildenafil (REVATIO) 20 MG tablet, , Disp: , Rfl:    Turmeric Curcumin 500 MG CAPS, , Disp: , Rfl:    UNABLE TO FIND, CPAP, Disp: , Rfl:  atorvastatin (LIPITOR) 10 MG tablet, , Disp: , Rfl:    empagliflozin (JARDIANCE) 25 MG TABS tablet, Take 1 tablet (25 mg total) by mouth daily before breakfast. (Patient not taking: Reported on 06/30/2020), Disp: 30 tablet, Rfl: 11   lamoTRIgine (LAMICTAL) 25 MG tablet, 3 po at bedtime for 4 nights, then 2 po at bedtime for 4 nights, then 1 po at bedtime for 4 nights, then stop. (Patient not taking: Reported on 03/19/2023), Disp: 24 tablet, Rfl: 0   Tamsulosin HCl (FLOMAX) 0.4 MG CAPS, Take 1 capsule (0.4 mg total) by mouth daily after breakfast. (Patient not taking: Reported on 06/30/2020), Disp: 30 capsule, Rfl: 0    traMADol (ULTRAM) 50 MG tablet, Take 1-2 tablets (50-100 mg total) by mouth every 6 (six) hours as needed for moderate pain or severe pain. (Patient not taking: Reported on 06/30/2020), Disp: 20 tablet, Rfl: 0 Medication Side Effects: none  Family Medical/ Social History: Changes? no  MENTAL HEALTH EXAM:  There were no vitals taken for this visit.There is no height or weight on file to calculate BMI.  General Appearance: Casual and Well Groomed  Eye Contact:  Good  Speech:  Clear and Coherent and Normal Rate  Volume:  Normal  Mood:  Euthymic  Affect:  Congruent  Thought Process:  Goal Directed and Descriptions of Associations: Circumstantial  Orientation:  Full (Time, Place, and Person)  Thought Content: Logical   Suicidal Thoughts:  No  Homicidal Thoughts:  No  Memory:  Immediate;   Fair Recent;   Fair Remote;   Good  Judgement:  Good  Insight:  Good  Psychomotor Activity:  Normal  Concentration:  Concentration: Good and Attention Span: Good  Recall:  Good  Fund of Knowledge: Good  Language: Good  Assets:  Communication Skills Desire for Improvement Financial Resources/Insurance Housing Transportation  ADL's:  Intact  Cognition: WNL  Prognosis:  Good   PCP follows labs  Animal naming test 29 in 60 seconds  Mini-Mental    Flowsheet Row Office Visit from 03/19/2023 in [facility] Office Visit from 12/03/2020 in [facility]  Total Score (max 30 points ) 29 29      Flowsheet Row ED to Hosp-Admission (Discharged) from 03/29/2020 in [facility] Pre-Admission Testing 60 from 03/03/2020 in [facility]PRE[facility] TEST[facility]  C-SSRS RISK CATEGORY No Risk Error: Question 6 not populated       DIAGNOSES:    ICD-10-CM   1. Major depression, recurrent, full remission (HCC)  F33.42     2. Memory deficit  R41.3     3. OSA on CPAP  G47.33     4. Inadequate sleep hygiene  Z72.821       Receiving Psychotherapy: Yes   Dr. Mardelle Matte Mitchum  RECOMMENDATIONS:  PDMP was reviewed. No controlled meds.  I provided 30 minutes of face to face time during this encounter, including time spent before and after the visit in records review, medical decision making, counseling pertinent to today's visit, and charting.   [name] is doing well on no treatment for depression.  We briefly discussed sleep hygiene.  I think his memory issues are related to age.  He did remarkably well on the animal naming test as well as Mini-Mental status exam.  He saw Dr. Arbutus Leas, neurologist, in 2018 for gait instability.  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  He did not have to follow up with her unless he worsened.  We can refer him back to Dr. Arbutus Leas if needed in the future but at this point I do not see that it is necessary.   I stressed the importance of taking his vitamins including B complex, vitamin D, multivitamin, fish oil, and any other vitamins or supplements recommended by his PCP.  Continue CPAP use. Continue therapy with Dr. [name]. Return in 3 months.   Melony Overly, PA-C

## 2023-03-30 ENCOUNTER — Ambulatory Visit: Payer: Medicare Other | Admitting: Psychiatry

## 2023-03-30 DIAGNOSIS — E1165 Type 2 diabetes mellitus with hyperglycemia: Secondary | ICD-10-CM

## 2023-03-30 DIAGNOSIS — Z630 Problems in relationship with spouse or partner: Secondary | ICD-10-CM

## 2023-03-30 DIAGNOSIS — G4733 Obstructive sleep apnea (adult) (pediatric): Secondary | ICD-10-CM

## 2023-03-30 DIAGNOSIS — F3341 Major depressive disorder, recurrent, in partial remission: Secondary | ICD-10-CM | POA: Diagnosis not present

## 2023-03-30 DIAGNOSIS — F5104 Psychophysiologic insomnia: Secondary | ICD-10-CM

## 2023-03-30 DIAGNOSIS — Z72821 Inadequate sleep hygiene: Secondary | ICD-10-CM

## 2023-03-30 DIAGNOSIS — Z9289 Personal history of other medical treatment: Secondary | ICD-10-CM

## 2023-03-30 NOTE — Progress Notes (Signed)
 Psychotherapy Progress Note [facility] [name], PhD LP  Patient ID: Antonio [name] [facility] "Antonio [name]    MRN: [name] Therapy format: Family therapy w/ patient -- accompanied by [name] Date: 03/30/2023      Start: 2:05p     Stop: 2:55p     Time Spent: 50 min Location: In-person   Session narrative (presenting needs, interim history, self-report of stressors and symptoms, applications of prior therapy, status changes, and interventions made in session) Recent med check, remains off lamotrigine entirely, briefly assessed potential early dementia pertinent to [name]'s concerns, 29/30 MMSE.  Impression sleep hygiene is likely responsible for most of his residual mood and reported forgetfulness.  Noted 2018 hx of MRI with ischemic changes.  Meanwhile, recommend best vitamin supply, sleep hygiene, and CPAP, as well as continue working out relationship concerns in therapy.    Today, c/o lightheadedness, still attributed to fluid on his ears, but he's been put on meclizine, which makes him sleepy, and that bothers him, too.  Not taking it now.  Recommended Mucinex to thin secretions, hopefully get it out, but difficult to be sure the idea will get hone and be applied, as he has been in the habit of naysaying medications even though he wants something to work.    Habitually up late, habitually conks out on the couch in front of the TV, and habitually procrastinates dog feedings from 8p to 9pm or later.  Only willing to do one "honey do" item a day.  Agreed he is mostly not motivated to change the pattern.  [name] still c/o irritability, procrastination, and lack of any initiative for spending time or doing anything fun, let alone intimate, arousing him to start into his typical round of excuses and changing the subject.  Refocused, pressed to go ahead and enact what we've talked about plenty of times before going ahead and getting dog care done, so he can be clearer to wind down and  sleep earlier.  Again not certain about motivation, but agrees.  Back to the tension they have completing household tasks, encouraged to make sure he doesn't agonize too much about how many thing s [name] might want him to do, just pick something in the morning, something in the afternoon and give it what he honestly can.  Arranged a contest, [place] vs. Pt, each to to make a put-off effort -- Tx a door project at home, Pt choice of (1) fix floor molding, (2) put together a [employer] that's been sitting for years, and/or (3) take a stab at starting evening feedings nearer 8pm.    [name] also brings up how Antonio [name] seems to have a repetitive dream where he flails his arms.  In short consultation, interpreted as fairly likely he is having difficulty breathing and acting out, not some strange new problem.  Therapeutic modalities: Cognitive Behavioral Therapy, Solution-Oriented/Positive Psychology, Environmental manager, and Motivational Interviewing  Mental Status/Observations:  Appearance:   Casual     Behavior:  Appropriate  Motor:  Normal  Speech/Language:   Clear and Coherent  Affect:  Appropriate  Mood:  dysthymic  Thought process:  tangential  Thought content:    WNL  Sensory/Perceptual disturbances:    WNL  Orientation:  Fully oriented  Attention:  Good    Concentration:  Fair  Memory:  grossly intact  Insight:    Fair  Judgment:   Good  Impulse Control:  Fair   Risk Assessment: Danger to Self: No Self-injurious Behavior: No Danger  to Others: No Physical Aggression / Violence: No Duty to Warn: No Access to Firearms a concern: No  Assessment of progress:  stabilized  Diagnosis:   ICD-10-CM   1. 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 Relationship problem between partners  Z63.0     3. 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 Type 2 diabetes mellitus with hyperglycemia, unspecified whether long term insulin use (HCC)   E11.65     5. OSA on CPAP  G47.33     6. 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  Find 5/day to say "I'm frustrated" and check perceptions before reacting to what he believes he hears from [place] Continue general approach trying to be kinder on the first reaction, giving grace first with [place], not conditional on how he feels treated For encouragement's sake, if desired, ask [name] if she can tell when he is trying to keep from popping off, out of respect for her, or otherwise trying to respect her better Look for opportunities to establish a little quality time with [name], e.g., [place] show, and try just touching lightly to get used to it again, e.g., hold hands Pull out old favorite music and activities, including asking [name] to play cool jazz, to help with mood and possibly share [place] make priority of asking/getting [name]'s attention before expecting it -- he is older, long in the habit of self-absorption, and it's good form anyway Continue abstinence from addictive habits of knife-buying and cross-dressing.  Use other ways of self-soothing, preferably approach [name] for touch time and work on trusting her better to approach, in order to recruit normal sexual interest instead. Self-remind that cross-dressing is an old attempt to feel closer when couldn't; take the urge as a cue to get closer in reality now Take into account Pt's age, circadian rhythm problem, chronic difficulty treating sleep apnea, and hx of ischemic brain changes when interpreting problems with cognition, motivation, and emotional dyscontrol [name] encouraged to stop sleeping on the floor, return to the bed (if she can do it without feeling like she is selling out her own protest).  Will encourage also to improve her own sleep habit from 4 hr/night. Continue  joint sessions as able and interested Physiological interventions for pain, fatigue, sleep, energy regulation, and other health issues Add omega 3 for added antiinflammatory benefit Try melatonin 1-2 hrs before sleep, 3-5mg .  At least by 10:30pm., and consent to go to bed earlier, say, by midnight. Carb curfew before bed, OK to snack earlier Work on sleep readiness by making sure stimulants out of the way well before bedtime.  Stop coffee by 3pm if at all possible.  Continue overall soda reduction Turn the lights down about 9pm, bring up light as early as possible Go on to bed by midnight, no matter what Get adequate activity/movement -- resume walking or stationary bike if it suits Practice 5-7 big, deep breaths morning and anytime need a refresher, on suspicion of underbreathing Kidney care, including reducing sodas and good fluid by day, hold down late evening fluids Address pain as needed Make sure CPAP is adjusted, clean, and maintained correctly.  Recheck need if desired. Enable earlier bed time by pressing through dog care tasks Maintain diabetic regimen  Check actual expenses before assuming things are too expensive Check situational BP to help inform physician Dog care Try to work ahead in the day, do a little something when thinking of it, like preload dishes Try to knock out evening dog care in 1-2 sittings, not interrupt repeatedly and stretch it out Switch from yelling to using a clicker or other noisemaker Other recommendations/advice -- As may be noted above.  Continue to utilize previously learned skills ad lib. Medication compliance -- Maintain medication as prescribed and work faithfully with relevant prescriber(s) if any changes are desired or seem indicated. Crisis service -- Aware of call list and work-in appts.  Call the clinic on-call service, 988/hotline, 911, or present to [facility] or ER if any life-threatening psychiatric crisis. Followup -- Return for time as already  scheduled.  Next scheduled visit with me 04/27/2023.  Next scheduled in this office 04/27/2023.  Robley Fries, PhD [name], PhD LP Clinical Psychologist, [facility] [address] [place], [place] [address] [phone]

## 2023-04-19 ENCOUNTER — Encounter: Payer: Self-pay | Admitting: Physician Assistant

## 2023-04-23 ENCOUNTER — Encounter: Payer: Self-pay | Admitting: Physician Assistant

## 2023-04-27 ENCOUNTER — Ambulatory Visit: Payer: Medicare Other | Admitting: Psychiatry

## 2023-04-27 DIAGNOSIS — F5104 Psychophysiologic insomnia: Secondary | ICD-10-CM

## 2023-04-27 DIAGNOSIS — Z630 Problems in relationship with spouse or partner: Secondary | ICD-10-CM | POA: Diagnosis not present

## 2023-04-27 DIAGNOSIS — G4733 Obstructive sleep apnea (adult) (pediatric): Secondary | ICD-10-CM

## 2023-04-27 DIAGNOSIS — Z9289 Personal history of other medical treatment: Secondary | ICD-10-CM

## 2023-04-27 DIAGNOSIS — E1165 Type 2 diabetes mellitus with hyperglycemia: Secondary | ICD-10-CM

## 2023-04-27 DIAGNOSIS — F331 Major depressive disorder, recurrent, moderate: Secondary | ICD-10-CM | POA: Diagnosis not present

## 2023-04-27 NOTE — Progress Notes (Signed)
 Psychotherapy Progress Note [facility] [name], PhD LP  Patient ID: Antonio [name] [facility] "Antonio [name]    MRN: [id number] Therapy format: Family therapy w/ patient -- accompanied by [name] Date: 04/27/2023      Start: 3:10p     Stop: 4:00p     Time Spent: 50 min Location: In-person   Session narrative (presenting needs, interim history, self-report of stressors and symptoms, applications of prior therapy, status changes, and interventions made in session) Visibly more energy and quicker to be irritable.  Reports he can't do "all these things" [name] wants him to do, starts right in having lists of things to do and a new list the next day.  Pretty clearly exaggerating, and [name] stumbling into playing the part sometimes.  Refereed process, brokered understanding that there are 2 ways to prevent this fight, one being that [place] shorten what she gives him, the other being for Antonio [name] to accept that she is only getting her thoughts organized and he is still free to decide what can be, or must be done and should recognize it's his own thoughts painting her as the tyrant.    Less complaint of being tired overall, but does say he went to bed at 1pm yesterday afternoon and slept tiil 8am this morning, as if it's some kind of problem, and now he feels all messed up.  Interpreted that he undoubtedly needed the extra rest, was probably operating in a deficit, it's good that he caught up, and he is now perfectly positioned to go to bed earlier tonight for having gotten up 2 hrs earlier than usual.  As for feeling badly, educated on temporary jet lag and predicted he'll be better tomorrow as long as he sticks with the change in hours.  [name] notes her doubt, which is a well-earned cynicism, but encouraged her to try to hold off negative commentary just when we have some understanding, agreement, and maybe memory and commitment enough to do a better thing.  Addressed [name]'s wishes, and how not  all of them can be granted at the same time.  Pressed whether she'd rather have relief from from instant negativity or lots of complaining but eventually he comes around, she opted for him doing the better thing, acknowledged she can try to take more of it with a grain of salt.  At the same time, took up [name]'s complaint of not being able to help himself but to shoot off in anger.  Took a recent situation where he had put some unneeded molding in the truck and [place] thought she'd do a favor and bring it in to the workspace where he was replacing and he blew up when he saw it.  Beyond rethinking it that she was probably trying to help -- and her rethinking whether to move things he'd been working with, coached Antonio [name] in alternative behaviors when irritated, like count to 10 ([name]'s naive suggestion), or take a time out before speaking, or growl at the moment without trying to say anything.  Validated that [name] could easily take any of those less personally.  Prodded to practice in session, remember ing the moment that frustrated him and going for the growl instead, found he could laugh a little, still relieve frustration.  Various other issues picked up in their turn in what was a more freewheeling session.  Overall, still pushing Antonio [name] away from profuse complaining and claiming helplessness, pressing [name] to simplify her messages and try to resist piling on  when frustrated or offended, since it will only drive their negative cycle further and often enough take [name]'s attention off the thing she just got him to understand.  Therapeutic modalities: Cognitive Behavioral Therapy, Solution-Oriented/Positive Psychology, Assertiveness/Communication, and Interpersonal  Mental Status/Observations:  Appearance:   Casual     Behavior:  Blaming  Motor:  Normal  Speech/Language:   Clear and Coherent  Affect:  Appropriate  Mood:  irritable  Thought process:  concrete  Thought content:    Defensive assumptions   Sensory/Perceptual disturbances:    WNL  Orientation:  Fully oriented  Attention:  Good    Concentration:  Fair  Memory:  grossly intact  Insight:    Fair  Judgment:   Good  Impulse Control:  Fair   Risk Assessment: Danger to Self: No Self-injurious Behavior: No Danger to Others: No Physical Aggression / Violence: No Duty to Warn: No Access to Firearms a concern: No  Assessment of progress:  stabilized  Diagnosis:   ICD-10-CM   1. Major depressive disorder, recurrent episode, moderate (HCC)  F33.1     2. Relationship problem between partners  Z63.0     3. 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 Type 2 diabetes mellitus with hyperglycemia, unspecified whether long term insulin use (HCC)  E11.65     5. OSA on CPAP  G47.33     6. 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  Options to growl, time out, or something else first. If able, practice saying "I'm frustrated" or ask what [name] means before reacting to what he believes he hears from her Continue general approach trying to be kinder on the first reaction, giving grace first with [employer], don't make it have anything to do with how he feels like he's being treated For encouragement's sake, if desired, ask [name] if she can tell when he is trying Look for opportunities to establish a little quality time together, e.g., shared TV or music, with just touching lightly to get used to it again, e.g., hold hands Pull out old favorite music and activities, including asking [name] to play cool jazz, to help with mood and possibly share [place] make priority of asking/getting [name]'s attention before expecting it -- he is older, sleep-disordered, and long in the habit of self-absorption, plus it's good form anyway.  Likewise try to keep instructions, commentaries, and  complaints to few points at once -- he is more likely to react if feeling flooded.  Take into account Pt's age, circadian rhythm problem, chronic difficulty treating sleep apnea, and hx of ischemic brain changes when interpreting problems with cognition, motivation, and emotional dyscontrol. Continue abstinence from addictive habits of knife-buying and cross-dressing.  Use other ways of self-soothing, preferably approach [name] for touch time and work on trusting her better to approach, in order to recruit normal sexual interest instead. Self-remind that cross-dressing is an old attempt to feel closer when couldn't; take the urge as a cue to get closer in reality now [place] encouraged to stop sleeping on the floor, return to the bed (if she can do it without feeling like she is selling out her own protest).  Will encourage also to improve her own sleep habit from 4 hr/night. Continue joint sessions as able and interested Physiological interventions for pain, fatigue, sleep, energy regulation, and  other health issues Add omega 3 for added antiinflammatory benefit Try melatonin 1-2 hrs before sleep, 3-5mg .  At least by 10:30pm., and consent to go to bed earlier, say, by midnight. Carb curfew before bed, OK to snack earlier Work on sleep readiness by making sure stimulants out of the way well before bedtime.  Stop coffee by 3pm if at all possible.  Continue overall soda reduction Turn the lights down about 9pm, bring up light as early as possible Go on to bed by midnight, no matter what Get adequate activity/movement -- resume walking or stationary bike if it suits Practice 5-7 big, deep breaths morning and anytime need a refresher, on suspicion of underbreathing Kidney care, including reducing sodas and good fluid by day, hold down late evening fluids Address pain as needed Make sure CPAP is adjusted, clean, and maintained correctly.  Recheck need if desired. Enable earlier bed time by pressing through  dog care tasks Maintain diabetic regimen Check actual expenses before assuming things are too expensive Check situational BP to help inform physician Dog care Try to work ahead in the day, do a little something when thinking of it, like preload dishes Try to knock out evening dog care in 1-2 sittings, not interrupt repeatedly and stretch it out Switch from yelling to using a clicker or other noisemaker Other recommendations/advice -- As may be noted above.  Continue to utilize previously learned skills ad lib. Medication compliance -- Maintain medication as prescribed and work faithfully with relevant prescriber(s) if any changes are desired or seem indicated. Crisis service -- Aware of call list and work-in appts.  Call the clinic on-call service, 988/hotline, 911, or present to [facility] or ER if any life-threatening psychiatric crisis. Followup -- Return for time as already scheduled.  Next scheduled visit with me 05/24/2023.  Next scheduled in this office 05/24/2023.  Robley Fries, PhD [name], PhD LP Clinical Psychologist, [facility] [address], [address] [place], [place] [address] [phone]

## 2023-05-03 ENCOUNTER — Encounter: Payer: Self-pay | Admitting: Physician Assistant

## 2023-05-03 ENCOUNTER — Ambulatory Visit: Admitting: Physician Assistant

## 2023-05-03 VITALS — BP 132/71 | HR 90 | Resp 18 | Ht 65.0 in | Wt 145.0 lb

## 2023-05-03 DIAGNOSIS — R42 Dizziness and giddiness: Secondary | ICD-10-CM | POA: Diagnosis not present

## 2023-05-03 NOTE — Patient Instructions (Addendum)
 Follow up pending on the MRI and MRA results [phone]
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
## 2023-05-23 ENCOUNTER — Ambulatory Visit
Admission: RE | Admit: 2023-05-23 | Discharge: 2023-05-23 | Disposition: A | Discharge status: Home / Self Care | Source: Ambulatory Visit | Attending: Physician Assistant | Admitting: Physician Assistant

## 2023-05-23 MED ORDER — GADOPICLENOL 0.5 MMOL/ML IV SOLN
7.0000 mL | Freq: Once | INTRAVENOUS | Status: AC | PRN
  Administered 2023-05-23: 7 mL via INTRAVENOUS

## 2023-05-24 ENCOUNTER — Ambulatory Visit: Payer: Medicare Other | Admitting: Psychiatry

## 2023-05-24 DIAGNOSIS — Z9289 Personal history of other medical treatment: Secondary | ICD-10-CM

## 2023-05-24 DIAGNOSIS — E1165 Type 2 diabetes mellitus with hyperglycemia: Secondary | ICD-10-CM

## 2023-05-24 DIAGNOSIS — Z630 Problems in relationship with spouse or partner: Secondary | ICD-10-CM | POA: Diagnosis not present

## 2023-05-24 DIAGNOSIS — G4733 Obstructive sleep apnea (adult) (pediatric): Secondary | ICD-10-CM

## 2023-05-24 DIAGNOSIS — Z72821 Inadequate sleep hygiene: Secondary | ICD-10-CM

## 2023-05-24 DIAGNOSIS — F331 Major depressive disorder, recurrent, moderate: Secondary | ICD-10-CM | POA: Diagnosis not present

## 2023-05-24 DIAGNOSIS — F5104 Psychophysiologic insomnia: Secondary | ICD-10-CM

## 2023-05-24 NOTE — Progress Notes (Signed)
 Psychotherapy Progress Note [facility] Delora Ferry, PhD LP  Patient ID: Antonio [name] [facility] "[name]")    MRN: [id number] Therapy format: Individual psychotherapy Date: 05/24/2023      Start: 3:18p     Stop: 4:04p     Time Spent: 46 min Location: In-person   Session narrative (presenting needs, interim history, self-report of stressors and symptoms, applications of prior therapy, status changes, and interventions made in session) Per EHR, MRI of the brain performed yesterday, results pending.  In person, says [name] is in hospital with an intestinal blockage.  They have been able to determine it is not impaction, but a restriction point from previous diverticulitis and presumably scarring.  Scheduled for robotic surgery 07/18/23.  Says she's bored to death, but [name] is visiting regularly.  As for the dogs and the large responsibility of them, 5 got picked for fostering elsewhere, so he is down to 13 dogs to care for.  Says he's been calm until getting up this morning, when he got up lightheaded again, and then [place] made some smart remark about him not being up yet.  Otherwise OK, and clearly more conversational one to one today.  Been going to bed 11:30/12 at night, with no indications of bad sleep, though sleep apnea remains a question.  Despite sounding clear today, distinctly describes a feeling of "static" in his brain, and dizziness without loss of balance, and it will subside after a while.  Probed possibility of unrecognized b.s. fluctuations, recommended check level when he has the "static".  Otherwise possible he is having sxs of a combination of underbreathing, underhydration, and h/o ischemic changes, all of which suggest sharper self-care going forward.  Messaged neurology provider to establish coordination and sharing of findings.  Addendum: Patient message noticed on EHR later (06/17/23), from neurology on 06/05/23, stating the MRA shows significant changes in  circulation, though without acute stroke, masses, or bleeding.  Will have implications for lifestyle, diabetes management, and probably medication.  Will apprise psychiatry who sees him this week.  Therapeutic modalities: Cognitive Behavioral Therapy, Solution-Oriented/Positive Psychology, and Ego-Supportive  Mental Status/Observations:  Appearance:   Casual     Behavior:  Appropriate  Motor:  Normal and baseline stiff gait  Speech/Language:   Clear and Coherent  Affect:  Appropriate  Mood:  More energized  Thought process:  normal  Thought content:    WNL  Sensory/Perceptual disturbances:    WNL  Orientation:  Fully oriented  Attention:  Good    Concentration:  Good  Memory:  WNL  Insight:    Fair  Judgment:   Good  Impulse Control:  Good   Risk Assessment: Danger to Self: No Self-injurious Behavior: No Danger to Others: No Physical Aggression / Violence: No Duty to Warn: No Access to Firearms a concern: No  Assessment of progress:  progressing  Diagnosis:   ICD-10-CM   1. Major depressive disorder, recurrent episode, moderate (HCC)  F33.1     2. 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 Relationship problem between partners  Z63.0     4. Type 2 diabetes mellitus with hyperglycemia, unspecified whether long term insulin  use (HCC)  E11.65     5. OSA on CPAP  G47.33     6. Inadequate sleep hygiene  Z72.821     7. 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  Options to growl, time out, or something else first. If able, practice saying "I'm frustrated" or ask what [name] means before reacting to what he believes he hears from her Continue general approach trying to be kinder on the first reaction, giving  grace first with [place], don't make it have anything to do with how he feels like he's being treated For encouragement's sake, if desired, ask [name] if she can tell when he is trying Look for opportunities to establish a little quality time together, e.g., shared TV or music, with just touching lightly to get used to it again, e.g., hold hands Pull out old favorite music and activities, including asking [name] to play cool jazz, to help with mood and possibly share [place] make priority of asking/getting [name]'s attention before expecting it -- he is older, sleep-disordered, and long in the habit of self-absorption, plus it's good form anyway.  Likewise try to keep instructions, commentaries, and complaints to few points at once -- he is more likely to react if feeling flooded.  Take into account Pt's age, circadian rhythm problem, chronic difficulty treating sleep apnea, and hx of ischemic brain changes when interpreting problems with cognition, motivation, and emotional dyscontrol. Continue abstinence from addictive habits of knife-buying and cross-dressing.  Use other ways of self-soothing, preferably approach [name] for touch time and work on trusting her better to approach, in order to recruit normal sexual interest instead. Self-remind that cross-dressing is an old attempt to feel closer when couldn't; take the urge as a cue to get closer in reality now [place] encouraged to stop sleeping on the floor, return to the bed (if she can do it without feeling like she is selling out her own protest).  Will encourage also to improve her own sleep habit from 4 hr/night. Continue joint sessions as able and interested Physiological interventions for pain, fatigue, sleep, energy regulation, and other health issues Add omega 3 for added antiinflammatory benefit Try melatonin 1-2 hrs before sleep, 3-5mg .  At least by 10:30pm., and consent to go to bed earlier, say, by midnight. Carb curfew before bed, OK to snack  earlier Work on sleep readiness by making sure stimulants out of the way well before bedtime.  Stop coffee by 3pm if at all possible.  Continue overall soda reduction Turn the lights down about 9pm, bring up light as early as possible Go on to bed by midnight, no matter what Get adequate activity/movement -- resume walking or stationary bike if it suits Practice 5-7 big, deep breaths morning and anytime need a refresher, on suspicion of underbreathing Kidney care, including reducing sodas and good fluid by day, hold down late evening fluids Address pain as needed Make sure CPAP is adjusted, clean, and maintained correctly.  Recheck need if desired. Enable earlier bed time by pressing through dog care tasks Maintain diabetic regimen Check actual expenses before assuming things are too expensive Check situational BP to help inform physician Dog care Try to work ahead in the day, do a little something when thinking of it, like preload dishes Try to knock out evening dog care in 1-2 sittings, not interrupt repeatedly and stretch it out Switch from yelling to using a clicker or other noisemaker Other recommendations/advice -- As may be noted above.  Continue to utilize previously learned skills ad lib. Medication compliance -- Maintain medication as  prescribed and work faithfully with relevant prescriber(s) if any changes are desired or seem indicated. Crisis service -- Aware of call list and work-in appts.  Call the clinic on-call service, 988/hotline, 911, or present to [facility] or ER if any life-threatening psychiatric crisis. Followup -- Return for time as already scheduled, avail earlier @ PT's need.  Next scheduled visit with me 06/29/2023.  Next scheduled in this office 06/20/2023.  Maretta Shaper, PhD Delora Ferry, PhD LP Clinical Psychologist, [facility] [address], [address] [place], [place] [address] [phone]

## 2023-05-25 ENCOUNTER — Ambulatory Visit: Admitting: Physician Assistant

## 2023-05-31 ENCOUNTER — Ambulatory Visit: Admitting: Physician Assistant

## 2023-06-05 ENCOUNTER — Ambulatory Visit: Payer: Self-pay | Admitting: Physician Assistant

## 2023-06-20 ENCOUNTER — Encounter: Payer: Self-pay | Admitting: Physician Assistant

## 2023-06-20 ENCOUNTER — Ambulatory Visit: Payer: Medicare Other | Admitting: Physician Assistant

## 2023-06-20 DIAGNOSIS — R454 Irritability and anger: Secondary | ICD-10-CM | POA: Diagnosis not present

## 2023-06-20 DIAGNOSIS — R5383 Other fatigue: Secondary | ICD-10-CM

## 2023-06-20 DIAGNOSIS — F4323 Adjustment disorder with mixed anxiety and depressed mood: Secondary | ICD-10-CM | POA: Diagnosis not present

## 2023-06-20 DIAGNOSIS — R5381 Other malaise: Secondary | ICD-10-CM | POA: Diagnosis not present

## 2023-06-20 DIAGNOSIS — G4733 Obstructive sleep apnea (adult) (pediatric): Secondary | ICD-10-CM | POA: Diagnosis not present

## 2023-06-20 MED ORDER — CITALOPRAM HYDROBROMIDE 10 MG PO TABS: ORAL_TABLET | ORAL | 1 refills | Status: DC

## 2023-06-20 NOTE — Progress Notes (Unsigned)
 [facility]  Patient ID: Antonio [name],  MRN: [id number]  PCP: Antonio Lathe, MD  Date of Evaluation: 06/20/2023 Time spent:30 minutes  Chief Complaint:  Chief Complaint   Depression; Follow-up    HISTORY/CURRENT STATUS: For routine med check. Antonio [name], wife is with him.   Antonio [name] just got out of the hospital, had bowel blockage requiring surgery. She was there for several weeks. She's still very weak and he's taking care of all 13 dogs as well as doing some things around the house.    Antonio [name] states Antonio [name] is more irritable and angry, yells at the dogs a lot and takes things out on her.  This isn't a new issue but seems worse now.  He says he's really worried about her which makes him anxious, depressed, and afraid something bad will happen.  He's not going to bed so late now and sleeps 7-9 hours.  Is still really tired all the time. Motivation is low, although he gets up and does the things that are necessary.  No feelings of hopelessness.   Appetite has not changed.  Personal hygiene is normal.  No PA.  Denies suicidal or homicidal thoughts.  Patient denies increased energy with decreased need for sleep, increased talkativeness, racing thoughts, impulsivity or risky behaviors, increased spending, increased libido, grandiosity, increased irritability or anger, paranoia, or hallucinations.  Review of Systems  Constitutional:  Positive for malaise/fatigue.  HENT:  Positive for tinnitus.   Eyes: Negative.   Respiratory: Negative.    Cardiovascular: Negative.   Gastrointestinal: Negative.   Genitourinary: Negative.   Musculoskeletal: Negative.   Skin: Negative.   Neurological:        He's been evaluated by neuro for vertigo, had MRI, MRA, hasn't had f/u appt yet for results. No falls but balance is off. Denies syncope, seizures, numbness, tingling, tremor, tics,  slurred speech, confusion.  Endo/Heme/Allergies: Negative.    Individual Medical History/ Review of  Systems: Changes? :Yes   see HPI    Past medications for mental health diagnoses include: Wellbutrin  SR Wellbutrin  XL, Zoloft caused falls, Cymbalta , naltrexone  for post-COVID fog. Lamictal , Depakote , Trileptal  caused dizziness, fatigue, weakness.   Allergies: Metformin  and related, Wellbutrin  [bupropion ], and Zoloft [sertraline]  Current Medications:  Current Outpatient Medications:    acetaminophen  (TYLENOL ) 500 MG tablet, , Disp: , Rfl:    amLODipine (NORVASC) 5 MG tablet, Take 1 tablet by mouth daily., Disp: , Rfl:    b complex vitamins capsule, Take 1 capsule by mouth daily., Disp: , Rfl:    cholecalciferol  (VITAMIN D ) 1000 units tablet, Take 5,000 Units by mouth in the morning and at bedtime., Disp: , Rfl:    citalopram (CELEXA) 10 MG tablet, 1/2 po every day for 1 week, then 1 po every day., Disp: 30 tablet, Rfl: 1   ELDERBERRY PO, Take 2 each by mouth daily. Gummies, Disp: , Rfl:    famotidine  (PEPCID ) 10 MG tablet, Take 10 mg by mouth daily. , Disp: , Rfl:    ferrous sulfate  325 (65 FE) MG tablet, Take 325 mg by mouth daily with breakfast., Disp: , Rfl:    JARDIANCE  10 MG TABS tablet, Take 10 mg by mouth daily., Disp: , Rfl:    potassium chloride  SA (K-DUR,KLOR-CON ) 20 MEQ tablet, Take 20 mEq by mouth daily. , Disp: , Rfl: 3   Turmeric Curcumin 500 MG CAPS, , Disp: , Rfl:    UNABLE TO FIND, CPAP, Disp: , Rfl:    atorvastatin (LIPITOR) 10 MG tablet, ,  Disp: , Rfl:    empagliflozin  (JARDIANCE ) 25 MG TABS tablet, Take 1 tablet (25 mg total) by mouth daily before breakfast. (Patient not taking: Reported on 06/30/2020), Disp: 30 tablet, Rfl: 11   sildenafil (REVATIO) 20 MG tablet, , Disp: , Rfl:    traMADol  (ULTRAM ) 50 MG tablet, Take 1-2 tablets (50-100 mg total) by mouth every 6 (six) hours as needed for moderate pain or severe pain. (Patient not taking: Reported on 06/20/2023), Disp: 20 tablet, Rfl: 0 Medication Side Effects: none  Family Medical/ Social History: Changes? no  MENTAL  HEALTH EXAM:  There were no vitals taken for this visit.There is no height or weight on file to calculate BMI.  General Appearance: Casual and Well Groomed  Eye Contact:  Good  Speech:  Clear and Coherent and Normal Rate  Volume:  Normal  Mood:  Anxious and sad  Affect:  Congruent  Thought Process:  Goal Directed and Descriptions of Associations: Circumstantial  Orientation:  Full (Time, Place, and Person)  Thought Content: Logical   Suicidal Thoughts:  No  Homicidal Thoughts:  No  Memory:  Immediate;   Fair Recent;   Fair Remote;   Good  Judgement:  Good  Insight:  Good  Psychomotor Activity:  Normal  Concentration:  Concentration: Good and Attention Span: Good  Recall:  Good  Fund of Knowledge: Good  Language: Good  Assets:  Communication Skills Desire for Improvement Financial Resources/Insurance Housing Transportation  ADL's:  Intact  Cognition: WNL  Prognosis:  Good   PCP follows labs I reviewed MRI and MRA.  Did not discuss results in detail with him, advised him to get back in with neurology for the results and further treatment plan.  Mini-Mental    Flowsheet Row Office Visit from 03/19/2023 in [facility] Office Visit from 12/03/2020 in [facility]  Total Score (max 30 points ) 29 29      Flowsheet Row ED to Hosp-Admission (Discharged) from 03/29/2020 in [place] [facility] Pre-Admission Testing 60 from 03/03/2020 in [facility]  C-SSRS RISK CATEGORY No Risk Error: Question 6 not populated       DIAGNOSES:    ICD-10-CM   1. Situational mixed anxiety and depressive disorder  F43.23     2. OSA on CPAP  G47.33     3. Irritability and anger  R45.4     4. Malaise and fatigue  R53.81    R53.83       Receiving Psychotherapy: Yes   Dr. Jonelle Neri Mitchum  RECOMMENDATIONS:  PDMP was reviewed. No controlled meds.  I provided 30 minutes of face to face time  during this encounter, including time spent before and after the visit in records review, medical decision making, counseling pertinent to today's visit, and charting.   [name] had asked if Celexa would be an option for him.  She takes it and it is effective without any side effects.  I was planning to recommend another SSRI, even though he has been on several in the past.  He did not do well with SNRIs or mood stabilizers.  Adding Celexa is appropriate.  Benefits, risk and side effects were discussed and he accepts.  Start Celexa 10 mg, one half p.o. daily for 1 week and then increase to 1 p.o. daily.   Continue vitamins as per med list. Continue CPAP use. Continue therapy with Dr. Delora Ferry. Return in 6-8 weeks.   Marvia Slocumb, PA-C

## 2023-06-21 ENCOUNTER — Encounter: Payer: Self-pay | Admitting: Physician Assistant

## 2023-06-21 ENCOUNTER — Telehealth: Payer: Self-pay | Admitting: Physician Assistant

## 2023-06-21 NOTE — Telephone Encounter (Signed)
 Pt is wanting to speak with someone to get the results from the 3 MRI that were done

## 2023-06-29 ENCOUNTER — Ambulatory Visit: Payer: Medicare Other | Admitting: Psychiatry

## 2023-06-29 NOTE — Progress Notes (Signed)
 Admin note for non-service contact  Patient ID: Antonio [name]  MRN: [id number] DATE: 06/29/2023  No show for individual appt 3pm.  Courtesy call to listed phone number, reached [name], who is home from hospital and major surgery.  Informed of the miss, determined that he failed to write down the time on their calendar, and agreed we can defer followup till next scheduled appt July 10.    Clinically, word is he has been substantially more attentive, focused, and positive while she has been convalescing, and it's very welcome.  Waive NS fee in view of Pt's age, circumstances, and very faithful track record of making appointments.  Maretta Shaper, PhD Delora Ferry, PhD LP Clinical Psychologist, [facility] [address], [address] [place], [place] [address] [phone]

## 2023-06-29 NOTE — Progress Notes (Deleted)
 Psychotherapy Progress Note [facility] Delora Ferry, PhD LP  Patient ID: Antonio [name] [facility] "Antonio [name]")    MRN: [id number] Therapy format: {Therapy Types:21967::"Individual psychotherapy"} Date: 06/29/2023      Start: ***:***     Stop: ***:***     Time Spent: *** min Location: {SvcLoc:22530::"In-person"}   Session narrative (presenting needs, interim history, self-report of stressors and symptoms, applications of prior therapy, status changes, and interventions made in session) ***  Therapeutic modalities: {AM:23362::"Cognitive Behavioral Therapy","Solution-Oriented/Positive Psychology"}  Mental Status/Observations:  Appearance:   {PSY:22683}     Behavior:  {PSY:21022743}  Motor:  {PSY:22302}  Speech/Language:   {PSY:22685}  Affect:  {PSY:22687}  Mood:  {PSY:31886}  Thought process:  {PSY:31888}  Thought content:    {[phone]}  Sensory/Perceptual disturbances:    {[phone]}  Orientation:  {Psych Orientation:23301::"Fully oriented"}  Attention:  {Good-Fair-Poor ratings:23770::"Good"}    Concentration:  {Good-Fair-Poor ratings:23770::"Good"}  Memory:  {PSY:[phone]}  Insight:    {Good-Fair-Poor ratings:23770::"Good"}  Judgment:   {Good-Fair-Poor ratings:23770::"Good"}  Impulse Control:  {Good-Fair-Poor ratings:23770::"Good"}   Risk Assessment: Danger to Self: {Risk:22599::"No"} Self-injurious Behavior: {Risk:22599::"No"} Danger to Others: {Risk:22599::"No"} Physical Aggression / Violence: {Risk:22599::"No"} Duty to Warn: {AMYesNo:22526::"No"} Access to Firearms a concern: {AMYesNo:22526::"No"}  Assessment of progress:  {Progress:22147::"progressing"}  Diagnosis: No diagnosis found. Plan:  *** Other recommendations/advice -- As may be noted above.  Continue to utilize previously learned skills ad lib. Medication compliance -- Maintain medication as prescribed and work faithfully with relevant prescriber(s) if any changes are desired or  seem indicated. Crisis service -- Aware of call list and work-in appts.  Call the clinic on-call service, 988/hotline, 911, or present to [facility] or ER if any life-threatening psychiatric crisis. Followup -- No follow-ups on file.  Next scheduled visit with me 08/02/2023.  Next scheduled in this office 08/02/2023.  Maretta Shaper, PhD Delora Ferry, PhD LP Clinical Psychologist, [facility] [address], [address] [place], [place] [address] [phone]

## 2023-07-23 ENCOUNTER — Encounter (INDEPENDENT_AMBULATORY_CARE_PROVIDER_SITE_OTHER): Payer: Self-pay

## 2023-08-02 ENCOUNTER — Ambulatory Visit: Payer: Medicare Other | Admitting: Psychiatry

## 2023-08-02 DIAGNOSIS — Z72821 Inadequate sleep hygiene: Secondary | ICD-10-CM

## 2023-08-02 DIAGNOSIS — F3341 Major depressive disorder, recurrent, in partial remission: Secondary | ICD-10-CM

## 2023-08-02 DIAGNOSIS — Z630 Problems in relationship with spouse or partner: Secondary | ICD-10-CM | POA: Diagnosis not present

## 2023-08-02 DIAGNOSIS — F5104 Psychophysiologic insomnia: Secondary | ICD-10-CM | POA: Diagnosis not present

## 2023-08-02 DIAGNOSIS — Z9289 Personal history of other medical treatment: Secondary | ICD-10-CM | POA: Diagnosis not present

## 2023-08-02 NOTE — Progress Notes (Signed)
 Psychotherapy Progress Note [facility] Jodie Kendall, PhD LP  Patient ID: Antonio [name])    MRN: [id number] Therapy format: Family therapy w/ patient -- accompanied by Antonio [name] Antonio [name] Date: 08/02/2023      Start: 3:06p     Stop: 3:55p     Time Spent: 49 min Location: In-person   Session narrative (presenting needs, interim history, self-report of stressors and symptoms, applications of prior therapy, status changes, and interventions made in session) EHR notes recent contact for sinusitis and diabetes, plus MRI results in May with chronic circulation changes but no arterial occlusions.    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  Support/validation provided.   Re Antonio [name]'s health and behavior, Antonio [name] reports he has been consistently much nicer since she was hospitalized and since he got on an antidepressant (Celexa ).  Very encouraged.  For his part, Antonio [name] is absent any crotchety comments right now, comes across clearheaded and sincere in wanting to do his best by her.  Still struggles to get through dog care tasks and his long-established tendency to do a little and rest a lot, but he's not complaining noticeably about anything, and he does sound clearer-headed today.  Sinus issues are suspected, which may be part of why CPAP has not been acclimated to.  Validated that it does sound in his voice quality like he has some degree of either congestion or narrowed passages.  Has an ENT referral already to evaluate.  Addressed the pesky slow dog care habit, recommending some targeted practice diving in and other measures to prepare himself bette to go ahead rather than rest too easily or frequently.  Affirmed return of some energy and focus, which translates to somewhat better stamina at 83, and the decision to try an antidepressant after all this time  leaving it out.  Therapeutic modalities: Cognitive Behavioral Therapy, Solution-Oriented/Positive Psychology, and Ego-Supportive  Mental Status/Observations:  Appearance:   Casual     Behavior:  Appropriate  Motor:  Normal  Speech/Language:   Clear and Coherent  Affect:  Appropriate  Mood:  normal and with residual aggravations with self  Thought process:  normal  Thought content:    WNL  Sensory/Perceptual disturbances:    WNL  Orientation:  Fully oriented  Attention:  Good    Concentration:  Good  Memory:  WNL  Insight:    Fair  Judgment:   Good  Impulse Control:  Variable   Risk Assessment: Danger to Self: No Self-injurious Behavior: No Danger to Others: No Physical Aggression / Violence: No Duty to Warn: No Access to Firearms a concern: No  Assessment of progress:  progressing well  Diagnosis:   ICD-10-CM   1. 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 Relationship problem between partners  Z63.0     4. Inadequate sleep hygiene  Z72.821     5. 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  Options to growl, time out, or something else first. If able, practice saying I'm frustrated or Antonio [name] what Antonio [name] means before reacting to what he believes he hears from her Continue general  approach trying to be kinder on the first reaction, giving grace first with [place], don't make it have anything to do with how he feels like he's being treated For encouragement's sake, if desired, Antonio [name] Antonio [name] if she can tell when he is trying Look for opportunities to establish a little quality time together, e.g., shared TV or music, with just touching lightly to get used to it again, e.g., hold hands Pull out old favorite music and activities, including asking  [name] to play cool jazz, to help with mood and possibly share [place] make priority of asking/getting Antonio [name]'s attention before expecting it -- he is older, sleep-disordered, and long in the habit of self-absorption, plus it's good form anyway.  Likewise try to keep instructions, commentaries, and complaints to few points at once -- he is more likely to react if feeling flooded.  Take into account Pt's age, circadian rhythm problem, chronic difficulty treating sleep apnea, and hx of ischemic brain changes when interpreting problems with cognition, motivation, and emotional dyscontrol. Continue abstinence from addictive habits of knife-buying and cross-dressing.  Use other ways of self-soothing, preferably approach Antonio [name] for touch time and work on trusting her better to approach, in order to recruit normal sexual interest instead. Self-remind that cross-dressing is an old attempt to feel closer when couldn't; take the urge as a cue to get closer in reality now [place] encouraged to stop sleeping on the floor, return to the bed (if she can do it without feeling like she is selling out her own protest).  Will encourage also to improve her own sleep habit from 4 hr/night. Continue joint sessions as able and interested Physiological interventions for pain, fatigue, sleep, energy regulation, and other health issues Add omega 3 for added antiinflammatory benefit Try melatonin 1-2 hrs before sleep, 3-5mg .  At least by 10:30pm., and consent to go to bed earlier, say, by midnight. Carb curfew before bed, OK to snack earlier Work on sleep readiness by making sure stimulants out of the way well before bedtime.  Stop coffee by 3pm if at all possible.  Continue overall soda reduction Turn the lights down about 9pm, bring up light as early as possible Go on to bed by midnight, no matter what Get adequate activity/movement -- resume walking or stationary bike if it suits Practice 5-7 big, deep breaths morning and anytime need  a refresher, on suspicion of underbreathing Kidney care, including reducing sodas and good fluid by day, hold down late evening fluids Address pain as needed Make sure CPAP is adjusted, clean, and maintained correctly.  Recheck need if desired. Enable earlier bed time by pressing through dog care tasks Maintain diabetic regimen Check actual expenses before assuming things are too expensive Check situational BP to help inform physician Dog care Try to work ahead in the day, do a little something when thinking of it, like preload food dishes Try to knock out evening dog care in 1-2 sittings, not interrupt repeatedly and stretch it out May team up with Antonio [name] if willing for her to preload some feeding and health tasks so he can more grab and go -- at discretion.  Also may team up some amount of time in evening care routine, for the positive stimulus of sharing in the task Switch from yelling to using a clicker or other noisemaker Other recommendations/advice -- As may be noted above.  Continue to utilize previously learned skills ad lib. Medication compliance -- Maintain medication as prescribed and work faithfully with relevant prescriber(s) if any  changes are desired or seem indicated. Crisis service -- Aware of call list and work-in appts.  Call the clinic on-call service, 988/hotline, 911, or present to [facility] or ER if any life-threatening psychiatric crisis. Followup -- Return for time as already scheduled.  Next scheduled visit with me 08/31/2023.  Next scheduled in this office 08/03/2023.  Lamar Kendall, PhD Jodie Kendall, PhD LP Clinical Psychologist, [facility] [address], [address] [place], [place] [address] [phone]

## 2023-08-03 ENCOUNTER — Encounter: Payer: Self-pay | Admitting: Physician Assistant

## 2023-08-03 ENCOUNTER — Ambulatory Visit: Admitting: Physician Assistant

## 2023-08-03 DIAGNOSIS — F3341 Major depressive disorder, recurrent, in partial remission: Secondary | ICD-10-CM

## 2023-08-03 DIAGNOSIS — R413 Other amnesia: Secondary | ICD-10-CM

## 2023-08-03 DIAGNOSIS — R5381 Other malaise: Secondary | ICD-10-CM

## 2023-08-03 MED ORDER — ACETYLCYSTEINE 600 MG PO CAPS: 600.0000 mg | ORAL_CAPSULE | Freq: Two times a day (BID) | ORAL | Status: AC

## 2023-08-03 MED ORDER — CITALOPRAM HYDROBROMIDE 20 MG PO TABS: 20.0000 mg | ORAL_TABLET | Freq: Every day | ORAL | 1 refills | Status: DC

## 2023-08-03 NOTE — Progress Notes (Signed)
 [facility]  Patient ID: Antonio [name],  MRN: [id number]  PCP: Antonio Charm, MD  Date of Evaluation: 08/03/2023 Time spent:20 minutes  Chief Complaint:  Chief Complaint   Depression; Follow-up    HISTORY/CURRENT STATUS: For routine med check. Accompanied by wife Antonio [name].   Celexa  was added at LOV. He and Antonio [name] both states he's feeling much better. More energy and motivation. Not as irritable. Patient is able to enjoy things.  Wonders if increasing the Celexa  is an option.  I'd like to feel even better! No extreme sadness, tearfulness, or feelings of hopelessness.  Sleeps ok.  ADLs and personal hygiene are normal.   Denies any changes in concentration or making decisions.  Doesn't think as clearly as he once did.  Brain fog.  Can't think of words like he used to.  Appetite has not changed.  Weight is stable.  No mania, delirium, AH/VH.  No SI/HI.  Denies dizziness, syncope, seizures, numbness, tingling, tremor, tics, unsteady gait, slurred speech, confusion. Denies muscle or joint pain, stiffness, or dystonia.  Individual Medical History/ Review of Systems: Changes? :No     Past medications for mental health diagnoses include: Wellbutrin  SR, Wellbutrin  XL, Zoloft caused falls, Cymbalta , naltrexone  for post-COVID fog. Lamictal , Depakote , Trileptal  caused dizziness, fatigue, weakness.   Allergies: Metformin  and related, Wellbutrin  [bupropion ], and Zoloft [sertraline]  Current Medications:  Current Outpatient Medications:    acetaminophen  (TYLENOL ) 500 MG tablet, , Disp: , Rfl:    Acetylcysteine  600 MG CAPS, Take 1 capsule (600 mg total) by mouth 2 (two) times daily., Disp: , Rfl:    amLODipine (NORVASC) 5 MG tablet, Take 1 tablet by mouth daily., Disp: , Rfl:    b complex vitamins capsule, Take 1 capsule by mouth daily., Disp: , Rfl:    cholecalciferol  (VITAMIN D ) 1000 units tablet, Take 5,000 Units by mouth in the morning and at bedtime., Disp: , Rfl:     citalopram  (CELEXA ) 20 MG tablet, Take 1 tablet (20 mg total) by mouth daily., Disp: 90 tablet, Rfl: 1   ELDERBERRY PO, Take 2 each by mouth daily. Gummies, Disp: , Rfl:    ezetimibe (ZETIA) 10 MG tablet, Take 10 mg by mouth daily., Disp: , Rfl:    famotidine  (PEPCID ) 10 MG tablet, Take 10 mg by mouth daily. , Disp: , Rfl:    ferrous sulfate  325 (65 FE) MG tablet, Take 325 mg by mouth daily with breakfast., Disp: , Rfl:    JARDIANCE  10 MG TABS tablet, Take 10 mg by mouth daily., Disp: , Rfl:    potassium chloride  SA (K-DUR,KLOR-CON ) 20 MEQ tablet, Take 20 mEq by mouth daily. , Disp: , Rfl: 3   Turmeric Curcumin 500 MG CAPS, , Disp: , Rfl:    sildenafil (REVATIO) 20 MG tablet, , Disp: , Rfl:    UNABLE TO FIND, CPAP, Disp: , Rfl:  Medication Side Effects: none  Family Medical/ Social History: Changes? no  MENTAL HEALTH EXAM:  There were no vitals taken for this visit.There is no height or weight on file to calculate BMI.  General Appearance: Casual and Well Groomed  Eye Contact:  Good  Speech:  Clear and Coherent and Normal Rate  Volume:  Normal  Mood:  Euthymic  Affect:  Congruent  Thought Process:  Goal Directed and Descriptions of Associations: Circumstantial  Orientation:  Full (Time, Place, and Person)  Thought Content: Logical   Suicidal Thoughts:  No  Homicidal Thoughts:  No  Memory:  Immediate;  Fair Recent;   Fair Remote;   Good  Judgement:  Good  Insight:  Good  Psychomotor Activity:  Normal  Concentration:  Concentration: Good and Attention Span: Good  Recall:  Good  Fund of Knowledge: Good  Language: Good  Assets:  Communication Skills Desire for Improvement Financial Resources/Insurance Housing Transportation  ADL's:  Intact  Cognition: WNL  Prognosis:  Good   PCP follows labs  Mini-Mental    Flowsheet Row Office Visit from 03/19/2023 in [facility] Office Visit from 12/03/2020 in [facility]   Total Score (max 30 points ) 29 29   Flowsheet Row ED to Hosp-Admission (Discharged) from 03/29/2020 in [place] [facility] Pre-Admission Testing 60 from 03/03/2020 in [facility]  C-SSRS RISK CATEGORY No Risk Error: Question 6 not populated    DIAGNOSES:    ICD-10-CM   1. 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 Malaise and fatigue  R53.81    R53.83     3. Memory deficit  R41.3       Receiving Psychotherapy: Yes   Dr. Jodie Mitchum  RECOMMENDATIONS:  PDMP was reviewed. No controlled meds.  I provided approximately 20 minutes of face to face time during this encounter, including time spent before and after the visit in records review, medical decision making, counseling pertinent to today's visit, and charting.   I'm glad to see him doing better! It is appropriate to increase the Celexa .   Also discussed the brain foggyness, recommend adding NAC. Recent MRI of the brain showed changes in circulation (see neuro notes) so Namenda may be helpful. if NAC isn't effective will consider that.   Start NAC 600 mg bid.  Increase Celexa  to 20 mg daily. Continue vitamins as per med list. Continue CPAP use. Continue therapy with Dr. Jodie Kendall. Return in 6-8 weeks.   Verneita Cooks, PA-C

## 2023-08-31 ENCOUNTER — Ambulatory Visit: Payer: Medicare Other | Admitting: Psychiatry

## 2023-08-31 DIAGNOSIS — Z630 Problems in relationship with spouse or partner: Secondary | ICD-10-CM

## 2023-08-31 DIAGNOSIS — F3341 Major depressive disorder, recurrent, in partial remission: Secondary | ICD-10-CM

## 2023-08-31 DIAGNOSIS — F5104 Psychophysiologic insomnia: Secondary | ICD-10-CM

## 2023-08-31 NOTE — Progress Notes (Signed)
 Psychotherapy Progress Note [facility] Antonio Kendall, PhD LP  Patient ID: Antonio [name] Antonio [name])    MRN: [id number] Therapy format: Family therapy w/ patient -- accompanied by Antonio [name] Ask Date: 08/31/2023      Start: 3:16p     Stop: 4:03p     Time Spent: 47 min Location: In-person   Session narrative (presenting needs, interim history, self-report of stressors and symptoms, applications of prior therapy, status changes, and interventions made in session) Some relapse in irritability.  Says he's still regular on citalopram , but more often up to 1:30am, in bed till noon.  Not like he was, not 4-letter wording.  Reviewed usual recommendation to start the dogs earlier in evening so he can have winddown time, sleep more on time, and wake fresher.  For sleep encouragement, reviewed phone light settings and strategized about reducing stimulating features in the evening, like leaving [employer] on longer (which affects both of them).    For irritable moments, like today -- crabbing when he thought she asked a dumb question -- reaffirmed for [name] to try to exercise more self-control but also suggested to the couple that it could be more restorative for him to have a do over instead of both of them getting stuck in accuse and defend cycle.  Particularly suggested Ask could prompt him by asking him for a do over.  Both agreed can use that strategy.  New injury has [name] hobbled again, foot back in the boot for 6 weeks, after stubbing her toe.  She will be getting a nerve ablation in her neck as well, to control chronic pain.  Support provided.    Therapeutic modalities: Cognitive Behavioral Therapy, Solution-Oriented/Positive Psychology, Ego-Supportive, and Assertiveness/Communication  Mental Status/Observations:  Appearance:   Casual     Behavior:  Appropriate  Motor:  Normal  Speech/Language:   Clear and Coherent  Affect:  Appropriate  Mood:  Less improved, mild  irritability  Thought process:  normal  Thought content:    WNL  Sensory/Perceptual disturbances:    WNL  Orientation:  Fully oriented  Attention:  Good    Concentration:  Fair  Memory:  WNL  Insight:    Fair  Judgment:   Good  Impulse Control:  Fair   Risk Assessment: Danger to Self: No Self-injurious Behavior: No Danger to Others: No Physical Aggression / Violence: No Duty to Warn: No Access to Firearms a concern: No  Assessment of progress:  stabilized  Diagnosis:   ICD-10-CM   1. 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 Relationship problem between partners  Z63.0      Plan:  Communication/conflict/relationship Keep trying to get ahead of temper -- say you feel frustrated rather than just play it out.  Options to growl, time out, or something else first. Encourage use of do overs instead of confront and defend dynamics If able, practice saying I'm frustrated or ask what Ask means before reacting to what he believes he hears from her Continue general approach trying to be kinder on the first reaction, giving grace first with [name], don't make it have anything to do with how he feels like he's being treated For encouragement's sake, if desired, ask Ask if she can tell when he is trying Look for opportunities to establish a little quality time together, e.g., shared TV or music, with just touching lightly to get used to it  again, e.g., hold hands Pull out old favorite music and activities, including asking [name] to play cool jazz, to help with mood and possibly share [place] make priority of asking/getting [name]'s attention before expecting it -- he is older, sleep-disordered, and long in the habit of self-absorption, plus it's good form anyway.  Likewise try to keep instructions, commentaries, and complaints to few points at once -- he is more likely to react if feeling flooded.  Take into account Pt's  age, circadian rhythm problem, chronic difficulty treating sleep apnea, and hx of ischemic brain changes when interpreting problems with cognition, motivation, and emotional dyscontrol. Continue abstinence from addictive habits of knife-buying and cross-dressing.  Use other ways of self-soothing, preferably approach [name] for touch time and work on trusting her better to approach, in order to recruit normal sexual interest instead. Self-remind that cross-dressing is an old attempt to feel closer when couldn't; take the urge as a cue to get closer in reality now [name] encouraged to stop sleeping on the floor, return to the bed (if she can do it without feeling like she is selling out her own protest).  Will encourage also to improve her own sleep habit from 4 hr/night. Continue joint sessions as able and interested Physiological interventions for pain, fatigue, sleep, energy regulation, and other health issues Add omega 3 for added antiinflammatory benefit Try melatonin 1-2 hrs before sleep, 3-5mg .  At least by 10:30pm., and consent to go to bed earlier, say, by midnight. Carb curfew before bed, OK to snack earlier Work on sleep readiness by making sure stimulants out of the way well before bedtime.  Stop coffee by 3pm if at all possible.  Continue overall soda reduction Turn the lights down about 9pm, bring up light as early as possible Go on to bed by midnight, no matter what Get adequate activity/movement -- resume walking or stationary bike if it suits Practice 5-7 big, deep breaths morning and anytime need a refresher, on suspicion of underbreathing Kidney care, including reducing sodas and good fluid by day, hold down late evening fluids Address pain as needed Make sure CPAP is adjusted, clean, and maintained correctly.  Recheck need if desired. Enable earlier bed time by pressing through dog care tasks Maintain diabetic regimen Check actual expenses before assuming things are too  expensive Check situational BP to help inform physician Dog care Try to work ahead in the day, do a little something when thinking of it, like preload food dishes Try to knock out evening dog care in 1-2 sittings, not interrupt repeatedly and stretch it out May team up with [name] if willing for her to preload some feeding and health tasks so he can more grab and go -- at discretion.  Also may team up some amount of time in evening care routine, for the positive stimulus of sharing in the task Switch from yelling to using a clicker or other noisemaker Other recommendations/advice -- As may be noted above.  Continue to utilize previously learned skills ad lib. Medication compliance -- Maintain medication as prescribed and work faithfully with relevant prescriber(s) if any changes are desired or seem indicated. Crisis service -- Aware of call list and work-in appts.  Call the clinic on-call service, 988/hotline, 911, or present to [facility] or ER if any life-threatening psychiatric crisis. Followup -- Return for time as already scheduled.  Next scheduled visit with me 09/27/2023.  Next scheduled in this office 09/14/2023.  Lamar Kendall, PhD Antonio Kendall, PhD LP Clinical Psychologist, [facility] [address] [place], [place] [address] [phone]

## 2023-09-13 NOTE — Progress Notes (Incomplete)
 Psychotherapy Progress Note [facility] Antonio Kendall, PhD LP  Patient ID: SWADE SHONKA Antonio [name])    MRN: [id number] Therapy format: Family therapy w/ patient -- accompanied by Antonio [name] Ask Date: 08/31/2023      Start: 3:16p     Stop: 4:03p     Time Spent: 47 min Location: In-person   Session narrative (presenting needs, interim history, self-report of stressors and symptoms, applications of prior therapy, status changes, and interventions made in session) Some relapse in irritability.  Says he's still regular on citalopram , but more often up to 1:30am, in bed till noon.  Not like he was, not 4-letter wording.  Reviewed usual recommendation to start the dogs earlier in evening  For irriatble moments, like today -- crabbing when he thought she asked a dumb question -- suggested [place] ask him for a do over.    Ask has put her foot back in the boot for 6 weeks, stubbing the toe.  Support provided.  She will be getting a nerve ablation in her neck.    For sleep encouragement, reviewed phone light settings and strategized about reducing stimulating features in the evening, like keeping [employer] on longer.    Therapeutic modalities: {AM:23362::Cognitive Behavioral Therapy,Solution-Oriented/Positive Psychology}  Mental Status/Observations:  Appearance:   {PSY:22683}     Behavior:  {PSY:21022743}  Motor:  {PSY:22302}  Speech/Language:   {PSY:22685}  Affect:  {PSY:22687}  Mood:  {PSY:31886}  Thought process:  {PSY:31888}  Thought content:    {[phone]}  Sensory/Perceptual disturbances:    {PSY:[phone]}  Orientation:  {Psych Orientation:23301::Fully oriented}  Attention:  {Good-Fair-Poor ratings:23770::Good}    Concentration:  {Good-Fair-Poor ratings:23770::Good}  Memory:  {PSY:[id number]}  Insight:    {Good-Fair-Poor ratings:23770::Good}  Judgment:   {Good-Fair-Poor ratings:23770::Good}  Impulse Control:  {Good-Fair-Poor  ratings:23770::Good}   Risk Assessment: Danger to Self: {Risk:22599::No} Self-injurious Behavior: {Risk:22599::No} Danger to Others: {Risk:22599::No} Physical Aggression / Violence: {Risk:22599::No} Duty to Warn: {AMYesNo:22526::No} Access to Firearms a concern: {AMYesNo:22526::No}  Assessment of progress:  {Progress:22147::progressing}  Diagnosis: No diagnosis found. Plan:  *** Other recommendations/advice -- As may be noted above.  Continue to utilize previously learned skills ad lib. Medication compliance -- Maintain medication as prescribed and work faithfully with relevant prescriber(s) if any changes are desired or seem indicated. Crisis service -- Aware of call list and work-in appts.  Call the clinic on-call service, 988/hotline, 911, or present to [facility] or ER if any life-threatening psychiatric crisis. Followup -- No follow-ups on file.  Next scheduled visit with me 09/27/2023.  Next scheduled in this office 09/14/2023.  Lamar Kendall, PhD Antonio Kendall, PhD LP Clinical Psychologist, [facility] [address] [place], [place] [address] [phone]

## 2023-09-14 ENCOUNTER — Ambulatory Visit: Admitting: Physician Assistant

## 2023-09-27 ENCOUNTER — Ambulatory Visit: Admitting: Psychiatry

## 2023-09-27 DIAGNOSIS — Z9289 Personal history of other medical treatment: Secondary | ICD-10-CM | POA: Diagnosis not present

## 2023-09-27 DIAGNOSIS — F3341 Major depressive disorder, recurrent, in partial remission: Secondary | ICD-10-CM | POA: Diagnosis not present

## 2023-09-27 DIAGNOSIS — Z789 Other specified health status: Secondary | ICD-10-CM

## 2023-09-27 DIAGNOSIS — Z72821 Inadequate sleep hygiene: Secondary | ICD-10-CM

## 2023-09-27 DIAGNOSIS — F5104 Psychophysiologic insomnia: Secondary | ICD-10-CM | POA: Diagnosis not present

## 2023-09-27 DIAGNOSIS — Z630 Problems in relationship with spouse or partner: Secondary | ICD-10-CM | POA: Diagnosis not present

## 2023-09-27 NOTE — Progress Notes (Signed)
 Psychotherapy Progress Note [facility] Jodie Kendall, PhD LP  Patient ID: Antonio [name] Antonio [name])    MRN: [id number] Therapy format: Family therapy w/ patient -- accompanied by [name] Ask Date: 09/27/2023      Start: 3:18p     Stop: 4:08p     Time Spent: 50 min Location: In-person   Session narrative (presenting needs, interim history, self-report of stressors and symptoms, applications of prior therapy, status changes, and interventions made in session) Things have been trending more irritable again in [name]'s reactions, still not yelling, exactly, but complaining and defensiveness habits are back up after a honeymoon with Celexa .  On review, Pt credits persistent, uncomfortable sinus pressure, for which he has ENT visit on 9/10.  PCP found water  on his ear, he says.  No headache, just lightheadedness and a sense of pressure in temporal areas, with substantial nose running sometimes.  By [name]'s knowledge, bose-running could be an age-related neuro response (CSF drainage?) that is typically happening after meals, and she says with [name] it's prolific.  Was recommended to take Claritin but stopped it abruptly after a few days for saying it wasn't working.  Confronted abandoning treatment without consulting his doctor, educated on other possibilities, and commended to ENT, who will most likely be aware of [name]'s theory.  Meanwhile, continues in intractable habit of procrastinating tasks and staying up late, getting a second wind, then up till after 1am and sleeping late in the morning.  Faults the new hybrid mattress they got for feeling lumpy, while Ask says her mattress feels just fine.  Offered that they may naturally prefer different feels, and if it were to come to it, it could be more worth re-shopping a mattress than going through prolonged complaining.  Meanwhile, option to remove the pillow top or use plywood under the mattress (on a bunky board already) as experiments  in firmness.  Then, with a little more probing, discovered [name] has relapsed in evening caffeine, plenty often (coffee, [facility]) -- which is undoubtedly powering his second wind, despite predictable assertions that he's able to sleep.  Reminded that it acts long and surreptitiously, which he understood.  Advice, obviously, to curfew caffeine, recommended by 4pm.  As for the evening routine, encouraged yet again to get something done earlier with dog care, consider moving up the one dog's midnight medication he doggedly maintains has to be, following veterinary advice (while [name] just as routinely says is OK to move up as much as 2 hrs).    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  Therapeutic modalities: Cognitive Behavioral Therapy, Solution-Oriented/Positive Psychology, and Ego-Supportive  Mental Status/Observations:  Appearance:   Casual     Behavior:  Resistant and responsive  Motor:  stiff  Speech/Language:   Clear and Coherent  Affect:  Appropriate  Mood:  dysthymic  Thought process:  normal  Thought content:    excuses  Sensory/Perceptual disturbances:    WNL  Orientation:  Fully oriented  Attention:  Good    Concentration:  Good  Memory:  WNL  Insight:    Good  Judgment:   Good  Impulse Control:  Good   Risk Assessment: Danger to Self: No Self-injurious Behavior: No Danger to Others: No Physical Aggression / Violence: No Duty to Warn: No Access to Firearms a concern: No  Assessment of progress:  stabilized  Diagnosis:   ICD-10-CM   1. 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 Relationship problem between partners  Z63.0     4. Inadequate sleep hygiene  Z72.821     5. 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 Caffeine use, r/o disorder  Z78.9      Plan:  Communication/conflict/relationship Keep trying to get ahead of temper -- say you feel frustrated rather than just play it out.  Options to growl, time out, or something else first. Encourage use of do overs instead of confront and defend dynamics If able, practice saying I'm frustrated or ask what [name] means before reacting to what he believes he hears from her Continue general approach trying to be kinder on the first reaction, giving grace first with [name], don't make it have anything to do with how he feels like he's being treated For encouragement's sake, if desired, ask [name] if she can tell when he is trying Look for opportunities to establish a little quality time together, e.g., shared TV or music, with just touching lightly to get used to it again, e.g., hold hands Pull out old favorite music and activities, including asking [name] to play cool jazz, to help with mood and possibly share [place] make priority of asking/getting [name]'s attention before expecting it -- he is older, sleep-disordered, and long in the habit of self-absorption, plus it's good form anyway.  Likewise try to keep instructions, commentaries, and complaints to few points at once -- he is more likely to react if feeling flooded.  Take into account Pt's age, circadian rhythm problem, chronic difficulty treating sleep apnea, and hx of ischemic brain changes when interpreting problems with cognition, motivation, and emotional dyscontrol. Continue abstinence from addictive habits of knife-buying and cross-dressing.  Use other ways of self-soothing, preferably approach [name] for touch time and work on trusting her better to approach, in order to recruit normal sexual interest instead. Self-remind that cross-dressing is an old attempt to feel closer when couldn't; take the urge as a cue to get closer in reality now [name] encouraged to stop sleeping on the floor, return  to the bed (if she can do it without feeling like she is selling out her own protest).  Will encourage also to improve her own sleep habit from 4 hr/night. Continue joint sessions as able and interested Sleep hygiene and energy regulation Firm caffeine curfew 4pm Carb curfew before bed, OK to snack earlier Work on sleep readiness by making sure stimulants out of the way well before bedtime.  Stop coffee by 3pm if at all possible.  Continue overall soda reduction Turn the lights down about 9pm, bring up light as early as possible Go on to bed by midnight, no matter what Make sure CPAP is adjusted, clean, and maintained correctly.  Recheck need if desired. Enable earlier bed time by pressing through dog care tasks May try melatonin 1-2 hrs before sleep, 3-5mg .  At least by 10:30pm. Rise by 10am if at all possible Practice 5-7 big, deep breaths in the morning and anytime he needs a refresher, or suspects underbreathing Pain/orthopedic Add omega 3 for added antiinflammatory benefit Get adequate activity/movement -- resume walking or stationary bike if it suits Kidney care, including reducing sodas and good fluid by day, hold down late evening fluids Address pain as  needed Other health care Maintain diabetic regimen Follow through on ENT assessment to treat problem drainage and sinus congestion, r/o CSF and other neuro-related Check situational BP to help inform physician Check actual expenses before assuming things are too expensive and writing off medical recommendations Fact-check any of the above with physician Dog care workload Endorse large commitment to rescue work, but always OK to reconsider if it's too much Try to work ahead in the day/evening, do a little something when thinking of it, like preload food dishes Try to knock out evening dog care in 1-2 sittings, not interrupt repeatedly and stretch it out May always team up with [name] rather than complain, e.g., for her to preload  some feeding and health tasks so he can more grab and go -- at their discretion.  Also may team up some amount of time in evening care routine, which may go faster for teamwork and energizing him for the task. Switch from yelling to using a clicker or other noisemaker Other recommendations/advice -- As may be noted above.  Continue to utilize previously learned skills ad lib. Medication compliance -- Maintain medication as prescribed and work faithfully with relevant prescriber(s) if any changes are desired or seem indicated. Crisis service -- Aware of call list and work-in appts.  Call the clinic on-call service, 988/hotline, 911, or present to [facility] or ER if any life-threatening psychiatric crisis. Followup -- Return for time as already scheduled, avail earlier @ PT's need.  Next scheduled visit with me 12/07/2023.  Next scheduled in this office 10/19/2023.  Lamar Kendall, PhD Jodie Kendall, PhD LP Clinical Psychologist, [facility] [address] [place], [place] [address] [phone]

## 2023-10-03 ENCOUNTER — Ambulatory Visit (INDEPENDENT_AMBULATORY_CARE_PROVIDER_SITE_OTHER): Admitting: Otolaryngology

## 2023-10-03 VITALS — BP 167/71 | HR 85

## 2023-10-03 DIAGNOSIS — J342 Deviated nasal septum: Secondary | ICD-10-CM

## 2023-10-03 DIAGNOSIS — R0981 Nasal congestion: Secondary | ICD-10-CM | POA: Diagnosis not present

## 2023-10-03 DIAGNOSIS — J310 Chronic rhinitis: Secondary | ICD-10-CM | POA: Diagnosis not present

## 2023-10-03 DIAGNOSIS — H9193 Unspecified hearing loss, bilateral: Secondary | ICD-10-CM | POA: Diagnosis not present

## 2023-10-03 DIAGNOSIS — H903 Sensorineural hearing loss, bilateral: Secondary | ICD-10-CM

## 2023-10-03 DIAGNOSIS — J343 Hypertrophy of nasal turbinates: Secondary | ICD-10-CM

## 2023-10-03 DIAGNOSIS — H9313 Tinnitus, bilateral: Secondary | ICD-10-CM | POA: Diagnosis not present

## 2023-10-03 DIAGNOSIS — R42 Dizziness and giddiness: Secondary | ICD-10-CM

## 2023-10-03 MED ORDER — FLUTICASONE PROPIONATE 50 MCG/ACT NA SUSP: 2.0000 | Freq: Every day | NASAL | 10 refills | Status: AC

## 2023-10-05 DIAGNOSIS — H9313 Tinnitus, bilateral: Secondary | ICD-10-CM | POA: Insufficient documentation

## 2023-10-05 DIAGNOSIS — J342 Deviated nasal septum: Secondary | ICD-10-CM | POA: Insufficient documentation

## 2023-10-05 DIAGNOSIS — R42 Dizziness and giddiness: Secondary | ICD-10-CM | POA: Insufficient documentation

## 2023-10-05 DIAGNOSIS — J310 Chronic rhinitis: Secondary | ICD-10-CM | POA: Insufficient documentation

## 2023-10-05 DIAGNOSIS — J343 Hypertrophy of nasal turbinates: Secondary | ICD-10-CM | POA: Insufficient documentation

## 2023-10-05 NOTE — Progress Notes (Signed)
 CC: Chronic dizziness, chronic rhinosinusitis, nasal congestion  HPI:  Antonio [name] is a 83 y.o. male who presents today complaining of chronic dizziness for the past year.  He describes the dizziness as a lightheadedness sensation.  He denies any spinning vertigo.  He also has chronic nasal congestion and chronic rhinosinusitis.  He complains of frequent nasal drainage.  He is currently on Claritin.  He also has a history of bilateral hearing loss and tinnitus.  He wears bilateral hearing aids.  He underwent adenotonsillectomy as a child.  Currently he denies any fever, facial pain, or visual change.  His MRI scan in April 2025 was negative for retrocochlear lesion.  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   Chronic back pain    Depression    Diabetes mellitus without complication (HCC)    not on meds    DNR (do not resuscitate) 03/30/2020   GERD (gastroesophageal reflux disease)    Heart murmur    History of carpal tunnel syndrome    Bilateral   Hyperlipidemia    borderline   Hypertension    Leg edema, right    OSA on CPAP    RBBB 01/11/2016   Noted on EKG    Past Surgical History:  Procedure Laterality Date   CARPAL TUNNEL RELEASE Bilateral    CHOLECYSTECTOMY N/A 03/31/2020   Procedure: LAPAROSCOPIC CHOLECYSTECTOMY WITH INTRAOPERATIVE CHOLANGIOGRAM, TAP BLOCK;  Surgeon: Gladis Cough, MD;  Location: [facility];  Service: General;  Laterality: N/A;   INGUINAL HERNIA REPAIR N/A 03/11/2020   Procedure: LAPAROSCOPIC BILATERAL INGUINAL HERNIA REPAIR WITH MESH, TAP BLOCK;  Surgeon: Sheldon Standing, MD;  Location: [facility];  Service: General;  Laterality: N/A;   LUMBAR LAMINECTOMY/DECOMPRESSION MICRODISCECTOMY  09/06/2011   Procedure: LUMBAR LAMINECTOMY/DECOMPRESSION MICRODISCECTOMY 1 LEVEL;  Surgeon:  Lamar [name] Peaches, MD;  Location: [facility];  Service: Neurosurgery;  Laterality: Left;  Left lumbar three-four Lumbar Laminotomy/microdiskectomy   LUMBAR LAMINECTOMY/DECOMPRESSION MICRODISCECTOMY Left 12/26/2012   Procedure: LUMBAR LAMINECTOMY/DECOMPRESSION MICRODISCECTOMY LEFT LUMBAR THREE-FOUR ;  Surgeon: Lamar [name] Peaches, MD;  Location: [facility];  Service: Neurosurgery;  Laterality: Left;   POLYPECTOMY     removed small intestine   REVERSE SHOULDER ARTHROPLASTY Left 07/11/2019   Procedure: REVERSE SHOULDER ARTHROPLASTY;  Surgeon: Kay Kemps, MD;  Location: [facility];  Service: Orthopedics;  Laterality: Left;  interscalene block   ROTATOR CUFF REPAIR Left    TONSILLECTOMY      Family History  Problem Relation Age of Onset   Colon cancer Mother    COPD Father    Diabetes Mellitus I Brother    Hypertension Daughter     Social History:  reports that he has never smoked. He has never used smokeless tobacco. He reports that he does not currently use alcohol. He reports that he does not use drugs.  Allergies:  Allergies  Allergen Reactions   Metformin  And Related Diarrhea   Wellbutrin  [Bupropion ] Other (See Comments)    Mental fogginess per patient   Zoloft [Sertraline] Other (See Comments)    Mental fogginess per patient    Prior to Admission medications   Medication Sig Start Date End Date Taking? Authorizing Provider  acetaminophen  (TYLENOL ) 500 MG tablet    Yes [provider]  Acetylcysteine  600  MG CAPS Take 1 capsule (600 mg total) by mouth 2 (two) times daily. 08/03/23  Yes Rhys Boyer T, PA-C  amLODipine (NORVASC) 5 MG tablet Take 1 tablet by mouth daily. 04/30/20  Yes [provider]  b complex vitamins capsule Take 1 capsule by mouth daily.   Yes [provider]  cholecalciferol  (VITAMIN D ) 1000 units tablet Take 5,000 Units by mouth in the morning and at bedtime.   Yes [provider]  citalopram  (CELEXA ) 20 MG tablet Take 1  tablet (20 mg total) by mouth daily. 08/03/23  Yes Hurst, Teresa T, PA-C  ELDERBERRY PO Take 2 each by mouth daily. Gummies   Yes [provider]  ezetimibe (ZETIA) 10 MG tablet Take 10 mg by mouth daily. 07/06/23  Yes [provider]  famotidine  (PEPCID ) 10 MG tablet Take 10 mg by mouth daily.    Yes [provider]  ferrous sulfate  325 (65 FE) MG tablet Take 325 mg by mouth daily with breakfast.   Yes [provider]  fluticasone  (FLONASE ) 50 MCG/ACT nasal spray Place 2 sprays into both nostrils daily. 10/03/23 11/02/23 Yes Karis Clunes, MD  JARDIANCE  10 MG TABS tablet Take 10 mg by mouth daily. 11/21/21  Yes [provider]  potassium chloride  SA (K-DUR,KLOR-CON ) 20 MEQ tablet Take 20 mEq by mouth daily.  07/30/17  Yes [provider]  Turmeric Curcumin 500 MG CAPS    Yes [provider]  UNABLE TO FIND CPAP   Yes [provider]  sildenafil (REVATIO) 20 MG tablet  01/27/13   [provider]    Blood pressure (!) 167/71, pulse 85, SpO2 96%. Exam: General: Communicates without difficulty, well nourished, no acute distress. Head: Normocephalic, no evidence injury, no tenderness, facial buttresses intact without stepoff. Face/sinus: No tenderness to palpation and percussion. Facial movement is normal and symmetric. Eyes: PERRL, EOMI. No scleral icterus, conjunctivae clear. Neuro: CN II exam reveals vision grossly intact.  No nystagmus at any point of gaze. Ears: Auricles well formed without lesions.  Ear canals are intact without mass or lesion.  No erythema or edema is appreciated.  The TMs are intact without fluid. Nose: External evaluation reveals normal support and skin without lesions.  Dorsum is intact.  Anterior rhinoscopy reveals congested mucosa over anterior aspect of inferior turbinates and intact septum.  No purulence noted. Oral:  Oral cavity and oropharynx are intact, symmetric, without erythema or edema.  Mucosa is  moist without lesions. Neck: Full range of motion without pain.  There is no significant lymphadenopathy.  No masses palpable.  Thyroid  bed within normal limits to palpation.  Parotid glands and submandibular glands equal bilaterally without mass.  Trachea is midline. Neuro:  CN 2-12 grossly intact. Vestibular: No nystagmus at any point of gaze. Dix Hallpike negative.  There is no nystagmus with pneumatic pressure on either tympanic membrane or Valsalva. The cerebellar examination is unremarkable.   Procedure:  Flexible Nasal Endoscopy: Description: Risks, benefits, and alternatives of flexible endoscopy were explained to the patient.  Specific mention was made of the risk of throat numbness with difficulty swallowing, possible bleeding from the nose and mouth, and pain from the procedure.  The patient gave oral consent to proceed.  The flexible scope was inserted into the right nasal cavity.  Endoscopy of the interior nasal cavity, superior, inferior, and middle meatus was performed. The sphenoid-ethmoid recess was examined. Edematous mucosa was noted.  No polyp, mass, or lesion was appreciated. Nasal septal deviation  noted. Olfactory cleft was clear.  Nasopharynx was clear.  Turbinates were hypertrophied but without mass.  The procedure was repeated on the contralateral side with similar findings.  The patient tolerated the procedure well.    Assessment: 1.  Chronic rhinitis with nasal mucosal congestion, nasal septal deviation, and bilateral inferior turbinate hypertrophy. 2.  No acute infection is noted today. 3.  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 4.  Recurrent dizziness of unknown etiology. The possible differential diagnoses include transient BPPV, vestibular migraine, Meniere's disease, peripheral vestibular dysfunction, or other central/systemic causes.    Plan: 1.  The physical exam and nasal endoscopy findings are reviewed with the patient. 2.   Flonase  nasal spray 2 sprays each nostril daily.  The importance of consistent daily use is discussed. 3.  The patient is reassured that no acute infection is noted today.  Nasal saline irrigation is encouraged. 4.  The pathophysiology of vestibular dysfunction and dizziness are discussed extensively with the patient. The possible differential diagnoses are reviewed. Questions are invited and answered.   5.  Continue the use of his hearing aids. 6.  The patient will return for reevaluation in 2 months, sooner if needed.  Venezia Sargeant W Rosalita Carey 10/05/2023, 7:35 PM

## 2023-10-09 ENCOUNTER — Encounter: Payer: Self-pay | Admitting: Physician Assistant

## 2023-10-09 ENCOUNTER — Ambulatory Visit: Admitting: Physician Assistant

## 2023-10-09 DIAGNOSIS — G9332 Myalgic encephalomyelitis/chronic fatigue syndrome: Secondary | ICD-10-CM | POA: Diagnosis not present

## 2023-10-09 DIAGNOSIS — F3341 Major depressive disorder, recurrent, in partial remission: Secondary | ICD-10-CM

## 2023-10-09 DIAGNOSIS — U099 Post covid-19 condition, unspecified: Secondary | ICD-10-CM

## 2023-10-09 DIAGNOSIS — R4189 Other symptoms and signs involving cognitive functions and awareness: Secondary | ICD-10-CM | POA: Diagnosis not present

## 2023-10-09 NOTE — Progress Notes (Signed)
 [facility]  Patient ID: Antonio [name],  MRN: [id number]  PCP: Elliot Charm, MD  Date of Evaluation: 10/09/2023 Time spent:20 minutes  Chief Complaint:  Chief Complaint   Depression; Follow-up    HISTORY/CURRENT STATUS: For routine med check.   Mood is better since increasing the Celexa  at LOV.   No extreme sadness, tearfulness, or feelings of hopelessness.  Is sad about the assassination of Bebe Mulders.  Brain fog is still there, even with NAC for a month.  Sleeps well most of the time. ADLs and personal hygiene are normal.   Denies any changes in concentration, making decisions, or remembering things.  Appetite has not changed.  Weight is stable.   No mania, delirium, AH/VH.  No SI/HI.  Individual Medical History/ Review of Systems: Changes? :Yes  saw ENT, started on Flonase .  Past medications for mental health diagnoses include: Wellbutrin  SR, Wellbutrin  XL, Zoloft caused falls, Cymbalta , naltrexone  for post-COVID fog. Lamictal , Depakote , Trileptal  caused dizziness, fatigue, weakness.   Allergies: Metformin  and related, Wellbutrin  [bupropion ], and Zoloft [sertraline]  Current Medications:  Current Outpatient Medications:    acetaminophen  (TYLENOL ) 500 MG tablet, , Disp: , Rfl:    Acetylcysteine  600 MG CAPS, Take 1 capsule (600 mg total) by mouth 2 (two) times daily., Disp: , Rfl:    amLODipine (NORVASC) 5 MG tablet, Take 1 tablet by mouth daily., Disp: , Rfl:    b complex vitamins capsule, Take 1 capsule by mouth daily., Disp: , Rfl:    cholecalciferol  (VITAMIN D ) 1000 units tablet, Take 5,000 Units by mouth in the morning and at bedtime., Disp: , Rfl:    citalopram  (CELEXA ) 20 MG tablet, Take 1 tablet (20 mg total) by mouth daily., Disp: 90 tablet, Rfl: 1   ELDERBERRY PO, Take 2 each by mouth daily. Gummies, Disp: , Rfl:    ezetimibe (ZETIA) 10 MG tablet, Take 10 mg by mouth daily., Disp: , Rfl:    famotidine  (PEPCID ) 10 MG tablet, Take 10 mg by  mouth daily. , Disp: , Rfl:    ferrous sulfate  325 (65 FE) MG tablet, Take 325 mg by mouth daily with breakfast., Disp: , Rfl:    fluticasone  (FLONASE ) 50 MCG/ACT nasal spray, Place 2 sprays into both nostrils daily., Disp: 16 g, Rfl: 10   JARDIANCE  10 MG TABS tablet, Take 10 mg by mouth daily., Disp: , Rfl:    potassium chloride  SA (K-DUR,KLOR-CON ) 20 MEQ tablet, Take 20 mEq by mouth daily. , Disp: , Rfl: 3   Turmeric Curcumin 500 MG CAPS, , Disp: , Rfl:    UNABLE TO FIND, CPAP, Disp: , Rfl:    sildenafil (REVATIO) 20 MG tablet, , Disp: , Rfl:  Medication Side Effects: none  Family Medical/ Social History: Changes? no  MENTAL HEALTH EXAM:  There were no vitals taken for this visit.There is no height or weight on file to calculate BMI.  General Appearance: Casual and Well Groomed  Eye Contact:  Good  Speech:  Clear and Coherent and Normal Rate  Volume:  Normal  Mood:  Euthymic  Affect:  Congruent  Thought Process:  Goal Directed and Descriptions of Associations: Circumstantial  Orientation:  Full (Time, Place, and Person)  Thought Content: Logical   Suicidal Thoughts:  No  Homicidal Thoughts:  No  Memory:  Immediate;   Fair Recent;   Fair Remote;   Good  Judgement:  Good  Insight:  Good  Psychomotor Activity:  Normal  Concentration:  Concentration: Good and Attention  Span: Good  Recall:  Good  Fund of Knowledge: Good  Language: Good  Assets:  Communication Skills Desire for Improvement Financial Resources/Insurance Housing Resilience Transportation  ADL's:  Intact  Cognition: WNL  Prognosis:  Good   PCP follows labs  Mini-Mental    Flowsheet Row Office Visit from 03/19/2023 in [facility] Office Visit from 12/03/2020 in [facility]  Total Score (max 30 points ) 29 29   Flowsheet Row ED to Hosp-Admission (Discharged) from 03/29/2020 in [facility] Pre-Admission Testing 60 from 03/03/2020 in [facility]  C-SSRS RISK CATEGORY No Risk Error: Question 6 not populated    DIAGNOSES:    ICD-10-CM   1. 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 Brain fog  R41.89       Receiving Psychotherapy: Yes   Dr. Jodie Mitchum  RECOMMENDATIONS:  PDMP was reviewed. No controlled meds.  I provided approximately 20 minutes of face to face time during this encounter, including time spent before and after the visit in records review, medical decision making, counseling pertinent to today's visit, and charting.   Disc brain fog.  Recommend he take NAC for another 2-3 months. If it doesn't help will d/c.  Continue NAC 600 mg bid.  Continue Celexa   20 mg daily. Continue vitamins as per med list. Continue CPAP use. Continue therapy with Dr. Jodie Kendall. Return in 3 months.  Verneita Cooks, PA-C

## 2023-10-19 ENCOUNTER — Ambulatory Visit: Admitting: Physician Assistant

## 2023-12-05 ENCOUNTER — Encounter (INDEPENDENT_AMBULATORY_CARE_PROVIDER_SITE_OTHER): Payer: Self-pay | Admitting: Otolaryngology

## 2023-12-05 ENCOUNTER — Ambulatory Visit (INDEPENDENT_AMBULATORY_CARE_PROVIDER_SITE_OTHER): Admitting: Otolaryngology

## 2023-12-05 VITALS — BP 137/71 | HR 76 | Ht 65.0 in | Wt 145.0 lb

## 2023-12-05 DIAGNOSIS — J342 Deviated nasal septum: Secondary | ICD-10-CM | POA: Diagnosis not present

## 2023-12-05 DIAGNOSIS — J310 Chronic rhinitis: Secondary | ICD-10-CM | POA: Diagnosis not present

## 2023-12-05 DIAGNOSIS — H903 Sensorineural hearing loss, bilateral: Secondary | ICD-10-CM

## 2023-12-05 DIAGNOSIS — R42 Dizziness and giddiness: Secondary | ICD-10-CM | POA: Diagnosis not present

## 2023-12-05 DIAGNOSIS — H9313 Tinnitus, bilateral: Secondary | ICD-10-CM | POA: Diagnosis not present

## 2023-12-05 DIAGNOSIS — J343 Hypertrophy of nasal turbinates: Secondary | ICD-10-CM

## 2023-12-06 NOTE — Progress Notes (Signed)
 Patient ID: Antonio [name], male   DOB: 1940/02/10, 83 y.o.   MRN: [id number]  Follow-up: Chronic dizziness, chronic nasal congestion, recurrent sinusitis, hearing loss, tinnitus  HPI: The patient is an 83 year old male who returns today for his follow-up evaluation.  The patient was previously seen for chronic dizziness, chronic nasal congestion, and recurrent sinusitis.  He also has a history of bilateral sensorineural hearing loss and tinnitus.  He wears bilateral hearing aids.  The patient was treated with Flonase  nasal spray and nasal saline irrigation.  The patient returns today complaining of persistent nasal congestion.  He had 1 episode of sinusitis last month.  He was treated with oral antibiotic and Mucinex .  In addition, he also complains of persistent dizziness.  He describes his dizziness as an off-balance and lightheaded sensation.  He denies any spinning vertigo.  He denies any recent change in his hearing or tinnitus.  Exam: General: Communicates without difficulty, well nourished, no acute distress. Head: Normocephalic, no evidence injury, no tenderness, facial buttresses intact without stepoff. Face/sinus: No tenderness to palpation and percussion. Facial movement is normal and symmetric. Eyes: PERRL, EOMI. No scleral icterus, conjunctivae clear. Neuro: CN II exam reveals vision grossly intact.  No nystagmus at any point of gaze. Ears: Auricles well formed without lesions.  Ear canals are intact without mass or lesion.  No erythema or edema is appreciated.  The TMs are intact without fluid. Nose: External evaluation reveals normal support and skin without lesions.  Dorsum is intact.  Anterior rhinoscopy reveals congested mucosa over anterior aspect of inferior turbinates and deviated septum.  No purulence noted. Oral:  Oral cavity and oropharynx are intact, symmetric, without erythema or edema.  Mucosa is moist without lesions. Neck: Full range of motion without pain.  There is no  significant lymphadenopathy.  No masses palpable.  Thyroid  bed within normal limits to palpation.  Parotid glands and submandibular glands equal bilaterally without mass.  Trachea is midline. Neuro:  CN 2-12 grossly intact. Vestibular: No nystagmus at any point of gaze. Dix Hallpike negative. Vestibular: There is no nystagmus with pneumatic pressure on either tympanic membrane or Valsalva. The cerebellar examination is unremarkable.   Procedure:  Flexible Nasal Endoscopy: Description: Risks, benefits, and alternatives of flexible endoscopy were explained to the patient.  Specific mention was made of the risk of throat numbness with difficulty swallowing, possible bleeding from the nose and mouth, and pain from the procedure.  The patient gave oral consent to proceed.  The flexible scope was inserted into the right nasal cavity.  Endoscopy of the interior nasal cavity, superior, inferior, and middle meatus was performed. The sphenoid-ethmoid recess was examined. Edematous mucosa was noted.  No polyp, mass, or lesion was appreciated. Nasal septal deviation noted. Olfactory cleft was clear.  Nasopharynx was clear.  Turbinates were hypertrophied but without mass.  The procedure was repeated on the contralateral side with similar findings.  The patient tolerated the procedure well.   Assessment: 1.  Recurrent dizziness of unknown etiology. The possible differential diagnoses include transient BPPV, vestibular migraine, Meniere's disease, peripheral vestibular dysfunction, or other central/systemic causes.  His ear canals, tympanic membranes, and middle ear spaces are normal.  No middle ear effusion or infection is noted.  His Dix-Hallpike maneuver was negative. 2.  Chronic rhinitis with nasal mucosal congestion, nasal septal deviation, and bilateral inferior turbinate hypertrophy. 3.  His recent acute sinusitis has resolved.  No purulent drainage is noted today. 4.  Subjectively stable bilateral sensorineural  hearing  loss and tinnitus.  Plan: 1.  The physical exam and nasal endoscopy findings are reviewed with the patient. 2.  The patient is reassured that his acute sinusitis has resolved. 3.  Continue Flonase  nasal spray 2 sprays each nostril daily. 4.  The pathophysiology of dizziness and vestibular dysfunction are discussed with the patient.  Questions are invited and answered. 5.  The patient will likely benefit from undergoing physical therapy/vestibular rehabilitation to improve the balancing function. A referral will be arranged as soon as possible.  6.  If the patient continues to be symptomatic, he may benefit from vestibular neurodiagnostic testing at a tertiary care center to evaluate for possible vestibular dysfunction.

## 2023-12-07 ENCOUNTER — Ambulatory Visit: Admitting: Psychiatry

## 2023-12-07 DIAGNOSIS — U099 Post covid-19 condition, unspecified: Secondary | ICD-10-CM

## 2023-12-07 DIAGNOSIS — F3341 Major depressive disorder, recurrent, in partial remission: Secondary | ICD-10-CM | POA: Diagnosis not present

## 2023-12-07 DIAGNOSIS — Z630 Problems in relationship with spouse or partner: Secondary | ICD-10-CM

## 2023-12-07 DIAGNOSIS — G9332 Myalgic encephalomyelitis/chronic fatigue syndrome: Secondary | ICD-10-CM

## 2023-12-07 DIAGNOSIS — F5104 Psychophysiologic insomnia: Secondary | ICD-10-CM

## 2023-12-07 DIAGNOSIS — R4189 Other symptoms and signs involving cognitive functions and awareness: Secondary | ICD-10-CM

## 2023-12-07 DIAGNOSIS — G4733 Obstructive sleep apnea (adult) (pediatric): Secondary | ICD-10-CM

## 2023-12-07 NOTE — Patient Instructions (Addendum)
 Key points today: Do anything you can to get caffeine out of the evening, so you can get tired on time. Do anything you can to get dog care started earlier, so there's no have to stay up working on you. Permission for [place] to open the blinds in the bedroom 9am or earlier, so light can help you bring your body clock back to Eastern time.  (Trust it!  It will help!) NAC -- Get at least 2 of them, OK to take at the same time.  It may take 3 to really break through brain fog, but let's see about 2 until you see Verneita in December. B complex -- only take in the morning; B12 will keep you up Communication -- Absolute permission for [name] to say Stop it if it's harsh.  If it won't respond to Stop it, OK to pull over or take a walk. I would still recommend confirming that CPAP is working well enough -- ask the sleep specialist, but also share with the sleep specialist that we are working on sleep hygiene. Optional whether you have the ENT work out sinus problems (deviated septum and inferior turbinates), but they do affect breathing some, and MAY be enough to affect sleep. I would definitely recommend trying out vestibular therapy for dizziness (inner ear PT).  And use Flonase  or Claritin as recommended.

## 2023-12-07 NOTE — Progress Notes (Signed)
 Psychotherapy Progress Note [facility] Jodie Kendall, PhD LP  Patient ID: Antonio [name] Antonio [name])    MRN: [id number] Therapy format: Family therapy w/ patient -- accompanied by [name] Ask Date: 12/07/2023      Start: 10:15a     Stop: 11:02a     Time Spent: 47 min Location: In-person   Session narrative (presenting needs, interim history, self-report of stressors and symptoms, applications of prior therapy, status changes, and interventions made in session) C. 10 weeks since last seen.  Last indication form psychiatry that increased Celexa  helpful.  NAC recommended for 2-3 months for brain fog.  Last therapy encouraged again to move up dog care routine so he can turn in earlier and have time to enjoy before bed, curfew caffeine by 4pm, and try to use the Claritin he's been recommended for allergic drainage.    Today, Ask says irritability somewhat down, at least as far as yelling is concerned, but he's still prone to be sarcastic with her.  Sleeping till 11 or noon.  Revealed he's only taking 1 NAC, having assumed he's supposed to have 1.  Reminded to take 2 and encouraged it will be more benefit once he does, could possibly go to 3, depending.  CPAP is regular and no issues, according to his sleep specialist, though this may need clarifying.  Addressed the lumpy mattress cover, which was just the cover, fine now.  NMs 1-2/mo, not remembered, where Ask catches him calling out in sleep.  Considered inconsequential.  Saw his ENT, who said everything is fine, even though he does have these complaints, he is coming off a sinus infection, and he reports here anosmia about 6 months, brain fog for 5 years.  Still getting [facility] late in the day, e.g., 10pm, so strongly encouraged to decaffeinate from 4pm on, whichever way he can do that.  Admits he sleeps in in a darkened room in the morning.  Authorized [name] to pull the blinds as early as 8am if she wants to provide  stimulus to wake up and move his internal clock forward.  Further complaints from [name] about stubbornness and naysaying, though he did go with her to her 2-week November beach trip and enjoy the time.  Encouraged again any kind of quality time together, and any kind of getting ahead on dog care, so he has a prayer of backing up bedtime and wake time.  Therapeutic modalities: Cognitive Behavioral Therapy, Solution-Oriented/Positive Psychology, Ego-Supportive, and Psycho-education/Bibliotherapy  Mental Status/Observations:  Appearance:   Casual     Behavior:  Appropriate  Motor:  Normal  Speech/Language:   Clear and Coherent  Affect:  Appropriate  Mood:  dysthymic, lighter  Thought process:  concrete and more open  Thought content:    WNL  Sensory/Perceptual disturbances:    WNL exc anosmia  Orientation:  Fully oriented  Attention:  Good    Concentration:  Fair  Memory:  WNL  Insight:    Fair  Judgment:   Good  Impulse Control:  Fair   Risk Assessment: Danger to Self: No Self-injurious Behavior: No Danger to Others: No Physical Aggression / Violence: No Duty to Warn: No Access to Firearms a concern: No  Assessment of progress:  stabilized  Diagnosis:   ICD-10-CM   1. 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 Brain fog  R41.89     5. Relationship problem between partners  Z63.0     6. OSA on CPAP  G47.33      Plan:  Sleep hygiene and energy regulation Firm caffeine curfew 4pm -- all forms, reliably Carb curfew before bed, OK to snack earlier Turn the lights down about 9pm, bring up light as early in the morning as possible Go on to bed by midnight, no matter what Rise by 10am if at all possible Make sure CPAP is adjusted, clean, and maintained correctly.  Recheck need if desired. Enable earlier bed time by pressing through dog care  tasks May try melatonin 1-2 hrs before sleep, 3-5mg .  At least by 10:30pm. Practice 5-7 big, deep breaths in the morning and anytime he needs a refresher, or suspects underbreathing Communication/conflict/relationship Keep trying to get ahead of temper -- say you feel frustrated rather than just play it out.  Options to growl, time out, or something else first. Encourage use of do overs instead of confront and defend dynamics If able, practice saying I'm frustrated or ask what [name] means before reacting to what he believes he hears from her Continue general approach trying to be kinder on the first reaction, giving grace first with [name], don't make it have anything to do with how he feels like he's being treated For encouragement's sake, if desired, ask [name] if she can tell when he is trying Look for opportunities to establish a little quality time together, e.g., shared TV or music, with just touching lightly to get used to it again, e.g., hold hands Pull out old favorite music and activities, including asking [name] to play cool jazz, to help with mood and possibly share [name] make priority of asking/getting [name]'s attention before expecting it -- he is older, sleep-disordered, and long in the habit of self-absorption, plus it's good form anyway.  Likewise try to keep instructions, commentaries, and complaints to few points at once -- he is more likely to react if feeling flooded.  Take into account Pt's age, circadian rhythm problem, chronic difficulty treating sleep apnea, and hx of ischemic brain changes when interpreting problems with cognition, motivation, and emotional dyscontrol. Continue abstinence from addictive habits of knife-buying and cross-dressing.  Use other ways of self-soothing, preferably approach [name] for touch time and work on trusting her better to approach, in order to recruit normal sexual interest instead. Self-remind that cross-dressing is an old attempt to feel closer  when couldn't; take the urge as a cue to get closer in reality now [name] encouraged to stop sleeping on the floor, return to the bed (if she can do it without feeling like she is selling out her own protest).  Will encourage also to improve her own sleep habit from 4 hr/night. Continue joint sessions as able and interested Pain/orthopedic Add omega 3 for added antiinflammatory benefit Get adequate activity/movement -- resume walking or stationary bike if it suits Kidney care, including reducing sodas and good fluid by day, hold down late evening fluids Address pain as needed Other health care Maintain diabetic regimen Follow through on ENT assessment to treat problem drainage and sinus congestion, r/o CSF and other neuro-related Check situational BP to help inform physician Check actual expenses before assuming things are too expensive and writing off medical recommendations Fact-check any of the above with physician Dog care workload Endorse large commitment to rescue work, but always OK to reconsider if it's too much Try to work ahead in the  day/evening, do a little something when thinking of it, like preload food dishes Try to knock out evening dog care in 1-2 sittings, not interrupt repeatedly and stretch it out May always team up with [facility] rather than complain, e.g., for her to preload some feeding and health tasks so he can more grab and go -- at their discretion.  Also may team up some amount of time in evening care routine, which may go faster for teamwork and energizing him for the task. Switch from yelling to using a clicker or other noisemaker Other recommendations/advice -- As may be noted above.  Continue to utilize previously learned skills ad lib. Medication compliance -- Maintain medication as prescribed and work faithfully with relevant prescriber(s) if any changes are desired or seem indicated. Crisis service -- Aware of call list and work-in appts.  Call the clinic on-call  service, 988/hotline, 911, or present to [facility] or ER if any life-threatening psychiatric crisis. Followup -- Return for time as already scheduled.  Next scheduled visit with me 01/10/2024.  Next scheduled in this office 01/07/2024.  Lamar Kendall, PhD Jodie Kendall, PhD LP Clinical Psychologist, [facility] [address] [place], [place] [address] [phone]

## 2024-01-07 ENCOUNTER — Ambulatory Visit: Attending: Otolaryngology

## 2024-01-07 ENCOUNTER — Ambulatory Visit: Admitting: Physician Assistant

## 2024-01-07 ENCOUNTER — Encounter: Payer: Self-pay | Admitting: Physician Assistant

## 2024-01-07 ENCOUNTER — Other Ambulatory Visit: Payer: Self-pay

## 2024-01-07 DIAGNOSIS — G4733 Obstructive sleep apnea (adult) (pediatric): Secondary | ICD-10-CM

## 2024-01-07 DIAGNOSIS — R2681 Unsteadiness on feet: Secondary | ICD-10-CM

## 2024-01-07 DIAGNOSIS — F3341 Major depressive disorder, recurrent, in partial remission: Secondary | ICD-10-CM | POA: Diagnosis not present

## 2024-01-07 DIAGNOSIS — R42 Dizziness and giddiness: Secondary | ICD-10-CM | POA: Diagnosis present
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
## 2024-01-07 NOTE — Progress Notes (Addendum)
 [facility]  Patient ID: Antonio [name],  MRN: [id number]  PCP: Elliot Charm, MD  Date of Evaluation: 01/07/2024 Time spent:20 minutes  Chief Complaint:  Chief Complaint   Depression; Follow-up    HISTORY/CURRENT STATUS: For routine med check.   States he's doing well. Feels like the Celexa  is working as intended.  Energy and motivation are fair.  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  He is still not sleeping as well as he would like, although it is mostly due to the fact that he goes to bed too late.  He uses his CPAP faithfully.  He is trying to get to bed earlier.  No extreme sadness, tearfulness, or feelings of hopelessness. ADLs and personal hygiene are normal.   No decline in memory.  Appetite has not changed.  No complaints of anxiety.  No mania, delirium, AH/VH.  No SI/HI.  Individual Medical History/ Review of Systems: Changes? :Yes he will be going to PT soon because of the lightheadedness.  Past medications for mental health diagnoses include: Wellbutrin  SR, Wellbutrin  XL, Zoloft caused falls, Cymbalta , naltrexone  for post-COVID fog. Lamictal , Depakote , Trileptal  caused dizziness, fatigue, weakness.   Allergies: Metformin  and related, Wellbutrin  [bupropion ], and Zoloft [sertraline]  Current Medications:  Current Outpatient Medications:    acetaminophen  (TYLENOL ) 500 MG tablet, , Disp: , Rfl:    Acetylcysteine  600 MG CAPS, Take 1 capsule (600 mg total) by mouth 2 (two) times daily., Disp: , Rfl:    amLODipine (NORVASC) 5 MG tablet, Take 1 tablet by mouth daily., Disp: , Rfl:    b complex vitamins capsule, Take 1 capsule by mouth daily., Disp: , Rfl:    cholecalciferol  (VITAMIN D ) 1000 units tablet, Take 5,000 Units by mouth in the morning and at bedtime., Disp: , Rfl:    citalopram  (CELEXA ) 20 MG tablet, Take 1 tablet (20 mg total) by mouth daily., Disp: 90 tablet, Rfl: 1   ELDERBERRY PO, Take  2 each by mouth daily. Gummies, Disp: , Rfl:    ezetimibe (ZETIA) 10 MG tablet, Take 10 mg by mouth daily., Disp: , Rfl:    famotidine  (PEPCID ) 10 MG tablet, Take 10 mg by mouth daily. , Disp: , Rfl:    ferrous sulfate  325 (65 FE) MG tablet, Take 325 mg by mouth daily with breakfast., Disp: , Rfl:    JARDIANCE  10 MG TABS tablet, Take 10 mg by mouth daily., Disp: , Rfl:    potassium chloride  SA (K-DUR,KLOR-CON ) 20 MEQ tablet, Take 20 mEq by mouth daily. , Disp: , Rfl: 3   Turmeric Curcumin 500 MG CAPS, , Disp: , Rfl:    UNABLE TO FIND, CPAP, Disp: , Rfl:    fluticasone  (FLONASE ) 50 MCG/ACT nasal spray, Place 2 sprays into both nostrils daily., Disp: 16 g, Rfl: 10   sildenafil (REVATIO) 20 MG tablet, , Disp: , Rfl:  Medication Side Effects: none  Family Medical/ Social History: Changes? no  MENTAL HEALTH EXAM:  There were no vitals taken for this visit.There is no height or weight on file to calculate BMI.  General Appearance: Casual and Well Groomed  Eye Contact:  Good  Speech:  Clear and Coherent and Normal Rate  Volume:  Normal  Mood:  Euthymic  Affect:  Congruent  Thought Process:  Goal Directed and Descriptions of Associations: Circumstantial  Orientation:  Full (Time, Place, and Person)  Thought Content: Logical   Suicidal Thoughts:  No  Homicidal Thoughts:  No  Memory:  Immediate;   Fair Recent;   Fair Remote;   Good  Judgement:  Good  Insight:  Good  Psychomotor Activity:  Normal  Concentration:  Concentration: Good and Attention Span: Good  Recall:  Good  Fund of Knowledge: Good  Language: Good  Assets:  Communication Skills Desire for Improvement Financial Resources/Insurance Housing Leisure Time Resilience Transportation  ADL's:  Intact  Cognition: WNL  Prognosis:  Good   PCP follows labs  Mini-Mental    Flowsheet Row Office Visit from 03/19/2023 in [facility] Office Visit from 12/03/2020 in [facility]  Total Score (max 30 points ) 29 29   Flowsheet Row ED to Hosp-Admission (Discharged) from 03/29/2020 in [facility] Pre-Admission Testing 60 from 03/03/2020 in [facility]PRE[facility] TEST[facility]  C-SSRS RISK CATEGORY No Risk Error: Question 6 not populated    DIAGNOSES:    ICD-10-CM   1. 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 OSA on CPAP  G47.33       Receiving Psychotherapy: Yes   Dr. Jodie Mitchum  RECOMMENDATIONS:  PDMP was reviewed. No controlled meds.  I provided approximately 20 minutes of face to face time during this encounter, including time spent before and after the visit in records review, medical decision making, counseling pertinent to today's visit, and charting.   He is doing well on the Celexa  so no changes need to be made.  Continue NAC 600 mg bid.  Continue Celexa   20 mg daily. Continue vitamins as per med list. Continue CPAP use. Continue therapy with Dr. Jodie Kendall. Return in 3-4 months.   Verneita Cooks, PA-C

## 2024-01-10 ENCOUNTER — Ambulatory Visit: Admitting: Psychiatry

## 2024-01-10 DIAGNOSIS — F5104 Psychophysiologic insomnia: Secondary | ICD-10-CM

## 2024-01-10 DIAGNOSIS — Z789 Other specified health status: Secondary | ICD-10-CM

## 2024-01-10 DIAGNOSIS — F3341 Major depressive disorder, recurrent, in partial remission: Secondary | ICD-10-CM

## 2024-01-10 DIAGNOSIS — Z630 Problems in relationship with spouse or partner: Secondary | ICD-10-CM | POA: Diagnosis not present

## 2024-01-10 DIAGNOSIS — R454 Irritability and anger: Secondary | ICD-10-CM | POA: Diagnosis not present

## 2024-01-10 DIAGNOSIS — G4733 Obstructive sleep apnea (adult) (pediatric): Secondary | ICD-10-CM | POA: Diagnosis not present

## 2024-01-10 DIAGNOSIS — Z72821 Inadequate sleep hygiene: Secondary | ICD-10-CM

## 2024-01-10 NOTE — Progress Notes (Signed)
 Psychotherapy Progress Note [facility] Jodie Kendall, PhD LP  Patient ID: Antonio [name] Waddell Iten)    MRN: [id number] Therapy format: Family therapy w/ patient -- accompanied by Antonio [name] Antonio [name] Date: 01/10/2024      Start: 3:21p     Stop: 4:16p     Time Spent: 55 min Location: In-person   Session narrative (presenting needs, interim history, self-report of stressors and symptoms, applications of prior therapy, status changes, and interventions made in session) Antonio [name] immediately in tears, says Antonio [name] has been yelling at her so much, she can't stand the negativity, all the family are doing other things, and nobody understands the pain she's in for never being able to have her own children.  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  Renewed refrains of their sparing sex life from way back and feeling unaccepted as a wife.  Allowed to vent, and support/validation provided for collected hardships.  Pt still in the cycle of late rising, resting a lot, procrastinating, and keeping himself up late supposedly getting the dogs finished, thought the whole pattern seems to conspires to make for less time available to Antonio [name] -- and to perceived demands, even though it courts the very criticism he doesn't want.  Acknowledges irritability, and again with brain fog.  Admittedly inconsistent about times he takes meds.  Encouraged among other things to make sure B complex is in the morning and not taking 2 of them and to regulate meds, fluids, and light better so he stands a fighting chance of a normal circadian rhythm and benefitting again from mood stabilizing medication.  Renewed challenges to manage his temper better than he does and to organize earlier for the dogs' evening routine, say, right after dinner, by 7pm  or so, with option for Antonio [name] and Antonio [name] to work them together in the evening both as a way of speeding things along and pulling together on something.  Therapeutic modalities: Cognitive Behavioral Therapy, Solution-Oriented/Positive Psychology, and Ego-Supportive  Mental Status/Observations:  Appearance:   Casual     Behavior:  Appropriate  Motor:  Normal  Speech/Language:   Clear and Coherent  Affect:  Appropriate  Mood:  dysthymic  Thought process:  concrete  Thought content:    WNL  Sensory/Perceptual disturbances:    WNL  Orientation:  Fully oriented  Attention:  Good    Concentration:  Fair  Memory:  WNL  Insight:    Fair  Judgment:   Good  Impulse Control:  Fair   Risk Assessment: Danger to Self: No Self-injurious Behavior: No Danger to Others: No Physical Aggression / Violence: No Duty to Warn: No Access to Firearms a concern: No  Assessment of progress:  stabilized  Diagnosis:   ICD-10-CM   1. 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 Relationship problem between partners  Z63.0     4. OSA on CPAP  G47.33     5. Inadequate sleep hygiene  Z72.821     6. Caffeine use, r/o disorder  Z78.9     7. Irritability and anger  R45.4      Plan:  Sleep hygiene and energy regulation Firm caffeine curfew 4pm -- all forms, reliably Carb curfew before bed, OK to  snack earlier Turn the lights down about 9pm, bring up light as early in the morning as possible Go on to bed by midnight, no matter what Rise by 10am if at all possible Make sure CPAP is adjusted, clean, and maintained correctly.  Recheck need if desired. Enable earlier bed time by pressing through dog care tasks May try melatonin 1-2 hrs before sleep, 3-5mg .  At least by 10:30pm. Practice 5-7 big, deep breaths in the morning and anytime he needs a refresher, or suspects underbreathing Communication/conflict/relationship Keep trying to get ahead  of temper -- say you feel frustrated rather than just play it out.  Options to growl, time out, or something else first. Encourage use of do overs instead of confront and defend dynamics If able, practice saying I'm frustrated or Antonio [name] what Antonio [name] means before reacting to what he believes he hears from her Continue general approach trying to be kinder on the first reaction, giving grace first with Antonio [name], don't make it have anything to do with how he feels like he's being treated For encouragement's sake, if desired, Antonio [name] Antonio [name] if she can tell when he is trying Look for opportunities to establish a little quality time together, e.g., shared TV or music, with just touching lightly to get used to it again, e.g., hold hands Pull out old favorite music and activities, including asking [name] to play cool jazz, to help with mood and possibly share Antonio [name] make priority of asking/getting Antonio [name]'s attention before expecting it -- he is older, sleep-disordered, and long in the habit of self-absorption, plus it's good form anyway.  Likewise try to keep instructions, commentaries, and complaints to few points at once -- he is more likely to react if feeling flooded.  Take into account Pt's age, circadian rhythm problem, chronic difficulty treating sleep apnea, and hx of ischemic brain changes when interpreting problems with cognition, motivation, and emotional dyscontrol. Continue abstinence from addictive habits of knife-buying and cross-dressing.  Use other ways of self-soothing, preferably approach Antonio [name] for touch time and work on trusting her better to approach, in order to recruit normal sexual interest instead. Self-remind that cross-dressing is an old attempt to feel closer when couldn't; take the urge as a cue to get closer in reality now Antonio [name] encouraged to stop sleeping on the floor, return to the bed (if she can do it without feeling like she is selling out her own protest).  Will encourage also to improve her own  sleep habit from 4 hr/night. Continue joint sessions as able and interested Pain/orthopedic Add omega 3 for added antiinflammatory benefit Get adequate activity/movement -- resume walking or stationary bike if it suits Kidney care, including reducing sodas and good fluid by day, hold down late evening fluids Address pain as needed Other health care Maintain diabetic regimen Follow through on ENT assessment to treat problem drainage and sinus congestion, r/o CSF and other neuro-related Check situational BP to help inform physician Check actual expenses before assuming things are too expensive and writing off medical recommendations Fact-check any of the above with physician Dog care workload Endorse large commitment to rescue work, but always OK to reconsider if it's too much Try to work ahead in the day/evening, do a little something when thinking of it, like preload food dishes Try to knock out evening dog care in 1-2 sittings, not interrupt repeatedly and stretch it out May always team up with Antonio [name] rather than complain, e.g., for her to preload some feeding and health tasks so he can  more grab and go -- at their discretion.  Also may team up some amount of time in evening care routine, which may go faster for teamwork and energizing him for the task. Switch from yelling to using a clicker or other noisemaker Other recommendations/advice -- As may be noted above.  Continue to utilize previously learned skills ad lib. Medication compliance -- Maintain medication as prescribed and work faithfully with relevant prescriber(s) if any changes are desired or seem indicated. Crisis service -- Aware of call list and work-in appts.  Call the clinic on-call service, 988/hotline, 911, or present to [facility] or ER if any life-threatening psychiatric crisis. Followup -- Return for time as already scheduled, avail earlier @ PT's need.  Next scheduled visit with me 02/08/2024.  Next scheduled in this office  02/08/2024.  Lamar Kendall, PhD Jodie Kendall, PhD LP Clinical Psychologist, [facility] [address] [place], [place] [address] [phone]

## 2024-01-16 NOTE — Therapy (Signed)
 " OUTPATIENT PHYSICAL THERAPY VESTIBULAR TREATMENT     Patient Name: Antonio [name] MRN: [id number] DOB:August 14, 1940, 83 y.o., male Today's Date: 01/18/2024  END OF SESSION:  PT End of Session - 01/18/24 1148     Visit Number 2    Number of Visits 5    Date for Recertification  02/11/24    Authorization Type [employer]    PT Start Time 1109    PT Stop Time 1143    PT Time Calculation (min) 34 min    Equipment Utilized During Treatment Gait belt    Activity Tolerance Patient tolerated treatment well    Behavior During Therapy WFL for tasks assessed/performed           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   Chronic back pain    Depression    Diabetes mellitus without complication (HCC)    not on meds    DNR (do not resuscitate) 03/30/2020   GERD (gastroesophageal reflux disease)    Heart murmur    History of carpal tunnel syndrome    Bilateral   Hyperlipidemia    borderline   Hypertension    Leg edema, right    OSA on CPAP    RBBB 01/11/2016   Noted on EKG   Past Surgical History:  Procedure Laterality Date   CARPAL TUNNEL RELEASE Bilateral    CHOLECYSTECTOMY N/A 03/31/2020   Procedure: LAPAROSCOPIC CHOLECYSTECTOMY WITH INTRAOPERATIVE CHOLANGIOGRAM, TAP BLOCK;  Surgeon: Gladis Cough, MD;  Location: [facility];  Service: General;  Laterality: N/A;   INGUINAL HERNIA REPAIR N/A 03/11/2020   Procedure: LAPAROSCOPIC BILATERAL INGUINAL HERNIA REPAIR WITH MESH, TAP BLOCK;  Surgeon: Sheldon Standing, MD;  Location: [facility];  Service: General;  Laterality: N/A;   LUMBAR LAMINECTOMY/DECOMPRESSION MICRODISCECTOMY  09/06/2011   Procedure: LUMBAR LAMINECTOMY/DECOMPRESSION MICRODISCECTOMY 1 LEVEL;  Surgeon: Lamar [name] Peaches, MD;  Location: [facility];  Service: Neurosurgery;  Laterality: Left;  Left lumbar three-four Lumbar  Laminotomy/microdiskectomy   LUMBAR LAMINECTOMY/DECOMPRESSION MICRODISCECTOMY Left 12/26/2012   Procedure: LUMBAR LAMINECTOMY/DECOMPRESSION MICRODISCECTOMY LEFT LUMBAR THREE-FOUR ;  Surgeon: Lamar [name] Peaches, MD;  Location: [facility];  Service: Neurosurgery;  Laterality: Left;   POLYPECTOMY     removed small intestine   REVERSE SHOULDER ARTHROPLASTY Left 07/11/2019   Procedure: REVERSE SHOULDER ARTHROPLASTY;  Surgeon: Kay Kemps, MD;  Location: [facility];  Service: Orthopedics;  Laterality: Left;  interscalene block   ROTATOR CUFF REPAIR Left    TONSILLECTOMY     Patient Active Problem List   Diagnosis Date Noted   Chronic rhinitis 10/05/2023   Deviated nasal septum 10/05/2023   Hypertrophy of nasal turbinates 10/05/2023   Dizziness 10/05/2023   Tinnitus of both ears 10/05/2023   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   Hyperbilirubinemia 03/30/2020   Nausea & vomiting 03/30/2020   Hypokalemia 03/30/2020   DNR (do not resuscitate) 03/30/2020   Choledocholithiasis with acute cholecystitis 03/30/2020   Gait abnormality 12/30/2019   Sensorineural hearing loss, bilateral 12/30/2019   Chronic kidney disease, stage 3a (HCC) 12/30/2019   Daytime hypersomnia 12/30/2019   Diabetic renal disease (HCC) 12/30/2019   Dyslipidemia 12/30/2019   ED (erectile dysfunction) of organic origin 12/30/2019   Hypertension 12/30/2019   Iron deficiency anemia 12/30/2019   Low back pain 12/30/2019   Memory loss 12/30/2019   Mild depression 12/30/2019  Obstructive sleep apnea syndrome 12/30/2019   Peripheral venous insufficiency 12/30/2019   Prostatic hypertrophy 12/30/2019   Recurrent major depression in remission 12/30/2019   Second degree burn of lower limb 12/30/2019   Skin sensation disturbance 12/30/2019   Vitamin D  deficiency 12/30/2019   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   Scrotal hernia - right, s/p lap repair w mesh 03/11/2020 12/30/2019   Inguinal hernia, left, s/p lap  repair w mesh 03/11/2020 12/30/2019   Diabetes (HCC) 09/13/2019   Bilateral impacted cerumen 08/01/2019   Foreign body in auricle of right ear 08/01/2019   H/O total shoulder replacement, 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   Major depressive disorder, recurrent episode, moderate (HCC) 01/06/2018   Hyperlipidemia    HNP (herniated nucleus pulposus), lumbar 12/26/2012    PCP: [facility] REFERRING PROVIDER: Karis Clunes, MD  REFERRING DIAG:  R42 (ICD-10-CM) - Dizziness    THERAPY DIAG:  Dizziness and giddiness  Unsteadiness on feet  ONSET DATE: 1 years  Rationale for Evaluation and Treatment: Rehabilitation  SUBJECTIVE:   SUBJECTIVE STATEMENT: Nothing new. Dizziness currently is about average- about 6/10.     Pt accompanied by: self  PERTINENT HISTORY: 83 y.o. male who presents today complaining of chronic dizziness for the past year.  He describes the dizziness as a lightheadedness sensation.  He denies any spinning vertigo.  He also has chronic nasal congestion and chronic rhinosinusitis.  He complains of frequent nasal drainage.  He is currently on Claritin.  He also has a history of bilateral hearing loss and tinnitus.  He wears bilateral hearing aids.  PAIN:  Are you having pain? No  PRECAUTIONS: None  RED FLAGS: None   WEIGHT BEARING RESTRICTIONS: No  FALLS: Has patient fallen in last 6 months? No  LIVING ENVIRONMENT: Lives with: lives with their family Lives in: House/apartment Stairs: split level home Has following equipment at home: None  PLOF: Independent  PATIENT GOALS:   OBJECTIVE:      TODAY'S TREATMENT: 01/18/24 Activity Comments  review of HEP: - Corner Balance Feet Together With Eyes Closed  - 3 sets - 30 sec hold - Corner Balance Feet Together: Eyes Open With Head Turns  - 1 x daily - 7 x weekly - 3 sets - 30 sec hold - Corner Balance Feet Together: Eyes Closed With Head Turns  - 1 x daily - 7 x weekly - 3 sets - 30 sec hold In corner with chair in front  for safety. 7/10 dizziness with head turns (from 6/10 baseline)   Brandt daroff 1x Tolerated well                  MOTION SENSITIVITY:  Motion Sensitivity Quotient Intensity: 0 = none, 1 = Lightheaded, 2 = Mild, 3 = Moderate, 4 = Severe, 5 = Vomiting *pt reports baseline of 6/10 dizziness which he states is lightheaded. Was instructed to indicate is sx increase some baseline.  Intensity  1. Sitting to supine 0  2. Supine to L side 0  3. Supine to R side 0  4. Supine to sitting 0  5. L Hallpike-Dix 0  6. Up from L  1  7. R Hallpike-Dix 1  8. Up from R  1  9. Sitting, head tipped to L knee 0  10. Head up from L knee 1  11. Sitting, head tipped to R knee 0  12. Head up from R knee 1  13. Sitting head turns x5 1  14.Sitting head nods x5 0  15.  In stance, 180 turn to L  0  16. In stance, 180 turn to R 0      PATIENT EDUCATION: HOME EXERCISE PROGRAM Last updated: 01/18/24 Access Code: [email or web address] URL: [email or web address] Date: 01/18/2024 Prepared by: [facility] - Outpatient  Rehab - [facility]  Exercises - Corner Balance Feet Together With Eyes Closed  - 3 sets - 30 sec hold - Corner Balance Feet Together: Eyes Open With Head Turns  - 1 x daily - 7 x weekly - 3 sets - 30 sec hold - Corner Balance Feet Together: Eyes Closed With Head Turns  - 1 x daily - 7 x weekly - 3 sets - 30 sec hold - Brandt-Daroff Vestibular Exercise  - 1 x daily - 5 x weekly - 2 sets - 3-5 reps - Seated Nose to Left Knee Vestibular Habituation  - 1 x daily - 5 x weekly - 2 sets - 3-5 reps - Seated Nose to Right Knee Vestibular Habituation  - 1 x daily - 5 x weekly - 2 sets - 3-5 reps   PATIENT EDUCATION: Education details: edu on intended effects of HEP, frequency, intensity of exercises ; answered pt's questions on potential etiology of his dizziness  Person educated: Patient Education method: Explanation, Demonstration, Tactile cues, Verbal cues, and Handouts Education  comprehension: verbalized understanding and returned demonstration    Note: Objective measures were completed at Evaluation unless otherwise noted.  DIAGNOSTIC FINDINGS: reports hx of MRI which were unrevealing  COGNITION: Overall cognitive status: Within functional limits for tasks assessed  POSTURE:  No Significant postural limitations  Cervical ROM:    WFL with end-range limitations all planes but not painful and asymptomatic  STRENGTH: NT    PATIENT SURVEYS:  DHI: THE DIZZINESS HANDICAP INVENTORY (DHI)  P1. Does looking up increase your problem? 0 = No  E2. Because of your problem, do you feel frustrated? 2 = Sometimes  F3. Because of your problem, do you restrict your travel for business or recreation?  0 = No  P4. Does walking down the aisle of a supermarket increase your problems?  0 = No  F5. Because of your problem, do you have difficulty getting into or out of bed?  0 = No  F6. Does your problem significantly restrict your participation in social activities, such as going out to dinner, going to the movies, dancing, or going to parties? 0 = No  F7. Because of your problem, do you have difficulty reading?  0 = No  P8. Does performing more ambitious activities such as sports, dancing, household chores (sweeping or putting dishes away) increase your problems?  0 = No  E9. Because of your problem, are you afraid to leave your home without having without having someone accompany you?  2 = Sometimes  E10. Because of your problem have you been embarrassed in front of others?  0 = No  P11. Do quick movements of your head increase your problem?  0 = No  F12. Because of your problem, do you avoid heights?  0 = No  P13. Does turning over in bed increase your problem?  0 = No  F14. Because of your problem, is it difficult for you to do strenuous homework or yard work? 2 = Sometimes  E15. Because of your problem, are you afraid people may think you are intoxicated? 0 = No  F16.  Because of your problem, is it difficult for you to go for a walk by yourself?  0 =  No  P17. Does walking down a sidewalk increase your problem?  0 = No  E18.Because of your problem, is it difficult for you to concentrate 0 = No  F19. Because of your problem, is it difficult for you to walk around your house in the dark? 0 = No  E20. Because of your problem, are you afraid to stay home alone?  0 = No  E21. Because of your problem, do you feel handicapped? 0 = No  E22. Has the problem placed stress on your relationships with members of your family or friends? 2 = Sometimes  E23. Because of your problem, are you depressed?  2 = Sometimes  F24. Does your problem interfere with your job or household responsibilities?  0 = No  P25. Does bending over increase your problem?  2 = Sometimes  TOTAL 12    DHI Scoring Instructions  The patient is asked to answer each question as it pertains to dizziness or unsteadiness problems, specifically  considering their condition during the last month. Questions are designed to incorporate functional (F), physical  (P), and emotional (E) impacts on disability.   Scores greater than 10 points should be referred to balance specialists for further evaluation.   16-34 Points (mild handicap)  36-52 Points (moderate handicap)  54+ Points (severe handicap)  Minimally Detectable Change: 17 points ([address] [place], 1990)  [place], G. SHAUNNA. and Carrington, C. W. (1990). The development of the Dizziness Handicap Inventory. Archives of Otolaryngology - Head and Neck Surgery 116(4): [id number].   VESTIBULAR ASSESSMENT:  GENERAL OBSERVATION: wears bifocals   SYMPTOM BEHAVIOR:  Subjective history: 1+ year hx of lightheadedness, imbalance, mental fog and reports hx of COVID x 2 when symptoms initially appeared  Non-Vestibular symptoms: none noted other than head/sinus congestion  Type of dizziness: Imbalance (Disequilibrium) and Lightheadedness/Faint  Frequency:  constant  Duration: exacerbations last 1-2 hr  Aggravating factors: No known aggravating factors and    Relieving factors: rest  Progression of symptoms: unchanged  OCULOMOTOR EXAM:  Ocular Alignment: normal  Ocular ROM: No Limitations  Spontaneous Nystagmus: absent  Gaze-Induced Nystagmus: age appropriate nystagmus at end range  Smooth Pursuits: saccadic intrusions vs coordination?  Saccades: hypermetric/overshoots  Convergence/Divergence: 6 cm   Cover-cross-cover test: Normal    VESTIBULAR - OCULAR REFLEX:   Slow VOR: Normal  VOR Cancellation: Normal  Head-Impulse Test: difficult to assess due to guarding neck movements  Dynamic Visual Acuity: Not able to be assessed   POSITIONAL TESTING: Other: denies any positional dizziness  MOTION SENSITIVITY:  Motion Sensitivity Quotient Intensity: 0 = none, 1 = Lightheaded, 2 = Mild, 3 = Moderate, 4 = Severe, 5 = Vomiting  Intensity  1. Sitting to supine   2. Supine to L side   3. Supine to R side   4. Supine to sitting   5. L Hallpike-Dix   6. Up from L    7. R Hallpike-Dix   8. Up from R    9. Sitting, head tipped to L knee   10. Head up from L knee   11. Sitting, head tipped to R knee   12. Head up from R knee   13. Sitting head turns x5   14.Sitting head nods x5   15. In stance, 180 turn to L    16. In stance, 180 turn to R     OTHOSTATICS: not done  FUNCTIONAL GAIT: 22/30    M-CTSIB  Condition 1: Firm Surface, EO 30 Sec, Normal Sway  Condition 2: Firm Surface, EC 30 Sec, Mild Sway  Condition 3: Foam Surface, EO 30 Sec, Mild Sway  Condition 4: Foam Surface, EC 3 Sec, Severe Sway                                                                                                                               TREATMENT DATE: 01/07/24    PATIENT EDUCATION: Education details: assessment details, rationale of PT intervention, HEP initiation Person educated: Patient Education method: Explanation and  Handouts Education comprehension: verbalized understanding  HOME EXERCISE PROGRAM: Access Code: [email or web address] URL: [email or web address] Date: 01/07/2024 Prepared by: Kelly Halpin  Exercises - Corner Balance Feet Together With Eyes Closed  - 3 sets - 30 sec hold - Corner Balance Feet Together: Eyes Open With Head Turns  - 1 x daily - 7 x weekly - 3 sets - 30 sec hold - Corner Balance Feet Together: Eyes Closed With Head Turns  - 1 x daily - 7 x weekly - 3 sets - 30 sec hold  GOALS: Goals reviewed with patient? Yes  SHORT TERM GOALS: Target date: same as LTG   LONG TERM GOALS: Target date: 02/11/2024    The patient will be independent with HEP for gaze adaptation, habituation, balance, and general mobility. Baseline:  Goal status: IN PROGRESS  2.  Improve postural control per mild sway x 30 sec condition 4 M-CTSIB Baseline: severe x 3 sec Goal status: IN PROGRESS  3.  Improve balance and reduce fall risk per score 25/30 Functional Gait Assessment Baseline: 22/30 Goal status: IN PROGRESS  4.  Report improved symptoms per score 0/100 Dizziness Handicap Inventory Baseline: 12/100 Goal status: IN PROGRESS   ASSESSMENT:  CLINICAL IMPRESSION: Patient arrived to session without new complaints. Session focused on HEP review, which patient performed with good safety and stability and mild increase in symptom provocation. MSQ revealed motion sensitivity to sitting up and lifting head up from dependent position as well as head turns. HEP was updated with habituation to address this. Patient tolerated session well and without complaints at end of appointment. .   OBJECTIVE IMPAIRMENTS: decreased balance, decreased endurance, difficulty walking, and dizziness.   ACTIVITY LIMITATIONS: carrying, lifting, bending, standing, and locomotion level  PARTICIPATION LIMITATIONS: meal prep, cleaning, laundry, driving, shopping, community activity, and yard work  PERSONAL FACTORS:  Age, Time since onset of injury/illness/exacerbation, and 1-2 comorbidities: PMH including COVID, DM, depression are also affecting patient's functional outcome.   REHAB POTENTIAL: Good  CLINICAL DECISION MAKING: Evolving/moderate complexity  EVALUATION COMPLEXITY: Moderate   PLAN:  PT FREQUENCY: 1x/week  PT DURATION: other: 5 weeks  PLANNED INTERVENTIONS: 97750- Physical Performance Testing, 97110-Therapeutic exercises, 97530- Therapeutic activity, [id number]- Neuromuscular re-education, 97535- Self Care, [id number]- Manual therapy, [id number]- Gait training, [phone]- Canalith repositioning, [id number]- Aquatic Therapy, [phone] (1-2 muscles), 20561 (3+ muscles)- Dry Needling, and Patient/Family education  PLAN FOR NEXT SESSION: HEP review, habituation to forward bending, motion sensitivities?, difficulty with tandem balance  Louana Terrilyn Christians, PT, DPT 01/18/2024 11:49 AM  [facility] [address] [place], [address] [place], [place] [address] Phone # [phone] Fax # [phone]    "

## 2024-01-18 ENCOUNTER — Encounter: Payer: Self-pay | Admitting: Physical Therapy

## 2024-01-18 ENCOUNTER — Ambulatory Visit: Admitting: Physical Therapy

## 2024-01-18 DIAGNOSIS — R42 Dizziness and giddiness: Secondary | ICD-10-CM

## 2024-01-18 DIAGNOSIS — R2681 Unsteadiness on feet: Secondary | ICD-10-CM

## 2024-01-22 ENCOUNTER — Ambulatory Visit

## 2024-01-22 DIAGNOSIS — R42 Dizziness and giddiness: Secondary | ICD-10-CM

## 2024-01-22 DIAGNOSIS — R2681 Unsteadiness on feet: Secondary | ICD-10-CM

## 2024-01-22 NOTE — Therapy (Signed)
 " OUTPATIENT PHYSICAL THERAPY VESTIBULAR TREATMENT     Patient Name: Antonio [name] MRN: [id number] DOB:[birth year], 83 y.o., male Today's Date: 01/22/2024  END OF SESSION:  PT End of Session - 01/22/24 1456     Visit Number 3    Number of Visits 5    Date for Recertification  02/11/24    Authorization Type [employer]    PT Start Time 1456   late arrival   PT Stop Time 1530    PT Time Calculation (min) 34 min    Equipment Utilized During Treatment Gait belt    Activity Tolerance Patient tolerated treatment well    Behavior During Therapy WFL for tasks assessed/performed           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   Chronic back pain    Depression    Diabetes mellitus without complication (HCC)    not on meds    DNR (do not resuscitate) 03/30/2020   GERD (gastroesophageal reflux disease)    Heart murmur    History of carpal tunnel syndrome    Bilateral   Hyperlipidemia    borderline   Hypertension    Leg edema, right    OSA on CPAP    RBBB 01/11/2016   Noted on EKG   Past Surgical History:  Procedure Laterality Date   CARPAL TUNNEL RELEASE Bilateral    CHOLECYSTECTOMY N/A 03/31/2020   Procedure: LAPAROSCOPIC CHOLECYSTECTOMY WITH INTRAOPERATIVE CHOLANGIOGRAM, TAP BLOCK;  Surgeon: Gladis Cough, MD;  Location: [facility];  Service: General;  Laterality: N/A;   INGUINAL HERNIA REPAIR N/A 03/11/2020   Procedure: LAPAROSCOPIC BILATERAL INGUINAL HERNIA REPAIR WITH MESH, TAP BLOCK;  Surgeon: Sheldon Standing, MD;  Location: [facility];  Service: General;  Laterality: N/A;   LUMBAR LAMINECTOMY/DECOMPRESSION MICRODISCECTOMY  09/06/2011   Procedure: LUMBAR LAMINECTOMY/DECOMPRESSION MICRODISCECTOMY 1 LEVEL;  Surgeon: Lamar [name] Peaches, MD;  Location: [facility];  Service: Neurosurgery;  Laterality: Left;  Left lumbar three-four  Lumbar Laminotomy/microdiskectomy   LUMBAR LAMINECTOMY/DECOMPRESSION MICRODISCECTOMY Left 12/26/2012   Procedure: LUMBAR LAMINECTOMY/DECOMPRESSION MICRODISCECTOMY LEFT LUMBAR THREE-FOUR ;  Surgeon: Lamar [name] Peaches, MD;  Location: [facility];  Service: Neurosurgery;  Laterality: Left;   POLYPECTOMY     removed small intestine   REVERSE SHOULDER ARTHROPLASTY Left 07/11/2019   Procedure: REVERSE SHOULDER ARTHROPLASTY;  Surgeon: Kay Kemps, MD;  Location: [facility];  Service: Orthopedics;  Laterality: Left;  interscalene block   ROTATOR CUFF REPAIR Left    TONSILLECTOMY     Patient Active Problem List   Diagnosis Date Noted   Chronic rhinitis 10/05/2023   Deviated nasal septum 10/05/2023   Hypertrophy of nasal turbinates 10/05/2023   Dizziness 10/05/2023   Tinnitus of both ears 10/05/2023   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   Hyperbilirubinemia 03/30/2020   Nausea & vomiting 03/30/2020   Hypokalemia 03/30/2020   DNR (do not resuscitate) 03/30/2020   Choledocholithiasis with acute cholecystitis 03/30/2020   Gait abnormality 12/30/2019   Sensorineural hearing loss, bilateral 12/30/2019   Chronic kidney disease, stage 3a (HCC) 12/30/2019   Daytime hypersomnia 12/30/2019   Diabetic renal disease (HCC) 12/30/2019   Dyslipidemia 12/30/2019   ED (erectile dysfunction) of organic origin 12/30/2019   Hypertension 12/30/2019   Iron deficiency anemia 12/30/2019   Low back pain 12/30/2019   Memory loss 12/30/2019  Mild depression 12/30/2019   Obstructive sleep apnea syndrome 12/30/2019   Peripheral venous insufficiency 12/30/2019   Prostatic hypertrophy 12/30/2019   Recurrent major depression in remission 12/30/2019   Second degree burn of lower limb 12/30/2019   Skin sensation disturbance 12/30/2019   Vitamin D  deficiency 12/30/2019   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   Scrotal hernia - right, s/p lap repair w mesh 03/11/2020 12/30/2019   Inguinal hernia, left, s/p  lap repair w mesh 03/11/2020 12/30/2019   Diabetes (HCC) 09/13/2019   Bilateral impacted cerumen 08/01/2019   Foreign body in auricle of right ear 08/01/2019   H/O total shoulder replacement, 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   Major depressive disorder, recurrent episode, moderate (HCC) 01/06/2018   Hyperlipidemia    HNP (herniated nucleus pulposus), lumbar 12/26/2012    PCP: [facility] REFERRING PROVIDER: Karis Clunes, MD  REFERRING DIAG:  R42 (ICD-10-CM) - Dizziness    THERAPY DIAG:  Dizziness and giddiness  Unsteadiness on feet  ONSET DATE: 1 years  Rationale for Evaluation and Treatment: Rehabilitation  SUBJECTIVE:   SUBJECTIVE STATEMENT: Today is a bad day with the symptoms.  Feeling of lightheaded that is more severe and doesn't abate.  The bad days tend to last all day    Pt accompanied by: self  PERTINENT HISTORY: 83 y.o. male who presents today complaining of chronic dizziness for the past year.  He describes the dizziness as a lightheadedness sensation.  He denies any spinning vertigo.  He also has chronic nasal congestion and chronic rhinosinusitis.  He complains of frequent nasal drainage.  He is currently on Claritin.  He also has a history of bilateral hearing loss and tinnitus.  He wears bilateral hearing aids.  PAIN:  Are you having pain? No  PRECAUTIONS: None  RED FLAGS: None   WEIGHT BEARING RESTRICTIONS: No  FALLS: Has patient fallen in last 6 months? No  LIVING ENVIRONMENT: Lives with: lives with their family Lives in: House/apartment Stairs: split level home Has following equipment at home: None  PLOF: Independent  PATIENT GOALS:   OBJECTIVE:  Baseline symptoms: 15/10--today is a bad day, higher than normal  TODAY'S TREATMENT: 01/22/24 Activity Comments  Vitals: 173/84 mmHg, 81 bpm   NU-step speed intervals x 8.5 min 2 min warm-up level 6 30 sec sprint; 60 sec recovery resistance 4--good maintenance of 100+ SPM 1 min cool down 99 bpm end of trial   M-CTSIB   Pt education regarding vestibular rehab principles            M-CTSIB  Condition 1: Firm Surface, EO 30 Sec, Normal Sway  Condition 2: Firm Surface, EC 30 Sec, Mild Sway  Condition 3: Foam Surface, EO 30 Sec, Normal Sway  Condition 4: Foam Surface, EC 20 Sec, Moderate Sway      TODAY'S TREATMENT: 01/18/24 Activity Comments  review of HEP: - Corner Balance Feet Together With Eyes Closed  - 3 sets - 30 sec hold - Corner Balance Feet Together: Eyes Open With Head Turns  - 1 x daily - 7 x weekly - 3 sets - 30 sec hold - Corner Balance Feet Together: Eyes Closed With Head Turns  - 1 x daily - 7 x weekly - 3 sets - 30 sec hold In corner with chair in front for safety. 7/10 dizziness with head turns (from 6/10 baseline)   Brandt daroff 1x Tolerated well                  MOTION SENSITIVITY:  Motion  Sensitivity Quotient Intensity: 0 = none, 1 = Lightheaded, 2 = Mild, 3 = Moderate, 4 = Severe, 5 = Vomiting *pt reports baseline of 6/10 dizziness which he states is lightheaded. Was instructed to indicate is sx increase some baseline.  Intensity  1. Sitting to supine 0  2. Supine to L side 0  3. Supine to R side 0  4. Supine to sitting 0  5. L Hallpike-Dix 0  6. Up from L  1  7. R Hallpike-Dix 1  8. Up from R  1  9. Sitting, head tipped to L knee 0  10. Head up from L knee 1  11. Sitting, head tipped to R knee 0  12. Head up from R knee 1  13. Sitting head turns x5 1  14.Sitting head nods x5 0  15. In stance, 180 turn to L  0  16. In stance, 180 turn to R 0      PATIENT EDUCATION: HOME EXERCISE PROGRAM Last updated: 01/18/24 Access Code: [email or web address] URL: [email or web address] Date: 01/18/2024 Prepared by: [facility] - Outpatient  Rehab - [facility]  Exercises - Corner Balance Feet Together With Eyes Closed  - 3 sets - 30 sec hold - Corner Balance Feet Together: Eyes Open With Head Turns  - 1 x daily - 7 x weekly - 3 sets - 30 sec  hold - Corner Balance Feet Together: Eyes Closed With Head Turns  - 1 x daily - 7 x weekly - 3 sets - 30 sec hold - Brandt-Daroff Vestibular Exercise  - 1 x daily - 5 x weekly - 2 sets - 3-5 reps - Seated Nose to Left Knee Vestibular Habituation  - 1 x daily - 5 x weekly - 2 sets - 3-5 reps - Seated Nose to Right Knee Vestibular Habituation  - 1 x daily - 5 x weekly - 2 sets - 3-5 reps   PATIENT EDUCATION: Education details: edu on intended effects of HEP, frequency, intensity of exercises ; answered pt's questions on potential etiology of his dizziness  Person educated: Patient Education method: Explanation, Demonstration, Tactile cues, Verbal cues, and Handouts Education comprehension: verbalized understanding and returned demonstration    Note: Objective measures were completed at Evaluation unless otherwise noted.  DIAGNOSTIC FINDINGS: reports hx of MRI which were unrevealing  COGNITION: Overall cognitive status: Within functional limits for tasks assessed  POSTURE:  No Significant postural limitations  Cervical ROM:    WFL with end-range limitations all planes but not painful and asymptomatic  STRENGTH: NT    PATIENT SURVEYS:  DHI: THE DIZZINESS HANDICAP INVENTORY (DHI)  P1. Does looking up increase your problem? 0 = No  E2. Because of your problem, do you feel frustrated? 2 = Sometimes  F3. Because of your problem, do you restrict your travel for business or recreation?  0 = No  P4. Does walking down the aisle of a supermarket increase your problems?  0 = No  F5. Because of your problem, do you have difficulty getting into or out of bed?  0 = No  F6. Does your problem significantly restrict your participation in social activities, such as going out to dinner, going to the movies, dancing, or going to parties? 0 = No  F7. Because of your problem, do you have difficulty reading?  0 = No  P8. Does performing more ambitious activities such as sports, dancing, household  chores (sweeping or putting dishes away) increase your problems?  0 = No  E9. Because  of your problem, are you afraid to leave your home without having without having someone accompany you?  2 = Sometimes  E10. Because of your problem have you been embarrassed in front of others?  0 = No  P11. Do quick movements of your head increase your problem?  0 = No  F12. Because of your problem, do you avoid heights?  0 = No  P13. Does turning over in bed increase your problem?  0 = No  F14. Because of your problem, is it difficult for you to do strenuous homework or yard work? 2 = Sometimes  E15. Because of your problem, are you afraid people may think you are intoxicated? 0 = No  F16. Because of your problem, is it difficult for you to go for a walk by yourself?  0 = No  P17. Does walking down a sidewalk increase your problem?  0 = No  E18.Because of your problem, is it difficult for you to concentrate 0 = No  F19. Because of your problem, is it difficult for you to walk around your house in the dark? 0 = No  E20. Because of your problem, are you afraid to stay home alone?  0 = No  E21. Because of your problem, do you feel handicapped? 0 = No  E22. Has the problem placed stress on your relationships with members of your family or friends? 2 = Sometimes  E23. Because of your problem, are you depressed?  2 = Sometimes  F24. Does your problem interfere with your job or household responsibilities?  0 = No  P25. Does bending over increase your problem?  2 = Sometimes  TOTAL 12    DHI Scoring Instructions  The patient is asked to answer each question as it pertains to dizziness or unsteadiness problems, specifically  considering their condition during the last month. Questions are designed to incorporate functional (F), physical  (P), and emotional (E) impacts on disability.   Scores greater than 10 points should be referred to balance specialists for further evaluation.   16-34 Points (mild handicap)   36-52 Points (moderate handicap)  54+ Points (severe handicap)  Minimally Detectable Change: 17 points ([address] [place], 1990)  [place], G. SHAUNNA. and [place], C. W. (1990). The development of the Dizziness Handicap Inventory. Archives of Otolaryngology - Head and Neck Surgery 116(4): [id number].   VESTIBULAR ASSESSMENT:  GENERAL OBSERVATION: wears bifocals   SYMPTOM BEHAVIOR:  Subjective history: 1+ year hx of lightheadedness, imbalance, mental fog and reports hx of COVID x 2 when symptoms initially appeared  Non-Vestibular symptoms: none noted other than head/sinus congestion  Type of dizziness: Imbalance (Disequilibrium) and Lightheadedness/Faint  Frequency: constant  Duration: exacerbations last 1-2 hr  Aggravating factors: No known aggravating factors and    Relieving factors: rest  Progression of symptoms: unchanged  OCULOMOTOR EXAM:  Ocular Alignment: normal  Ocular ROM: No Limitations  Spontaneous Nystagmus: absent  Gaze-Induced Nystagmus: age appropriate nystagmus at end range  Smooth Pursuits: saccadic intrusions vs coordination?  Saccades: hypermetric/overshoots  Convergence/Divergence: 6 cm   Cover-cross-cover test: Normal    VESTIBULAR - OCULAR REFLEX:   Slow VOR: Normal  VOR Cancellation: Normal  Head-Impulse Test: difficult to assess due to guarding neck movements  Dynamic Visual Acuity: Not able to be assessed   POSITIONAL TESTING: Other: denies any positional dizziness  MOTION SENSITIVITY:  Motion Sensitivity Quotient Intensity: 0 = none, 1 = Lightheaded, 2 = Mild, 3 = Moderate, 4 = Severe, 5 = Vomiting  Intensity  1. Sitting to supine   2. Supine to L side   3. Supine to R side   4. Supine to sitting   5. L Hallpike-Dix   6. Up from L    7. R Hallpike-Dix   8. Up from R    9. Sitting, head tipped to L knee   10. Head up from L knee   11. Sitting, head tipped to R knee   12. Head up from R knee   13. Sitting head turns x5   14.Sitting head  nods x5   15. In stance, 180 turn to L    16. In stance, 180 turn to R     OTHOSTATICS: not done  FUNCTIONAL GAIT: 22/30    M-CTSIB  Condition 1: Firm Surface, EO 30 Sec, Normal Sway  Condition 2: Firm Surface, EC 30 Sec, Mild Sway  Condition 3: Foam Surface, EO 30 Sec, Mild Sway  Condition 4: Foam Surface, EC 3 Sec, Severe Sway                                                                                                                               TREATMENT DATE: 01/07/24    PATIENT EDUCATION: Education details: assessment details, rationale of PT intervention, HEP initiation Person educated: Patient Education method: Explanation and Handouts Education comprehension: verbalized understanding  HOME EXERCISE PROGRAM: Access Code: [email or web address] URL: [email or web address] Date: 01/07/2024 Prepared by: Kelly Jai Bear  Exercises - Corner Balance Feet Together With Eyes Closed  - 3 sets - 30 sec hold - Corner Balance Feet Together: Eyes Open With Head Turns  - 1 x daily - 7 x weekly - 3 sets - 30 sec hold - Corner Balance Feet Together: Eyes Closed With Head Turns  - 1 x daily - 7 x weekly - 3 sets - 30 sec hold  GOALS: Goals reviewed with patient? Yes  SHORT TERM GOALS: Target date: same as LTG   LONG TERM GOALS: Target date: 02/11/2024    The patient will be independent with HEP for gaze adaptation, habituation, balance, and general mobility. Baseline:  Goal status: IN PROGRESS  2.  Improve postural control per mild sway x 30 sec condition 4 M-CTSIB Baseline: severe x 3 sec Goal status: IN PROGRESS  3.  Improve balance and reduce fall risk per score 25/30 Functional Gait Assessment Baseline: 22/30 Goal status: IN PROGRESS  4.  Report improved symptoms per score 0/100 Dizziness Handicap Inventory Baseline: 12/100 Goal status: IN PROGRESS   ASSESSMENT:  CLINICAL IMPRESSION: Pt reports having increased symptoms today being a bad day where  baseline dizziness/lightheadedness increased.  Engaged in high intensity aerobic exercise to see if the vigorous activity would help abate symptoms with great exertion applied and notes minimal change to symptoms.  Improved postural control with m-CTSIB demo moderate sway x 20 sec condition 4 vs previous 3 sec severe.  Pt with questions regarding what vestibular rehab is  a does with discussion regarding timeline and rationale of activities/exercises .   OBJECTIVE IMPAIRMENTS: decreased balance, decreased endurance, difficulty walking, and dizziness.   ACTIVITY LIMITATIONS: carrying, lifting, bending, standing, and locomotion level  PARTICIPATION LIMITATIONS: meal prep, cleaning, laundry, driving, shopping, community activity, and yard work  PERSONAL FACTORS: Age, Time since onset of injury/illness/exacerbation, and 1-2 comorbidities: PMH including COVID, DM, depression are also affecting patient's functional outcome.   REHAB POTENTIAL: Good  CLINICAL DECISION MAKING: Evolving/moderate complexity  EVALUATION COMPLEXITY: Moderate   PLAN:  PT FREQUENCY: 1x/week  PT DURATION: other: 5 weeks  PLANNED INTERVENTIONS: 97750- Physical Performance Testing, 97110-Therapeutic exercises, 97530- Therapeutic activity, [id number]- Neuromuscular re-education, 97535- Self Care, [id number]- Manual therapy, [id number]- Gait training, [phone]- Canalith repositioning, [id number]- Aquatic Therapy, [phone] (1-2 muscles), [id number] (3+ muscles)- Dry Needling, and Patient/Family education  PLAN FOR NEXT SESSION: HEP review, habituation to forward bending, motion sensitivities?, difficulty with tandem balance   4:08 PM, 01/22/2024 M. Kelly Alexxander Kurt, PT, DPT Physical Therapist- [place] Office Number: [phone]     "

## 2024-01-26 ENCOUNTER — Other Ambulatory Visit: Payer: Self-pay | Admitting: Physician Assistant

## 2024-01-29 ENCOUNTER — Ambulatory Visit: Attending: Otolaryngology

## 2024-01-29 DIAGNOSIS — R42 Dizziness and giddiness: Secondary | ICD-10-CM | POA: Insufficient documentation

## 2024-01-29 DIAGNOSIS — R2681 Unsteadiness on feet: Secondary | ICD-10-CM | POA: Diagnosis present

## 2024-01-29 NOTE — Therapy (Signed)
 " OUTPATIENT PHYSICAL THERAPY VESTIBULAR TREATMENT     Patient Name: Antonio [name] MRN: [id number] DOB:[birth year], 84 y.o., male Today's Date: 01/29/2024  END OF SESSION:  PT End of Session - 01/29/24 1449     Visit Number 4    Number of Visits 5    Date for Recertification  02/11/24    Authorization Type [employer]    PT Start Time 1448    PT Stop Time 1530    PT Time Calculation (min) 42 min    Equipment Utilized During Treatment Gait belt    Activity Tolerance Patient tolerated treatment well    Behavior During Therapy WFL for tasks assessed/performed           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   Chronic back pain    Depression    Diabetes mellitus without complication (HCC)    not on meds    DNR (do not resuscitate) 03/30/2020   GERD (gastroesophageal reflux disease)    Heart murmur    History of carpal tunnel syndrome    Bilateral   Hyperlipidemia    borderline   Hypertension    Leg edema, right    OSA on CPAP    RBBB 01/11/2016   Noted on EKG   Past Surgical History:  Procedure Laterality Date   CARPAL TUNNEL RELEASE Bilateral    CHOLECYSTECTOMY N/A 03/31/2020   Procedure: LAPAROSCOPIC CHOLECYSTECTOMY WITH INTRAOPERATIVE CHOLANGIOGRAM, TAP BLOCK;  Surgeon: Gladis Cough, MD;  Location: [facility];  Service: General;  Laterality: N/A;   INGUINAL HERNIA REPAIR N/A 03/11/2020   Procedure: LAPAROSCOPIC BILATERAL INGUINAL HERNIA REPAIR WITH MESH, TAP BLOCK;  Surgeon: Sheldon Standing, MD;  Location: [facility];  Service: General;  Laterality: N/A;   LUMBAR LAMINECTOMY/DECOMPRESSION MICRODISCECTOMY  09/06/2011   Procedure: LUMBAR LAMINECTOMY/DECOMPRESSION MICRODISCECTOMY 1 LEVEL;  Surgeon: Lamar [name] Peaches, MD;  Location: [facility];  Service: Neurosurgery;  Laterality: Left;  Left lumbar three-four Lumbar  Laminotomy/microdiskectomy   LUMBAR LAMINECTOMY/DECOMPRESSION MICRODISCECTOMY Left 12/26/2012   Procedure: LUMBAR LAMINECTOMY/DECOMPRESSION MICRODISCECTOMY LEFT LUMBAR THREE-FOUR ;  Surgeon: Lamar [name] Peaches, MD;  Location: [facility];  Service: Neurosurgery;  Laterality: Left;   POLYPECTOMY     removed small intestine   REVERSE SHOULDER ARTHROPLASTY Left 07/11/2019   Procedure: REVERSE SHOULDER ARTHROPLASTY;  Surgeon: Kay Kemps, MD;  Location: [facility];  Service: Orthopedics;  Laterality: Left;  interscalene block   ROTATOR CUFF REPAIR Left    TONSILLECTOMY     Patient Active Problem List   Diagnosis Date Noted   Chronic rhinitis 10/05/2023   Deviated nasal septum 10/05/2023   Hypertrophy of nasal turbinates 10/05/2023   Dizziness 10/05/2023   Tinnitus of both ears 10/05/2023   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   Hyperbilirubinemia 03/30/2020   Nausea & vomiting 03/30/2020   Hypokalemia 03/30/2020   DNR (do not resuscitate) 03/30/2020   Choledocholithiasis with acute cholecystitis 03/30/2020   Gait abnormality 12/30/2019   Sensorineural hearing loss, bilateral 12/30/2019   Chronic kidney disease, stage 3a (HCC) 12/30/2019   Daytime hypersomnia 12/30/2019   Diabetic renal disease (HCC) 12/30/2019   Dyslipidemia 12/30/2019   ED (erectile dysfunction) of organic origin 12/30/2019   Hypertension 12/30/2019   Iron deficiency anemia 12/30/2019   Low back pain 12/30/2019   Memory loss 12/30/2019   Mild depression 12/30/2019  Obstructive sleep apnea syndrome 12/30/2019   Peripheral venous insufficiency 12/30/2019   Prostatic hypertrophy 12/30/2019   Recurrent major depression in remission 12/30/2019   Second degree burn of lower limb 12/30/2019   Skin sensation disturbance 12/30/2019   Vitamin D  deficiency 12/30/2019   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   Scrotal hernia - right, s/p lap repair w mesh 03/11/2020 12/30/2019   Inguinal hernia, left, s/p lap  repair w mesh 03/11/2020 12/30/2019   Diabetes (HCC) 09/13/2019   Bilateral impacted cerumen 08/01/2019   Foreign body in auricle of right ear 08/01/2019   H/O total shoulder replacement, 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   Major depressive disorder, recurrent episode, moderate (HCC) 01/06/2018   Hyperlipidemia    HNP (herniated nucleus pulposus), lumbar 12/26/2012    PCP: [facility] REFERRING PROVIDER: Karis Clunes, MD  REFERRING DIAG:  R42 (ICD-10-CM) - Dizziness    THERAPY DIAG:  Dizziness and giddiness  Unsteadiness on feet  ONSET DATE: 1 years  Rationale for Evaluation and Treatment: Rehabilitation  SUBJECTIVE:   SUBJECTIVE STATEMENT: Doing a bit better today, back to baseline of 6-7/10 of swimmyheaded/imbalance    Pt accompanied by: self  PERTINENT HISTORY: 84 y.o. male who presents today complaining of chronic dizziness for the past year.  He describes the dizziness as a lightheadedness sensation.  He denies any spinning vertigo.  He also has chronic nasal congestion and chronic rhinosinusitis.  He complains of frequent nasal drainage.  He is currently on Claritin.  He also has a history of bilateral hearing loss and tinnitus.  He wears bilateral hearing aids.  PAIN:  Are you having pain? No  PRECAUTIONS: None  RED FLAGS: None   WEIGHT BEARING RESTRICTIONS: No  FALLS: Has patient fallen in last 6 months? No  LIVING ENVIRONMENT: Lives with: lives with their family Lives in: House/apartment Stairs: split level home Has following equipment at home: None  PLOF: Independent  PATIENT GOALS:   OBJECTIVE:   Baseline 6-7/10 TODAY'S TREATMENT: 01/29/23 Activity Comments  Corner balance Addition of semi-tandem  Forward T at counter Slight elevation, not even 1-point  Single leg stance 1x10 Lift, hover, drop at cabinet  Vertical ball toss/catch No issues  VOR cancellation No issues  Multisensory static balance --firm/compliant surfaces. Static and weight shift for limits of  stability    Baseline symptoms: 15/10--today is a bad day, higher than normal  TODAY'S TREATMENT: 01/22/24 Activity Comments  Vitals: 173/84 mmHg, 81 bpm   NU-step speed intervals x 8.5 min 2 min warm-up level 6 30 sec sprint; 60 sec recovery resistance 4--good maintenance of 100+ SPM 1 min cool down 99 bpm end of trial  M-CTSIB   Pt education regarding vestibular rehab principles            M-CTSIB  Condition 1: Firm Surface, EO 30 Sec, Normal Sway  Condition 2: Firm Surface, EC 30 Sec, Mild Sway  Condition 3: Foam Surface, EO 30 Sec, Normal Sway  Condition 4: Foam Surface, EC 20 Sec, Moderate Sway       PATIENT EDUCATION: HOME EXERCISE PROGRAM Last updated: 01/18/24 Access Code: [email or web address] URL: [email or web address] Date: 01/18/2024 Prepared by: [facility] - Outpatient  Rehab - [facility]  Exercises - Corner Balance Feet Together With Eyes Closed  - 3 sets - 30 sec hold - Corner Balance Feet Together: Eyes Open With Head Turns  - 1 x daily - 7 x weekly - 3 sets - 30 sec hold - Corner Balance Feet Together: Eyes Closed With Head  Turns  - 1 x daily - 7 x weekly - 3 sets - 30 sec hold - Brandt-Daroff Vestibular Exercise  - 1 x daily - 5 x weekly - 2 sets - 3-5 reps - Seated Nose to Left Knee Vestibular Habituation  - 1 x daily - 5 x weekly - 2 sets - 3-5 reps - Seated Nose to Right Knee Vestibular Habituation  - 1 x daily - 5 x weekly - 2 sets - 3-5 reps - Semi-Tandem Corner Balance With Eyes Open  - 7 x weekly - 3 sets - 15-30 sec hold - Standing Foot Lift on Box (BKA)  - 1-3 sets - 10 reps  MOTION SENSITIVITY:  Motion Sensitivity Quotient Intensity: 0 = none, 1 = Lightheaded, 2 = Mild, 3 = Moderate, 4 = Severe, 5 = Vomiting *pt reports baseline of 6/10 dizziness which he states is lightheaded. Was instructed to indicate is sx increase some baseline.  Intensity  1. Sitting to supine 0  2. Supine to L side 0  3. Supine to R side 0  4. Supine to  sitting 0  5. L Hallpike-Dix 0  6. Up from L  1  7. R Hallpike-Dix 1  8. Up from R  1  9. Sitting, head tipped to L knee 0  10. Head up from L knee 1  11. Sitting, head tipped to R knee 0  12. Head up from R knee 1  13. Sitting head turns x5 1  14.Sitting head nods x5 0  15. In stance, 180 turn to L  0  16. In stance, 180 turn to R 0         PATIENT EDUCATION: Education details: edu on intended effects of HEP, frequency, intensity of exercises ; answered pt's questions on potential etiology of his dizziness  Person educated: Patient Education method: Explanation, Demonstration, Tactile cues, Verbal cues, and Handouts Education comprehension: verbalized understanding and returned demonstration    Note: Objective measures were completed at Evaluation unless otherwise noted.  DIAGNOSTIC FINDINGS: reports hx of MRI which were unrevealing  COGNITION: Overall cognitive status: Within functional limits for tasks assessed  POSTURE:  No Significant postural limitations  Cervical ROM:    WFL with end-range limitations all planes but not painful and asymptomatic  STRENGTH: NT    PATIENT SURVEYS:  DHI: THE DIZZINESS HANDICAP INVENTORY (DHI)  P1. Does looking up increase your problem? 0 = No  E2. Because of your problem, do you feel frustrated? 2 = Sometimes  F3. Because of your problem, do you restrict your travel for business or recreation?  0 = No  P4. Does walking down the aisle of a supermarket increase your problems?  0 = No  F5. Because of your problem, do you have difficulty getting into or out of bed?  0 = No  F6. Does your problem significantly restrict your participation in social activities, such as going out to dinner, going to the movies, dancing, or going to parties? 0 = No  F7. Because of your problem, do you have difficulty reading?  0 = No  P8. Does performing more ambitious activities such as sports, dancing, household chores (sweeping or putting dishes  away) increase your problems?  0 = No  E9. Because of your problem, are you afraid to leave your home without having without having someone accompany you?  2 = Sometimes  E10. Because of your problem have you been embarrassed in front of others?  0 = No  P11. Do quick movements of your head increase your problem?  0 = No  F12. Because of your problem, do you avoid heights?  0 = No  P13. Does turning over in bed increase your problem?  0 = No  F14. Because of your problem, is it difficult for you to do strenuous homework or yard work? 2 = Sometimes  E15. Because of your problem, are you afraid people may think you are intoxicated? 0 = No  F16. Because of your problem, is it difficult for you to go for a walk by yourself?  0 = No  P17. Does walking down a sidewalk increase your problem?  0 = No  E18.Because of your problem, is it difficult for you to concentrate 0 = No  F19. Because of your problem, is it difficult for you to walk around your house in the dark? 0 = No  E20. Because of your problem, are you afraid to stay home alone?  0 = No  E21. Because of your problem, do you feel handicapped? 0 = No  E22. Has the problem placed stress on your relationships with members of your family or friends? 2 = Sometimes  E23. Because of your problem, are you depressed?  2 = Sometimes  F24. Does your problem interfere with your job or household responsibilities?  0 = No  P25. Does bending over increase your problem?  2 = Sometimes  TOTAL 12    DHI Scoring Instructions  The patient is asked to answer each question as it pertains to dizziness or unsteadiness problems, specifically  considering their condition during the last month. Questions are designed to incorporate functional (F), physical  (P), and emotional (E) impacts on disability.   Scores greater than 10 points should be referred to balance specialists for further evaluation.   16-34 Points (mild handicap)  36-52 Points (moderate handicap)   54+ Points (severe handicap)  Minimally Detectable Change: 17 points ([address] [place], 1990)  [place], G. SHAUNNA. and [place], C. W. (1990). The development of the Dizziness Handicap Inventory. Archives of Otolaryngology - Head and Neck Surgery 116(4): [id number].   VESTIBULAR ASSESSMENT:  GENERAL OBSERVATION: wears bifocals   SYMPTOM BEHAVIOR:  Subjective history: 1+ year hx of lightheadedness, imbalance, mental fog and reports hx of COVID x 2 when symptoms initially appeared  Non-Vestibular symptoms: none noted other than head/sinus congestion  Type of dizziness: Imbalance (Disequilibrium) and Lightheadedness/Faint  Frequency: constant  Duration: exacerbations last 1-2 hr  Aggravating factors: No known aggravating factors and    Relieving factors: rest  Progression of symptoms: unchanged  OCULOMOTOR EXAM:  Ocular Alignment: normal  Ocular ROM: No Limitations  Spontaneous Nystagmus: absent  Gaze-Induced Nystagmus: age appropriate nystagmus at end range  Smooth Pursuits: saccadic intrusions vs coordination?  Saccades: hypermetric/overshoots  Convergence/Divergence: 6 cm   Cover-cross-cover test: Normal    VESTIBULAR - OCULAR REFLEX:   Slow VOR: Normal  VOR Cancellation: Normal  Head-Impulse Test: difficult to assess due to guarding neck movements  Dynamic Visual Acuity: Not able to be assessed   POSITIONAL TESTING: Other: denies any positional dizziness  MOTION SENSITIVITY:  Motion Sensitivity Quotient Intensity: 0 = none, 1 = Lightheaded, 2 = Mild, 3 = Moderate, 4 = Severe, 5 = Vomiting  Intensity  1. Sitting to supine   2. Supine to L side   3. Supine to R side   4. Supine to sitting   5. L Hallpike-Dix   6. Up from L  7. R Hallpike-Dix   8. Up from R    9. Sitting, head tipped to L knee   10. Head up from L knee   11. Sitting, head tipped to R knee   12. Head up from R knee   13. Sitting head turns x5   14.Sitting head nods x5   15. In stance, 180 turn to  L    16. In stance, 180 turn to R     OTHOSTATICS: not done  FUNCTIONAL GAIT: 22/30    M-CTSIB  Condition 1: Firm Surface, EO 30 Sec, Normal Sway  Condition 2: Firm Surface, EC 30 Sec, Mild Sway  Condition 3: Foam Surface, EO 30 Sec, Mild Sway  Condition 4: Foam Surface, EC 3 Sec, Severe Sway                                                                                                                               TREATMENT DATE: 01/07/24    GOALS: Goals reviewed with patient? Yes  SHORT TERM GOALS: Target date: same as LTG   LONG TERM GOALS: Target date: 02/11/2024    The patient will be independent with HEP for gaze adaptation, habituation, balance, and general mobility. Baseline:  Goal status: IN PROGRESS  2.  Improve postural control per mild sway x 30 sec condition 4 M-CTSIB Baseline: severe x 3 sec Goal status: IN PROGRESS  3.  Improve balance and reduce fall risk per score 25/30 Functional Gait Assessment Baseline: 22/30 Goal status: IN PROGRESS  4.  Report improved symptoms per score 0/100 Dizziness Handicap Inventory Baseline: 12/100 Goal status: IN PROGRESS   ASSESSMENT:  CLINICAL IMPRESSION: Good recall to HEP and exhibits improved performance to static multisensory balance yet again! M-CTSIB condition 4 mid sway x 30 sec maintaining good control throughout.  Progressed HEP development for additional balance activities with emphasis on single limb support as he notes difficulty with stepping over obstacles and stair negotiation.  Balance activities reveal difficulty with weight shift and righting reactions espiecally to anterior-posterior direction with delay in stepping/hip strategy noted relying on UE support to correct.  Habituation seems to be having a good effect as he notes minimal symptoms provoked with nose to knee forward flexion or with Brandt-Daroff movement as well.  Anticipate additional session to refine and provide relevant progressions for  home performance .   OBJECTIVE IMPAIRMENTS: decreased balance, decreased endurance, difficulty walking, and dizziness.   ACTIVITY LIMITATIONS: carrying, lifting, bending, standing, and locomotion level  PARTICIPATION LIMITATIONS: meal prep, cleaning, laundry, driving, shopping, community activity, and yard work  PERSONAL FACTORS: Age, Time since onset of injury/illness/exacerbation, and 1-2 comorbidities: PMH including COVID, DM, depression are also affecting patient's functional outcome.   REHAB POTENTIAL: Good  CLINICAL DECISION MAKING: Evolving/moderate complexity  EVALUATION COMPLEXITY: Moderate   PLAN:  PT FREQUENCY: 1x/week  PT DURATION: other: 5 weeks  PLANNED INTERVENTIONS: 97750- Physical Performance Testing, 97110-Therapeutic exercises, 97530- Therapeutic activity,  02887- Neuromuscular re-education, [id number]- Self Care, 02859- Manual therapy, [id number]- Gait training, [id number]- Canalith repositioning, [id number]- Aquatic Therapy, [phone] (1-2 muscles), 20561 (3+ muscles)- Dry Needling, and Patient/Family education  PLAN FOR NEXT SESSION: HEP review, habituation to forward bending, motion sensitivities?, difficulty with tandem balance   2:49 PM, 01/29/2024 M. Kelly Guerline Happ, PT, DPT Physical Therapist- [place] Office Number: [phone]     "

## 2024-01-30 NOTE — Addendum Note (Signed)
 Addended by: Lucion Dilger T on: 01/30/2024 05:08 PM   Modules accepted: Level of Service

## 2024-02-01 NOTE — Addendum Note (Signed)
 Addended by: Eldene Plocher T on: 02/01/2024 10:37 AM   Modules accepted: Level of Service

## 2024-02-05 ENCOUNTER — Ambulatory Visit

## 2024-02-05 DIAGNOSIS — R42 Dizziness and giddiness: Secondary | ICD-10-CM

## 2024-02-05 DIAGNOSIS — R2681 Unsteadiness on feet: Secondary | ICD-10-CM

## 2024-02-05 NOTE — Therapy (Signed)
 " OUTPATIENT PHYSICAL THERAPY VESTIBULAR TREATMENT and D/C Summary     Patient Name: Antonio [name] MRN: [id number] DOB:Sep 29, 1940, 84 y.o., male Today's Date: 02/05/2024  PHYSICAL THERAPY DISCHARGE SUMMARY  Visits from Start of Care: 5  Current functional level related to goals / functional outcomes: Improved score to Functional Gait Assessment 25/30 indicating low risk for falls M-CTSIB normal-mild condition 3/4 Symptom report per DHI 12 at start of care 14 at end of care   Remaining deficits: Symptoms of lightheadedness/imbalance   Education / Equipment: HEP and pt education   Patient agrees to discharge. Patient goals were partially met. Patient is being discharged due to being pleased with the current functional level.  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   Chronic back pain    Depression    Diabetes mellitus without complication (HCC)    not on meds    DNR (do not resuscitate) 03/30/2020   GERD (gastroesophageal reflux disease)    Heart murmur    History of carpal tunnel syndrome    Bilateral   Hyperlipidemia    borderline   Hypertension    Leg edema, right    OSA on CPAP    RBBB 01/11/2016   Noted on EKG   Past Surgical History:  Procedure Laterality Date   CARPAL TUNNEL RELEASE Bilateral    CHOLECYSTECTOMY N/A 03/31/2020   Procedure: LAPAROSCOPIC CHOLECYSTECTOMY WITH INTRAOPERATIVE  CHOLANGIOGRAM, TAP BLOCK;  Surgeon: Gladis Cough, MD;  Location: [facility];  Service: General;  Laterality: N/A;   INGUINAL HERNIA REPAIR N/A 03/11/2020   Procedure: LAPAROSCOPIC BILATERAL INGUINAL HERNIA REPAIR WITH MESH, TAP BLOCK;  Surgeon: Sheldon Standing, MD;  Location: [facility];  Service: General;  Laterality: N/A;   LUMBAR LAMINECTOMY/DECOMPRESSION MICRODISCECTOMY  09/06/2011   Procedure: LUMBAR LAMINECTOMY/DECOMPRESSION MICRODISCECTOMY 1 LEVEL;  Surgeon: Lamar [name] Peaches, MD;  Location: [facility];  Service: Neurosurgery;  Laterality: Left;  Left lumbar three-four Lumbar Laminotomy/microdiskectomy   LUMBAR LAMINECTOMY/DECOMPRESSION MICRODISCECTOMY Left 12/26/2012   Procedure: LUMBAR LAMINECTOMY/DECOMPRESSION MICRODISCECTOMY LEFT LUMBAR THREE-FOUR ;  Surgeon: Lamar [name] Peaches, MD;  Location: [facility];  Service: Neurosurgery;  Laterality: Left;   POLYPECTOMY     removed small intestine   REVERSE SHOULDER ARTHROPLASTY Left 07/11/2019   Procedure: REVERSE SHOULDER ARTHROPLASTY;  Surgeon: Kay Kemps, MD;  Location: [facility];  Service: Orthopedics;  Laterality: Left;  interscalene block   ROTATOR CUFF REPAIR Left    TONSILLECTOMY     Patient Active Problem List   Diagnosis Date Noted   Chronic rhinitis 10/05/2023   Deviated nasal septum 10/05/2023   Hypertrophy of nasal turbinates 10/05/2023   Dizziness 10/05/2023   Tinnitus of both ears 10/05/2023   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   Hyperbilirubinemia 03/30/2020  Nausea & vomiting 03/30/2020   Hypokalemia 03/30/2020   DNR (do not resuscitate) 03/30/2020   Choledocholithiasis with acute cholecystitis 03/30/2020   Gait abnormality 12/30/2019   Sensorineural hearing loss, bilateral 12/30/2019   Chronic kidney disease, stage 3a (HCC) 12/30/2019   Daytime hypersomnia 12/30/2019   Diabetic renal disease (HCC) 12/30/2019   Dyslipidemia 12/30/2019   ED (erectile dysfunction) of organic origin 12/30/2019   Hypertension 12/30/2019    Iron deficiency anemia 12/30/2019   Low back pain 12/30/2019   Memory loss 12/30/2019   Mild depression 12/30/2019   Obstructive sleep apnea syndrome 12/30/2019   Peripheral venous insufficiency 12/30/2019   Prostatic hypertrophy 12/30/2019   Recurrent major depression in remission 12/30/2019   Second degree burn of lower limb 12/30/2019   Skin sensation disturbance 12/30/2019   Vitamin D  deficiency 12/30/2019   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   Scrotal hernia - right, s/p lap repair w mesh 03/11/2020 12/30/2019   Inguinal hernia, left, s/p lap repair w mesh 03/11/2020 12/30/2019   Diabetes (HCC) 09/13/2019   Bilateral impacted cerumen 08/01/2019   Foreign body in auricle of right ear 08/01/2019   H/O total shoulder replacement, 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   Major depressive disorder, recurrent episode, moderate (HCC) 01/06/2018   Hyperlipidemia    HNP (herniated nucleus pulposus), lumbar 12/26/2012    PCP: [facility] REFERRING PROVIDER: Karis Clunes, MD  REFERRING DIAG:  R42 (ICD-10-CM) - Dizziness    THERAPY DIAG:  Dizziness and giddiness  Unsteadiness on feet  ONSET DATE: 1 years  Rationale for Evaluation and Treatment: Rehabilitation  SUBJECTIVE:   SUBJECTIVE STATEMENT: Feel about the same overall.  Feeling 7/10 lightheaded/pressure around temples. Denies any HA, no photo/phonophobia.     Pt accompanied by: self  PERTINENT HISTORY: 84 y.o. male who presents today complaining of chronic dizziness for the past year.  He describes the dizziness as a lightheadedness sensation.  He denies any spinning vertigo.  He also has chronic nasal congestion and chronic rhinosinusitis.  He complains of frequent nasal drainage.  He is currently on Claritin.  He also has a history of bilateral hearing loss and tinnitus.  He wears bilateral hearing aids.  PAIN:  Are you having pain? No  PRECAUTIONS: None  RED FLAGS: None   WEIGHT BEARING RESTRICTIONS: No  FALLS: Has  patient fallen in last 6 months? No  LIVING ENVIRONMENT: Lives with: lives with their family Lives in: House/apartment Stairs: split level home Has following equipment at home: None  PLOF: Independent  PATIENT GOALS:   OBJECTIVE:   TODAY'S TREATMENT: 02/05/24 Activity Comments  7/10 symptoms   DHI: 14/100   M-CTSIB: condition 3-4: normal-mild   FGA   HEP review         [facility] PT Assessment - 02/05/24 0001       Functional Gait  Assessment   Gait assessed  Yes    Gait Level Surface Walks 20 ft in less than 7 sec but greater than 5.5 sec, uses assistive device, slower speed, mild gait deviations, or deviates 6-10 in outside of the 12 in walkway width.    Change in Gait Speed Able to smoothly change walking speed without loss of balance or gait deviation. Deviate no more than 6 in outside of the 12 in walkway width.    Gait with Horizontal Head Turns Performs head turns smoothly with no change in gait. Deviates no more than 6 in outside 12 in walkway width    Gait with Vertical Head Turns Performs  head turns with no change in gait. Deviates no more than 6 in outside 12 in walkway width.    Gait and Pivot Turn Pivot turns safely within 3 sec and stops quickly with no loss of balance.    Step Over Obstacle Is able to step over 2 stacked shoe boxes taped together (9 in total height) without changing gait speed. No evidence of imbalance.    Gait with Narrow Base of Support Ambulates 4-7 steps.    Gait with Eyes Closed Walks 20 ft, uses assistive device, slower speed, mild gait deviations, deviates 6-10 in outside 12 in walkway width. Ambulates 20 ft in less than 9 sec but greater than 7 sec.    Ambulating Backwards Walks 20 ft, uses assistive device, slower speed, mild gait deviations, deviates 6-10 in outside 12 in walkway width.    Steps Alternating feet, no rail.    Total Score 25               PATIENT EDUCATION: HOME EXERCISE PROGRAM Last updated: 01/18/24 Access Code:  [email or web address] URL: [email or web address] Date: 01/18/2024 Prepared by: [facility] - Outpatient  Rehab - [facility]  Exercises - Corner Balance Feet Together With Eyes Closed  - 3 sets - 30 sec hold - Corner Balance Feet Together: Eyes Open With Head Turns  - 1 x daily - 7 x weekly - 3 sets - 30 sec hold - Corner Balance Feet Together: Eyes Closed With Head Turns  - 1 x daily - 7 x weekly - 3 sets - 30 sec hold - Brandt-Daroff Vestibular Exercise  - 1 x daily - 5 x weekly - 2 sets - 3-5 reps - Seated Nose to Left Knee Vestibular Habituation  - 1 x daily - 5 x weekly - 2 sets - 3-5 reps - Seated Nose to Right Knee Vestibular Habituation  - 1 x daily - 5 x weekly - 2 sets - 3-5 reps - Semi-Tandem Corner Balance With Eyes Open  - 7 x weekly - 3 sets - 15-30 sec hold - Standing Foot Lift on Box (BKA)  - 1-3 sets - 10 reps  MOTION SENSITIVITY:  Motion Sensitivity Quotient Intensity: 0 = none, 1 = Lightheaded, 2 = Mild, 3 = Moderate, 4 = Severe, 5 = Vomiting *pt reports baseline of 6/10 dizziness which he states is lightheaded. Was instructed to indicate is sx increase some baseline.  Intensity  1. Sitting to supine 0  2. Supine to L side 0  3. Supine to R side 0  4. Supine to sitting 0  5. L Hallpike-Dix 0  6. Up from L  1  7. R Hallpike-Dix 1  8. Up from R  1  9. Sitting, head tipped to L knee 0  10. Head up from L knee 1  11. Sitting, head tipped to R knee 0  12. Head up from R knee 1  13. Sitting head turns x5 1  14.Sitting head nods x5 0  15. In stance, 180 turn to L  0  16. In stance, 180 turn to R 0         PATIENT EDUCATION: Education details: edu on intended effects of HEP, frequency, intensity of exercises ; answered pt's questions on potential etiology of his dizziness  Person educated: Patient Education method: Explanation, Demonstration, Tactile cues, Verbal cues, and Handouts Education comprehension: verbalized understanding and returned  demonstration    Note: Objective measures were completed at Evaluation unless otherwise noted.  DIAGNOSTIC FINDINGS:  reports hx of MRI which were unrevealing  COGNITION: Overall cognitive status: Within functional limits for tasks assessed  POSTURE:  No Significant postural limitations  Cervical ROM:    WFL with end-range limitations all planes but not painful and asymptomatic  STRENGTH: NT    PATIENT SURVEYS:  DHI: THE DIZZINESS HANDICAP INVENTORY (DHI)  P1. Does looking up increase your problem? 0 = No  E2. Because of your problem, do you feel frustrated? 2 = Sometimes  F3. Because of your problem, do you restrict your travel for business or recreation?  0 = No  P4. Does walking down the aisle of a supermarket increase your problems?  0 = No  F5. Because of your problem, do you have difficulty getting into or out of bed?  0 = No  F6. Does your problem significantly restrict your participation in social activities, such as going out to dinner, going to the movies, dancing, or going to parties? 0 = No  F7. Because of your problem, do you have difficulty reading?  0 = No  P8. Does performing more ambitious activities such as sports, dancing, household chores (sweeping or putting dishes away) increase your problems?  0 = No  E9. Because of your problem, are you afraid to leave your home without having without having someone accompany you?  2 = Sometimes  E10. Because of your problem have you been embarrassed in front of others?  0 = No  P11. Do quick movements of your head increase your problem?  0 = No  F12. Because of your problem, do you avoid heights?  0 = No  P13. Does turning over in bed increase your problem?  0 = No  F14. Because of your problem, is it difficult for you to do strenuous homework or yard work? 2 = Sometimes  E15. Because of your problem, are you afraid people may think you are intoxicated? 0 = No  F16. Because of your problem, is it difficult for you to go  for a walk by yourself?  0 = No  P17. Does walking down a sidewalk increase your problem?  0 = No  E18.Because of your problem, is it difficult for you to concentrate 0 = No  F19. Because of your problem, is it difficult for you to walk around your house in the dark? 0 = No  E20. Because of your problem, are you afraid to stay home alone?  0 = No  E21. Because of your problem, do you feel handicapped? 0 = No  E22. Has the problem placed stress on your relationships with members of your family or friends? 2 = Sometimes  E23. Because of your problem, are you depressed?  2 = Sometimes  F24. Does your problem interfere with your job or household responsibilities?  0 = No  P25. Does bending over increase your problem?  2 = Sometimes  TOTAL 12    DHI Scoring Instructions  The patient is asked to answer each question as it pertains to dizziness or unsteadiness problems, specifically  considering their condition during the last month. Questions are designed to incorporate functional (F), physical  (P), and emotional (E) impacts on disability.   Scores greater than 10 points should be referred to balance specialists for further evaluation.   16-34 Points (mild handicap)  36-52 Points (moderate handicap)  54+ Points (severe handicap)  Minimally Detectable Change: 17 points ([address] [place], 1990)  Everett, G. SHAUNNA. and Vandemere, C. W. (1990). The development  of the Dizziness Handicap Inventory. Archives of Otolaryngology - Head and Neck Surgery 116(4): [id number].   VESTIBULAR ASSESSMENT:  GENERAL OBSERVATION: wears bifocals   SYMPTOM BEHAVIOR:  Subjective history: 1+ year hx of lightheadedness, imbalance, mental fog and reports hx of COVID x 2 when symptoms initially appeared  Non-Vestibular symptoms: none noted other than head/sinus congestion  Type of dizziness: Imbalance (Disequilibrium) and Lightheadedness/Faint  Frequency: constant  Duration: exacerbations last 1-2 hr  Aggravating  factors: No known aggravating factors and    Relieving factors: rest  Progression of symptoms: unchanged  OCULOMOTOR EXAM:  Ocular Alignment: normal  Ocular ROM: No Limitations  Spontaneous Nystagmus: absent  Gaze-Induced Nystagmus: age appropriate nystagmus at end range  Smooth Pursuits: saccadic intrusions vs coordination?  Saccades: hypermetric/overshoots  Convergence/Divergence: 6 cm   Cover-cross-cover test: Normal    VESTIBULAR - OCULAR REFLEX:   Slow VOR: Normal  VOR Cancellation: Normal  Head-Impulse Test: difficult to assess due to guarding neck movements  Dynamic Visual Acuity: Not able to be assessed   POSITIONAL TESTING: Other: denies any positional dizziness  MOTION SENSITIVITY:  Motion Sensitivity Quotient Intensity: 0 = none, 1 = Lightheaded, 2 = Mild, 3 = Moderate, 4 = Severe, 5 = Vomiting  Intensity  1. Sitting to supine   2. Supine to L side   3. Supine to R side   4. Supine to sitting   5. L Hallpike-Dix   6. Up from L    7. R Hallpike-Dix   8. Up from R    9. Sitting, head tipped to L knee   10. Head up from L knee   11. Sitting, head tipped to R knee   12. Head up from R knee   13. Sitting head turns x5   14.Sitting head nods x5   15. In stance, 180 turn to L    16. In stance, 180 turn to R     OTHOSTATICS: not done  FUNCTIONAL GAIT: 22/30    M-CTSIB  Condition 1: Firm Surface, EO 30 Sec, Normal Sway  Condition 2: Firm Surface, EC 30 Sec, Mild Sway  Condition 3: Foam Surface, EO 30 Sec, Mild Sway  Condition 4: Foam Surface, EC 3 Sec, Severe Sway                                                                                                                               TREATMENT DATE: 01/07/24    GOALS: Goals reviewed with patient? Yes  SHORT TERM GOALS: Target date: same as LTG   LONG TERM GOALS: Target date: 02/11/2024    The patient will be independent with HEP for gaze adaptation, habituation, balance, and general  mobility. Baseline:  Goal status: MET  2.  Improve postural control per mild sway x 30 sec condition 4 M-CTSIB Baseline: severe x 3 sec; normal-mild x 30 Goal status: MET  3.  Improve balance and reduce fall risk per score 25/30  Functional Gait Assessment Baseline: 22/30; 25/30 Goal status: MET  4.  Report improved symptoms per score 0/100 Dizziness Handicap Inventory Baseline: 12/100; 14/100 Goal status: NOT MET   ASSESSMENT:  CLINICAL IMPRESSION: Reports symptoms are largely the same and persist with similar report on DHI at start of care to D/C (12% and 14% respectively).  Improved postural control evident with M-CTSIB exhibiting normal-mild sway x 30 sec but reports perception of severe postural sway/LOB.  Functional Gait Assesment reveals improved performance from 22 to 25/30 indicating reduced risk for falls with improved ability to perform such tasks as tandem walk (initially, could not bring feet to midline) and improved ability to negotiate stairs with reciprocal pattern without HR.  Good recall to HEP and notes the positional habituation (Brandt-Daroff) activities are no longer provocative and reduced intensity of symptoms with forward bending.  Interestingly, he notes if he is engaged with an activity or driving his symptoms are largely absent/unnoticed.  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  Pt feels confident in self-management at this time and will D/C to HEP .   OBJECTIVE IMPAIRMENTS: decreased balance, decreased endurance, difficulty walking, and dizziness.   ACTIVITY LIMITATIONS: carrying, lifting, bending, standing, and locomotion level  PARTICIPATION LIMITATIONS: meal prep, cleaning, laundry, driving, shopping, community activity, and yard work  PERSONAL FACTORS: Age, Time since onset of injury/illness/exacerbation, and 1-2  comorbidities: PMH including COVID, DM, depression are also affecting patient's functional outcome.   REHAB POTENTIAL: Good  CLINICAL DECISION MAKING: Evolving/moderate complexity  EVALUATION COMPLEXITY: Moderate   PLAN:  PT FREQUENCY: 1x/week  PT DURATION: other: 5 weeks  PLANNED INTERVENTIONS: 97750- Physical Performance Testing, 97110-Therapeutic exercises, 97530- Therapeutic activity, [id number]- Neuromuscular re-education, 97535- Self Care, [id number]- Manual therapy, [id number]- Gait training, [phone]- Canalith repositioning, [id number]- Aquatic Therapy, [phone] (1-2 muscles), [id number] (3+ muscles)- Dry Needling, and Patient/Family education  PLAN FOR NEXT SESSION: HEP review, habituation to forward bending, motion sensitivities?, difficulty with tandem balance   2:49 PM, 02/05/2024 M. Kelly Omari Mcmanaway, PT, DPT Physical Therapist- [place] Office Number: [phone]     "

## 2024-02-08 ENCOUNTER — Ambulatory Visit: Admitting: Psychiatry

## 2024-02-08 DIAGNOSIS — E1165 Type 2 diabetes mellitus with hyperglycemia: Secondary | ICD-10-CM | POA: Diagnosis not present

## 2024-02-08 DIAGNOSIS — F3341 Major depressive disorder, recurrent, in partial remission: Secondary | ICD-10-CM

## 2024-02-08 DIAGNOSIS — Z630 Problems in relationship with spouse or partner: Secondary | ICD-10-CM | POA: Diagnosis not present

## 2024-02-08 DIAGNOSIS — F5104 Psychophysiologic insomnia: Secondary | ICD-10-CM | POA: Diagnosis not present

## 2024-02-08 DIAGNOSIS — Z72821 Inadequate sleep hygiene: Secondary | ICD-10-CM

## 2024-02-08 NOTE — Progress Notes (Signed)
 Psychotherapy Progress Note [facility] Jodie Kendall, PhD LP  Patient ID: Antonio [name] Estevan Kersh)    MRN: [id number] Therapy format: Family therapy w/ patient -- accompanied by [name] Ask Date: 02/08/2024      Start: 2:06p     Stop: 2:56p     Time Spent: 50 min Location: In-person   Session narrative (presenting needs, interim history, self-report of stressors and symptoms, applications of prior therapy, status changes, and interventions made in session) Says he's had a bad time of it until today.  Did make it to Bible study yesterday.  Ask says he continues very irritable she can hardly stand it.  Probed about the project last time to get on dog care after supper and clear time in the evening for recreation and/or togetherness, Ask makes sure to say he only gave it 2 days and gave up.  Says for himself he forgets, it's hard to pull off but reframed the challenge as opportunity and encouraged him to get back on the horse and try it again.  Bending a habit is about repeatedly restarting the new, better thing until it comes easily.  Challenged to pull the cord again and let tonight be next stab at starting dog care after dinner.    Addressing irritability, again recommended do-overs as positive practice and probed what he could do with a recent conflict.  Generally, advocated for taking actual care of actual bodily problems, too, like pain and congestion to bring down triggers to irritability, while validating that these would tend to make anyone less patient with other things.  Says he has had some vestibular PT, which works some for his dizziness.    [name] presses to go back into why their sex life failed early and why he's not interested now, asserting that we keep not returning to the subject.  Cautioned against garbage-canning work done on day to day issues in the quest to voice everything she's dissatisfied with, and keep some room for catching and rewarding  incremental improvements, especially at his age.  Reviewed past interpretations why [name] let intimacy fail and challenged both to focus on the positive and on incremental progress.  Therapeutic modalities: Cognitive Behavioral Therapy, Solution-Oriented/Positive Psychology, Ego-Supportive, and Assertiveness/Communication  Mental Status/Observations:  Appearance:   Casual     Behavior:  Appropriate  Motor:  Stiff gait  Speech/Language:   Clear and Coherent  Affect:  Appropriate  Mood:  dysthymic and responsive  Thought process:  concrete  Thought content:    WNL  Sensory/Perceptual disturbances:    WNL  Orientation:  Fully oriented  Attention:  Good    Concentration:  Fair  Memory:  WNL  Insight:    Variable  Judgment:   Good  Impulse Control:  Fair   Risk Assessment: Danger to Self: No Self-injurious Behavior: No Danger to Others: No Physical Aggression / Violence: No Duty to Warn: No Access to Firearms a concern: No  Assessment of progress:  progressing  Diagnosis:   ICD-10-CM   1. 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 Inadequate sleep hygiene  Z72.821     4. Relationship problem between partners  Z63.0     5. Type 2 diabetes mellitus with hyperglycemia, unspecified whether long term insulin  use (HCC)  E11.65      Plan:  Sleep hygiene and energy regulation Firm caffeine curfew 4pm -- all forms, reliably Carb curfew  before bed, OK to snack earlier Turn the lights down about 9pm, bring up light as early in the morning as possible Go on to bed by midnight, no matter what Rise by 10am if at all possible Make sure CPAP is adjusted, clean, and maintained correctly.  Recheck need if desired. Enable earlier bed time by pressing through dog care tasks May try melatonin 1-2 hrs before sleep, 3-5mg .  At least by 10:30pm. Practice 5-7 big, deep breaths in the morning and anytime he needs a refresher, or  suspects underbreathing Communication/conflict/relationship Keep trying to get ahead of temper -- say you feel frustrated rather than just play it out.  Options to growl, time out, or something else first. Encourage use of do overs instead of confront and defend dynamics If able, practice saying I'm frustrated or ask what [name] means before reacting to what he believes he hears from her Continue general approach trying to be kinder on the first reaction, giving grace first with [place], don't make it have anything to do with how he feels like he's being treated For encouragement's sake, if desired, ask [name] if she can tell when he is trying Look for opportunities to establish a little quality time together, e.g., shared TV or music, with just touching lightly to get used to it again, e.g., hold hands Pull out old favorite music and activities, including asking [name] to play cool jazz, to help with mood and possibly share [name] make priority of asking/getting [name]'s attention before expecting it -- he is older, sleep-disordered, and long in the habit of self-absorption, plus it's good form anyway.  Likewise try to keep instructions, commentaries, and complaints to few points at once -- he is more likely to react if feeling flooded.  Take into account Pt's age, circadian rhythm problem, chronic difficulty treating sleep apnea, and hx of ischemic brain changes when interpreting problems with cognition, motivation, and emotional dyscontrol. Continue abstinence from addictive habits of knife-buying and cross-dressing.  Use other ways of self-soothing, preferably approach [name] for touch time and work on trusting her better to approach, in order to recruit normal sexual interest instead. Self-remind that cross-dressing is an old attempt to feel closer when couldn't; take the urge as a cue to get closer in reality now [place] encouraged to stop sleeping on the floor, return to the bed (if she can do it without  feeling like she is selling out her own protest).  Will encourage also to improve her own sleep habit from 4 hr/night. Continue joint sessions as able and interested Pain/orthopedic Add omega 3 for added antiinflammatory benefit Get adequate activity/movement -- resume walking or stationary bike if it suits Kidney care, including reducing sodas and good fluid by day, hold down late evening fluids Address pain as needed Other health care Maintain diabetic regimen Follow through on ENT assessment to treat problem drainage and sinus congestion, r/o CSF and other neuro-related Check situational BP to help inform physician Check actual expenses before assuming things are too expensive and writing off medical recommendations Fact-check any of the above with physician Dog care workload Endorse large commitment to rescue work, but always OK to reconsider if it's too much Try to work ahead in the day/evening, do a little something when thinking of it, like preload food dishes Try to knock out evening dog care in 1-2 sittings, not interrupt repeatedly and stretch it out May always team up with [name] rather than complain, e.g., for her to preload some feeding and health  tasks so he can more grab and go -- at their discretion.  Also may team up some amount of time in evening care routine, which may go faster for teamwork and energizing him for the task. Switch from yelling to using a clicker or other noisemaker Other recommendations/advice -- As may be noted above.  Continue to utilize previously learned skills ad lib. Medication compliance -- Maintain medication as prescribed and work faithfully with relevant prescriber(s) if any changes are desired or seem indicated. Crisis service -- Aware of call list and work-in appts.  Call the clinic on-call service, 988/hotline, 911, or present to [facility] or ER if any life-threatening psychiatric crisis. Followup -- Return for time as already scheduled.  Next  scheduled visit with me 02/29/2024.  Next scheduled in this office 02/29/2024.  Lamar Kendall, PhD Jodie Kendall, PhD LP Clinical Psychologist, [facility] [address] [place], [place] [address] [phone]

## 2024-02-18 NOTE — Progress Notes (Incomplete)
 Psychotherapy Progress Note [facility] Jodie Kendall, PhD LP  Patient ID: Antonio [name] [name])    MRN: [id number] Therapy format: Family therapy w/ patient -- accompanied by [name] Ask Date: 02/08/2024      Start: 2:06p     Stop: 2:56p     Time Spent: 50 min Location: In-person   Session narrative (presenting needs, interim history, self-report of stressors and symptoms, applications of prior therapy, status changes, and interventions made in session) Says he's had a bad time of it until today.  Did make it to Bible study yesterday.  Ask says he continues very irritable she can hardly stand it.  Probed about the project last time to get on dog care after supper and clear time in the evening for recreation and/or togetherness, Ask makes sure to say he only gave it 2 days and gave up.  Says for himself he forgets, it's hard to pull off but reframed the challenge as opportunity and encouraged him to get back on the horse and try it again.  Bending a habit is about repeatedly restarting the new, better thing until it comes easily.  Challenged to pull the cord again and let tonight be next stab at starting dog care after dinner.    Addressing irritability, again recommended do-overs as positive practice and probed what he could do with a recent conflict.  Generally, advocated for taking actual care of actual bodily problems, too, like pain and congestion to bring down triggers to irritability, while validating that these would tend to make anyone less patient with other things.  Says he has had some vestibular PT, which works some for his dizziness.    [name] presses to go back int o why their sex life failed early and why he's not interested now, saswerting that we keep not returning to the subject.  Cautioned against garbage-canning work done on day to day issues in the quest to voice everything she's dissatisfied with, and keep some room for catching and rewarding  incremental improvements, especially at his age.  Reviewed past interpretations why [name] let intimacy fail and challenged both to focus on the positive and on incremental progress.  Therapeutic modalities: Cognitive Behavioral Therapy, Solution-Oriented/Positive Psychology, Ego-Supportive, and Assertiveness/Communication  Mental Status/Observations:  Appearance:   {PSY:22683}     Behavior:  {PSY:21022743}  Motor:  {PSY:22302}  Speech/Language:   {PSY:22685}  Affect:  {PSY:22687}  Mood:  {PSY:31886}  Thought process:  {PSY:31888}  Thought content:    {[phone]}  Sensory/Perceptual disturbances:    {PSY:2291116487}  Orientation:  {Psych Orientation:23301::Fully oriented}  Attention:  {Good-Fair-Poor ratings:23770::Good}    Concentration:  {Good-Fair-Poor ratings:23770::Good}  Memory:  {PSY:252-429-7810}  Insight:    {Good-Fair-Poor ratings:23770::Good}  Judgment:   {Good-Fair-Poor ratings:23770::Good}  Impulse Control:  {Good-Fair-Poor ratings:23770::Good}   Risk Assessment: Danger to Self: {Risk:22599::No} Self-injurious Behavior: {Risk:22599::No} Danger to Others: {Risk:22599::No} Physical Aggression / Violence: {Risk:22599::No} Duty to Warn: {AMYesNo:22526::No} Access to Firearms a concern: {AMYesNo:22526::No}  Assessment of progress:  {Progress:22147::progressing}  Diagnosis:   ICD-10-CM   1. 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 Inadequate sleep hygiene  Z72.821     4. Relationship problem between partners  Z63.0     5. Type 2 diabetes mellitus with hyperglycemia, unspecified whether long term insulin  use (HCC)  E11.65      Plan:  Sleep hygiene and energy regulation Firm caffeine curfew 4pm -- all forms, reliably  Carb curfew before bed, OK to snack earlier Turn the lights down about 9pm, bring up light as early in the morning as possible Go on to bed by midnight, no  matter what Rise by 10am if at all possible Make sure CPAP is adjusted, clean, and maintained correctly.  Recheck need if desired. Enable earlier bed time by pressing through dog care tasks May try melatonin 1-2 hrs before sleep, 3-5mg .  At least by 10:30pm. Practice 5-7 big, deep breaths in the morning and anytime he needs a refresher, or suspects underbreathing Communication/conflict/relationship Keep trying to get ahead of temper -- say you feel frustrated rather than just play it out.  Options to growl, time out, or something else first. Encourage use of do overs instead of confront and defend dynamics If able, practice saying I'm frustrated or ask what [name] means before reacting to what he believes he hears from her Continue general approach trying to be kinder on the first reaction, giving grace first with [place], don't make it have anything to do with how he feels like he's being treated For encouragement's sake, if desired, ask [name] if she can tell when he is trying Look for opportunities to establish a little quality time together, e.g., shared TV or music, with just touching lightly to get used to it again, e.g., hold hands Pull out old favorite music and activities, including asking [name] to play cool jazz, to help with mood and possibly share [name] make priority of asking/getting [name]'s attention before expecting it -- he is older, sleep-disordered, and long in the habit of self-absorption, plus it's good form anyway.  Likewise try to keep instructions, commentaries, and complaints to few points at once -- he is more likely to react if feeling flooded.  Take into account Pt's age, circadian rhythm problem, chronic difficulty treating sleep apnea, and hx of ischemic brain changes when interpreting problems with cognition, motivation, and emotional dyscontrol. Continue abstinence from addictive habits of knife-buying and cross-dressing.  Use other ways of self-soothing, preferably approach  [name] for touch time and work on trusting her better to approach, in order to recruit normal sexual interest instead. Self-remind that cross-dressing is an old attempt to feel closer when couldn't; take the urge as a cue to get closer in reality now [employer] encouraged to stop sleeping on the floor, return to the bed (if she can do it without feeling like she is selling out her own protest).  Will encourage also to improve her own sleep habit from 4 hr/night. Continue joint sessions as able and interested Pain/orthopedic Add omega 3 for added antiinflammatory benefit Get adequate activity/movement -- resume walking or stationary bike if it suits Kidney care, including reducing sodas and good fluid by day, hold down late evening fluids Address pain as needed Other health care Maintain diabetic regimen Follow through on ENT assessment to treat problem drainage and sinus congestion, r/o CSF and other neuro-related Check situational BP to help inform physician Check actual expenses before assuming things are too expensive and writing off medical recommendations Fact-check any of the above with physician Dog care workload Endorse large commitment to rescue work, but always OK to reconsider if it's too much Try to work ahead in the day/evening, do a little something when thinking of it, like preload food dishes Try to knock out evening dog care in 1-2 sittings, not interrupt repeatedly and stretch it out May always team up with [place] rather than complain, e.g., for her to preload some feeding  and health tasks so he can more grab and go -- at their discretion.  Also may team up some amount of time in evening care routine, which may go faster for teamwork and energizing him for the task. Switch from yelling to using a clicker or other noisemaker Other recommendations/advice -- As may be noted above.  Continue to utilize previously learned skills ad lib. Medication compliance -- Maintain medication as  prescribed and work faithfully with relevant prescriber(s) if any changes are desired or seem indicated. Crisis service -- Aware of call list and work-in appts.  Call the clinic on-call service, 988/hotline, 911, or present to [facility] or ER if any life-threatening psychiatric crisis. Followup -- Return for time as already scheduled.  Next scheduled visit with me 02/29/2024.  Next scheduled in this office 02/29/2024.  Lamar Kendall, PhD Jodie Kendall, PhD LP Clinical Psychologist, [facility] [address] [place], [place] [address] [phone]

## 2024-02-29 ENCOUNTER — Ambulatory Visit: Admitting: Psychiatry

## 2024-02-29 NOTE — Progress Notes (Unsigned)
 Psychotherapy Progress Note [facility] Jodie Kendall, PhD LP  Patient ID: Antonio [name] Antonio [name])    MRN: [id number] Therapy format: {Therapy Types:21967::Individual psychotherapy} Date: 02/29/2024      Start: ***:***     Stop: ***:***     Time Spent: *** min Location: {SvcLoc:22530::In-person}   Session narrative (presenting needs, interim history, self-report of stressors and symptoms, applications of prior therapy, status changes, and interventions made in session) Didn't try again feeding the dogs earlier, same excuses of feling fatigued or forgetting or whaetver.  [name] says she's ready to stop, he's not going to change.  She has turned on the light in the morning bbut added putting her cold, Raynaud's hands on him, which of course gets a reaction.  Coached to just turn the light on, don't try to make sure he's awake yet -- it'll be more realistic, and more humane to herself to just set the stimulus, not make herself responsible.  (At the same time, [name] may actually want the provocation to get going.)    Diabetic advice received   Reviewed tasks he's trying to change and affirmed (1) move midnight m dog miedicine   Therapeutic modalities: {AM:23362::Cognitive Behavioral Therapy,Solution-Oriented/Positive Psychology}  Mental Status/Observations:  Appearance:   {PSY:22683}     Behavior:  {PSY:21022743}  Motor:  {PSY:22302}  Speech/Language:   {PSY:22685}  Affect:  {PSY:22687}  Mood:  {PSY:31886}  Thought process:  {PSY:31888}  Thought content:    {PSY:949-127-8994}  Sensory/Perceptual disturbances:    {PSY:501-494-9937}  Orientation:  {Psych Orientation:23301::Fully oriented}  Attention:  {Good-Fair-Poor ratings:23770::Good}    Concentration:  {Good-Fair-Poor ratings:23770::Good}  Memory:  {[phone]}  Insight:    {Good-Fair-Poor ratings:23770::Good}  Judgment:   {Good-Fair-Poor ratings:23770::Good}  Impulse Control:   {Good-Fair-Poor ratings:23770::Good}   Risk Assessment: Danger to Self: {Risk:22599::No} Self-injurious Behavior: {Risk:22599::No} Danger to Others: {Risk:22599::No} Physical Aggression / Violence: {Risk:22599::No} Duty to Warn: {AMYesNo:22526::No} Access to Firearms a concern: {AMYesNo:22526::No}  Assessment of progress:  {Progress:22147::progressing}  Diagnosis: No diagnosis found. Plan:  *** Other recommendations/advice -- As may be noted above.  Continue to utilize previously learned skills ad lib. Medication compliance -- Maintain medication as prescribed and work faithfully with relevant prescriber(s) if any changes are desired or seem indicated. Crisis service -- Aware of call list and work-in appts.  Call the clinic on-call service, 988/hotline, 911, or present to [facility] or ER if any life-threatening psychiatric crisis. Followup -- No follow-ups on file.  Next scheduled visit with me 03/28/2024.  Next scheduled in this office 03/28/2024.  Lamar Kendall, PhD Jodie Kendall, PhD LP Clinical Psychologist, [facility] [address] [place], [place] [address] [phone]

## 2024-03-28 ENCOUNTER — Ambulatory Visit: Admitting: Psychiatry

## 2024-05-02 ENCOUNTER — Ambulatory Visit: Admitting: Psychiatry

## 2024-05-08 ENCOUNTER — Ambulatory Visit: Admitting: Physician Assistant

## 2024-05-30 ENCOUNTER — Ambulatory Visit: Admitting: Psychiatry
